# Patient Record
Sex: Female | Born: 1960
Health system: Southern US, Community
[De-identification: ages and names within clinical notes are randomized; demographics above are authoritative.]

## PROBLEM LIST (undated history)

## (undated) DIAGNOSIS — T7840XA Allergy, unspecified, initial encounter: Secondary | ICD-10-CM

## (undated) DIAGNOSIS — E785 Hyperlipidemia, unspecified: Secondary | ICD-10-CM

## (undated) DIAGNOSIS — Z9889 Other specified postprocedural states: Secondary | ICD-10-CM

## (undated) DIAGNOSIS — I10 Essential (primary) hypertension: Secondary | ICD-10-CM

## (undated) DIAGNOSIS — M1990 Unspecified osteoarthritis, unspecified site: Secondary | ICD-10-CM

## (undated) DIAGNOSIS — R112 Nausea with vomiting, unspecified: Secondary | ICD-10-CM

## (undated) DIAGNOSIS — K219 Gastro-esophageal reflux disease without esophagitis: Secondary | ICD-10-CM

## (undated) DIAGNOSIS — J449 Chronic obstructive pulmonary disease, unspecified: Secondary | ICD-10-CM

## (undated) DIAGNOSIS — D649 Anemia, unspecified: Secondary | ICD-10-CM

## (undated) HISTORY — DX: Chronic obstructive pulmonary disease, unspecified: J44.9

## (undated) HISTORY — DX: Morbid (severe) obesity due to excess calories: E66.01

## (undated) HISTORY — DX: Allergy, unspecified, initial encounter: T78.40XA

## (undated) HISTORY — DX: Other specified postprocedural states: Z98.890

## (undated) HISTORY — DX: Anemia, unspecified: D64.9

## (undated) HISTORY — PX: UPPER GASTROINTESTINAL ENDOSCOPY: SHX188

## (undated) HISTORY — DX: Gastro-esophageal reflux disease without esophagitis: K21.9

## (undated) HISTORY — DX: Hyperlipidemia, unspecified: E78.5

## (undated) HISTORY — DX: Nausea with vomiting, unspecified: R11.2

## (undated) HISTORY — DX: Unspecified osteoarthritis, unspecified site: M19.90

## (undated) HISTORY — PX: COLONOSCOPY: SHX174

## (undated) HISTORY — DX: Essential (primary) hypertension: I10

---

## 1979-01-27 HISTORY — PX: MULTIPLE TOOTH EXTRACTIONS: SHX2053

## 1988-01-27 HISTORY — PX: TUBAL LIGATION: SHX77

## 1998-12-05 ENCOUNTER — Other Ambulatory Visit: Admission: RE | Admit: 1998-12-05 | Discharge: 1998-12-05 | Payer: Self-pay | Admitting: Internal Medicine

## 1999-12-01 ENCOUNTER — Ambulatory Visit (HOSPITAL_COMMUNITY): Admission: RE | Admit: 1999-12-01 | Discharge: 1999-12-01 | Payer: Self-pay | Admitting: Internal Medicine

## 1999-12-01 ENCOUNTER — Encounter: Payer: Self-pay | Admitting: Internal Medicine

## 2000-12-10 ENCOUNTER — Encounter: Payer: Self-pay | Admitting: Internal Medicine

## 2000-12-10 ENCOUNTER — Ambulatory Visit (HOSPITAL_COMMUNITY): Admission: RE | Admit: 2000-12-10 | Discharge: 2000-12-10 | Payer: Self-pay | Admitting: Internal Medicine

## 2000-12-14 ENCOUNTER — Other Ambulatory Visit: Admission: RE | Admit: 2000-12-14 | Discharge: 2000-12-14 | Payer: Self-pay | Admitting: Internal Medicine

## 2001-02-17 ENCOUNTER — Encounter: Payer: Self-pay | Admitting: Internal Medicine

## 2001-02-17 ENCOUNTER — Ambulatory Visit (HOSPITAL_COMMUNITY): Admission: RE | Admit: 2001-02-17 | Discharge: 2001-02-17 | Payer: Self-pay | Admitting: Internal Medicine

## 2001-05-06 ENCOUNTER — Encounter: Payer: Self-pay | Admitting: Internal Medicine

## 2001-05-06 ENCOUNTER — Ambulatory Visit (HOSPITAL_COMMUNITY): Admission: RE | Admit: 2001-05-06 | Discharge: 2001-05-06 | Payer: Self-pay | Admitting: Internal Medicine

## 2004-01-28 ENCOUNTER — Emergency Department (HOSPITAL_COMMUNITY): Admission: EM | Admit: 2004-01-28 | Discharge: 2004-01-28 | Payer: Self-pay | Admitting: Emergency Medicine

## 2006-01-10 ENCOUNTER — Emergency Department (HOSPITAL_COMMUNITY): Admission: EM | Admit: 2006-01-10 | Discharge: 2006-01-10 | Payer: Self-pay | Admitting: Emergency Medicine

## 2006-05-31 ENCOUNTER — Emergency Department (HOSPITAL_COMMUNITY): Admission: EM | Admit: 2006-05-31 | Discharge: 2006-05-31 | Payer: Self-pay | Admitting: Emergency Medicine

## 2006-11-10 ENCOUNTER — Ambulatory Visit: Payer: Self-pay | Admitting: *Deleted

## 2006-11-10 ENCOUNTER — Observation Stay (HOSPITAL_COMMUNITY): Admission: EM | Admit: 2006-11-10 | Discharge: 2006-11-10 | Payer: Self-pay | Admitting: Emergency Medicine

## 2006-11-11 ENCOUNTER — Encounter (HOSPITAL_COMMUNITY): Admission: RE | Admit: 2006-11-11 | Discharge: 2006-11-12 | Payer: Self-pay | Admitting: Interventional Cardiology

## 2007-03-07 ENCOUNTER — Ambulatory Visit (HOSPITAL_COMMUNITY): Admission: RE | Admit: 2007-03-07 | Discharge: 2007-03-07 | Payer: Self-pay | Admitting: Internal Medicine

## 2007-05-18 ENCOUNTER — Emergency Department (HOSPITAL_COMMUNITY): Admission: EM | Admit: 2007-05-18 | Discharge: 2007-05-18 | Payer: Self-pay | Admitting: Emergency Medicine

## 2008-03-09 ENCOUNTER — Ambulatory Visit (HOSPITAL_COMMUNITY): Admission: RE | Admit: 2008-03-09 | Discharge: 2008-03-09 | Payer: Self-pay | Admitting: Internal Medicine

## 2008-12-30 ENCOUNTER — Emergency Department (HOSPITAL_COMMUNITY): Admission: EM | Admit: 2008-12-30 | Discharge: 2008-12-30 | Payer: Self-pay | Admitting: Family Medicine

## 2009-03-11 ENCOUNTER — Ambulatory Visit (HOSPITAL_COMMUNITY): Admission: RE | Admit: 2009-03-11 | Discharge: 2009-03-11 | Payer: Self-pay | Admitting: Internal Medicine

## 2009-10-14 ENCOUNTER — Ambulatory Visit (HOSPITAL_COMMUNITY): Admission: RE | Admit: 2009-10-14 | Discharge: 2009-10-14 | Payer: Self-pay | Admitting: Gastroenterology

## 2009-11-06 ENCOUNTER — Ambulatory Visit (HOSPITAL_COMMUNITY): Admission: RE | Admit: 2009-11-06 | Discharge: 2009-11-06 | Payer: Self-pay | Admitting: Gastroenterology

## 2010-02-13 ENCOUNTER — Other Ambulatory Visit (HOSPITAL_COMMUNITY): Payer: Self-pay | Admitting: Internal Medicine

## 2010-02-13 DIAGNOSIS — Z0000 Encounter for general adult medical examination without abnormal findings: Secondary | ICD-10-CM

## 2010-02-13 DIAGNOSIS — Z1231 Encounter for screening mammogram for malignant neoplasm of breast: Secondary | ICD-10-CM

## 2010-02-15 ENCOUNTER — Encounter: Payer: Self-pay | Admitting: Internal Medicine

## 2010-02-16 ENCOUNTER — Encounter: Payer: Self-pay | Admitting: Internal Medicine

## 2010-03-11 ENCOUNTER — Ambulatory Visit (HOSPITAL_COMMUNITY): Payer: Self-pay

## 2010-03-11 ENCOUNTER — Ambulatory Visit (HOSPITAL_COMMUNITY): Admission: RE | Admit: 2010-03-11 | Payer: Self-pay | Source: Home / Self Care | Admitting: Internal Medicine

## 2010-03-13 ENCOUNTER — Ambulatory Visit (HOSPITAL_COMMUNITY): Admission: RE | Admit: 2010-03-13 | Payer: Self-pay | Source: Ambulatory Visit

## 2010-03-18 ENCOUNTER — Ambulatory Visit (HOSPITAL_COMMUNITY)
Admission: RE | Admit: 2010-03-18 | Discharge: 2010-03-18 | Disposition: A | Discharge status: Home / Self Care | Payer: 59 | Source: Ambulatory Visit | Attending: Internal Medicine | Admitting: Internal Medicine

## 2010-03-18 DIAGNOSIS — Z1231 Encounter for screening mammogram for malignant neoplasm of breast: Secondary | ICD-10-CM | POA: Insufficient documentation

## 2010-06-10 NOTE — H&P (Signed)
Marissa Cox, Marissa Cox             ACCOUNT NO.:  [id number]   MEDICAL RECORD NO.:  [id number]          PATIENT TYPE:  OBV   LOCATION:  6522                         FACILITY:  [facility]   PHYSICIAN:  Audery Amel, MD    DATE OF BIRTH:  20-Apr-1960   DATE OF ADMISSION:  11/10/2006  DATE OF DISCHARGE:  11/10/2006                              HISTORY & PHYSICAL   CHIEF COMPLAINT:  Chest pain.   HISTORY OF PRESENT ILLNESS:  The patient is a 50 year old African  American female with a history of hypertension who presents to the  emergency department for evaluation of chest pain.  The patient states  that the symptoms began this afternoon after she left work.  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  This afternoon, she  characterized the onset of this as a very sharp chest pain located in  the substernal region.  She denied any radiation to the left arm, neck,  jaw or back.  The patient stated that she has had similar symptoms in  the past when she has had muscle strains; however, this seemed to be  somewhat more severe.  She presented to the emergency department for  further evaluation.  EKG was obtained which revealed normal sinus rhythm  with no evidence of acute injury or ischemia.  Her point of care cardiac  biomarkers were negative as well as her serum cardiac biomarkers.  The  pain spontaneously improved, and is currently 2/10.  She does note that  the pain is worse with deep inspiration, and with moving of her left arm  and shoulder.  Of note, there is a palpable component which does  reproduce her symptoms.  Otherwise she is without complaints.   PAST MEDICAL HISTORY:  Hypertension.   CURRENT MEDICATIONS:  1. Lisinopril 20 mg once daily.  2. Aspirin 81 mg daily.   SOCIAL HISTORY:  The patient lives in [place] with her husband.  The  patient states she smokes approximately one half-pack of  cigarettes  daily.  She has occasional alcohol.  She denies any illicit substances.   FAMILY HISTORY:  There is no history of coronary artery disease on  either side of her family.  There is a general history of hypertension.   REVIEW OF SYSTEMS:  The patient denies any PND, orthopnea, or symptoms  concerning for heart failure.  Otherwise, review of systems is negative  except as documented in the HPI.   PHYSICAL EXAMINATION:  VITAL SIGNS:  Blood pressure 100/54, heart rate  67, O2 sats are 99% on room air.  GENERAL:  The patient is obese, alert and x3, no acute distress,  pleasantly conversant.  NECK:  Supple, full range of motion, no appreciable JVD.  There is no  palpable thyromegaly or lymphadenopathy.  Carotid upstrokes are equal  and symmetric bilaterally with no audible bruits.  CHEST:  Clear to auscultation bilaterally.  There is a palpable chest  wall discomfort along the left sternal border.  CARDIOVASCULAR:  Normal S1-S2 with  no audible murmurs, rubs or gallops.  ABDOMEN:  Obese, soft, nontender, nondistended.  Positive bowel sounds.  No hepatosplenomegaly.  EXTREMITIES:  Show no clubbing, cyanosis or edema.  Peripheral pulses  are 1+ and symmetric bilaterally.  There is no rash or serrations noted  on her extremities.  NEUROLOGIC:  Examination is grossly nonfocal.  Strength and sensation  are intact throughout.  PSYCHIATRIC:  Appropriate insight and judgment.   STUDIES:  Chest X-Ray:  No acute cardiopulmonary process.   EKG normal sinus rhythm with no evidence of acute injury or ischemia.   LABORATORY DATA:  Sodium 139, potassium 3.9, chloride 104, CO2 is 28,  BUN is 17, creatinine 1.0.  point care biomarkers negative times one.  Serum cardiac biomarkers revealed Troponin I 0.02 and CK-MB of 1.5.   IMPRESSION:  1. Atypical chest pain.  2. Hypertension.   PLAN:  Will admit the patient to telemetry for further evaluation and  monitoring.  We will cycle her cardiac  biomarkers to rule out myocardial  infarction.  My suspicion of an acute coronary syndrome is very low and  given the palpable nature of her chest discomfort, suspect this is  musculoskeletal pain.  We will continue her lisinopril 20 mg once daily  for hypertension as well as aspirin 81 mg daily.  If the patient rules  out for myocardial infarction, the need for further risk stratification  can be determined at that time.      Audery Amel, MD  Electronically Signed     SHG/MEDQ  D:  11/10/2006  T:  11/10/2006  Job:  [id number]

## 2010-06-13 NOTE — Consult Note (Signed)
NAMEJANEANE, COZART NO.:  [id number]   MEDICAL RECORD NO.:  [id number]          PATIENT TYPE:  EMS   LOCATION:  MAJO                         FACILITY:  [facility]   PHYSICIAN:  Lonia Blood, M.D.DATE OF BIRTH:  January 16, 1961   DATE OF CONSULTATION:  01/10/2006  DATE OF DISCHARGE:                                 CONSULTATION   PRIMARY CARE PHYSICIAN:  Merlene Laughter. Renae Gloss, M.D.   REASON FOR CONSULTATION:  Chest pain.   HISTORY OF PRESENT ILLNESS:  Ms. Delecia Vastine is a very pleasant 49-  year-old female with no history of coronary artery disease, no personal  history of diabetes, and no significant family history of early coronary  artery disease.  She is a pack-a-day smoker, is obese, has unknown lipid  status, and is hypertensive.  She was in her usual state of health until  yesterday evening around 5 o'clock.  She had been up working about the  house.  She sat on the couch to relax.  Shortly after sitting down, she  began to experience midsternal stabbing, sharp chest pain.  This was  worse with deep breaths and movements and at times made her catch her  breath.  There was no diaphoresis.  There was no dyspnea on exertion  and no exertional component to her pain whatsoever.  The pain at times  would radiate into the right shoulder as well as the left shoulder.  The  pain was constant after its onset and has not resolved since.  Because  of the severity of her pain and the fact that she had never experienced  pain like this, the patient presented to the emergency room for  evaluation.  In the emergency room, an EKG has revealed normal sinus  rhythm with no acute changes.  Point-of-care markers have been negative.  A D-dimer was negative.  The patient's pain is reproducible to palpation  over the chest.  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   REVIEW OF SYSTEMS:  Comprehensive review of systems is unremarkable,  with the exception of the multiple positive elements noted in the  history of present illness above.   PAST MEDICAL HISTORY:  1. Hypertension.  2. Tobacco abuse with a minimum of one-pack-per day since 50 years      old.   MEDICATIONS:  Lisinopril 10 mg daily.   ALLERGIES:  NO KNOWN DRUG ALLERGIES.   FAMILY HISTORY:  The patient's mother died of cancer of the bladder at  age 53.  The patient's father is alive and well at 58 with diabetes.  The patient has five siblings total, none of whom have a history of  coronary artery disease.   SOCIAL HISTORY:  The patient lives in [place].  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  She lives with  her husband.  Her alcohol intake is negligible.   DATA REVIEWED:  D-dimer is less than 0.22.  pH 7.37, PCO2 47.  Sodium,  potassium, chloride,  bicarb, BUN, creatinine, and serum glucose are  normal. Myoglobin, CK-MB, and troponin I are all negative.   12-lead EKG reveals normal sinus rhythm at 75 beats per minute with no  acute ST or T wave changes.  Chest x-ray reveals no acute disease.   PHYSICAL EXAMINATION:  VITAL SIGNS:  Temperature 98.3, blood pressure  147/69, heart rate 80, respiratory rate 20, O2 saturation 100% on room  air.  GENERAL:  Well-developed, well-nourished, obese female in no acute  respiratory distress.  HEENT:  Normocephalic, atraumatic.  Pupils equal, round, reactive to  light and accommodation.  Extraocular muscles intact.  OC/OP  clear.  NECK:  No JVD.  LUNGS:  Clear to auscultation bilaterally without wheezes or rhonchi.  CARDIOVASCULAR:  Regular rate and rhythm without murmurs, gallops, or  rubs.  Normal S1 and S2.  ABDOMEN:  Nontender, nondistended, soft.  Bowel sounds present.  No  hepatosplenomegaly.  No rebound.  No ascites.  EXTREMITIES:  No significant clubbing, cyanosis, or edema in bilateral   lower extremities.  NEUROLOGIC:  5/5 strength in bilateral upper and lower extremities.  Intact sensation to touch throughout.  Alert and oriented x4.  Cranial  nerves II-XII are intact bilaterally.  MUSCULOSKELETAL:  There is reproducible pain with palpation over the  sternum.  With palpation, this sharp, stabbing pain radiates into the  right and left shoulders simultaneously.   IMPRESSION AND PLAN:  1. Musculoskeletal chest pain.  Ms. Aisling Emigh pain does not in      any way resemble angina.  She has a normal electrocardiogram and      normal point-of-care markers x1.  Her cardiac risk factors include      hypertension, tobacco abuse, and obesity.  She has an unknown lipid      status.  She does not have a significant family history, and she is      not diabetic.  In the absence of significant electrocardiographic      findings, positive enzymes, or history consistent with angina, I do      not feel that there is anything to be offered to Ms. Seppala by      admitted her to the hospital for risk stratification.  Her history      is also not consistent with esophageal rupture or perforated peptic      ulcer.  There is no free air in the diaphragm.  With a completely      negative D-dimer and a history not consistent with a deep venous      thrombosis or pulmonary embolism, this is also highly unlikely.      What seems to be most likely is the fact that the patient has      suffered a musculoskeletal strain/sprain of the intercostal muscles      and possibly even at the rib and sternum junction.  I have advised      her that she should refrain from repetitive motions and lifting as      required by her work for a period of at least 3 to 5 days.  She is      advised to follow up with Dr. Renae Gloss in the outpatient setting to      ultimately be cleared to return to work.  I will place her on      Motrin at 200 mg t.i.d. in an attempt to actually decrease     inflammation.  I have  explained to her that she can  only take this      for 5 days straight due to the possibility for gastrointestinal      upset as well as renal insufficiency.  I have advised her that she      should take it with food with each dose.  I have also prescribed      for her Percocet 5/325 to be taken 1 to 2 every 6 hours as needed      for severe pain.  I have explained to her that this will not in      fact assist with recovery of the injured muscle/tissue but that it      will simply mask the pain and hopefully allow her to sleep or rest.  2. Obesity.  I have discussed with Ms. Hise the advisability of a      weight loss program.  3. Tobacco abuse.  I have counseled Ms. Griffeth as to the multiple      deleterious effects of ongoing tobacco abuse.  I have advised her      to discontinue use immediately.  4. Hypertension.  The patient's blood pressure is reasonably      controlled given the current clinical situation.  I will not adjust      her medications.   It has been my pleasure to see Ms. Fly in the emergency room.  I  do not feel that she needs inpatient hospitalization.  I am discharging  her home to follow up with Dr. Andi Devon in 3 to 5 days for a  recheck of musculoskeletal chest pain.      Lonia Blood, M.D.  Electronically Signed     JTM/MEDQ  D:  01/10/2006  T:  01/10/2006  Job:  [id number]   cc:   Merlene Laughter. Renae Gloss, M.D.

## 2010-10-21 LAB — POCT URINALYSIS DIP (DEVICE)
Glucose, UA: NEGATIVE
Operator id: 239701
Protein, ur: 30 — AB
Specific Gravity, Urine: 1.015
pH: 6.5

## 2010-11-05 LAB — BASIC METABOLIC PANEL
BUN: 16
GFR calc Af Amer: >60
Glucose, Bld: 89
Potassium: 3.7

## 2010-11-05 LAB — DIFFERENTIAL
Basophils Relative: 0
Eosinophils Absolute: 0.1
Eosinophils Relative: 2
Lymphocytes Relative: 32
Lymphs Abs: 1.7
Monocytes Absolute: 0.3
Monocytes Relative: 6
Neutro Abs: 3.3
Neutrophils Relative %: 60

## 2010-11-05 LAB — POCT CARDIAC MARKERS
CKMB, poc: <1 — ABNORMAL LOW
Myoglobin, poc: 83.7
Operator id: 277751
Troponin i, poc: <0.05

## 2010-11-05 LAB — CBC
HCT: 33.3 — ABNORMAL LOW
MCV: 74.3 — ABNORMAL LOW
Platelets: 238
RDW: 13.4

## 2010-11-05 LAB — LIPID PANEL: HDL: 39 — ABNORMAL LOW

## 2010-11-05 LAB — CK TOTAL AND CKMB (NOT AT ARMC)
CK, MB: 1.3
Relative Index: 0.8
Total CK: 163

## 2010-11-05 LAB — TROPONIN I: Troponin I: 0.02

## 2010-11-06 LAB — I-STAT 8, (EC8 V) (CONVERTED LAB)
Acid-Base Excess: 3 — ABNORMAL HIGH
BUN: 17
Bicarbonate: 28.3 — ABNORMAL HIGH
Chloride: 104
Operator id: 272551
TCO2: 30

## 2010-11-06 LAB — CBC
HCT: 34.6 — ABNORMAL LOW
Hemoglobin: 10.9 — ABNORMAL LOW
MCV: 74.1 — ABNORMAL LOW
RBC: 4.67
WBC: 7.4

## 2010-11-06 LAB — CK TOTAL AND CKMB (NOT AT ARMC)
CK, MB: 1.5
Relative Index: 0.8

## 2010-11-06 LAB — DIFFERENTIAL
Eosinophils Relative: 1
Lymphocytes Relative: 31
Neutrophils Relative %: 61

## 2010-11-06 LAB — POCT CARDIAC MARKERS
Myoglobin, poc: 58.9
Myoglobin, poc: 74.6
Operator id: 277751

## 2010-11-06 LAB — D-DIMER, QUANTITATIVE: D-Dimer, Quant: <0.22

## 2010-11-06 LAB — POCT I-STAT CREATININE: Operator id: 272551

## 2011-02-27 ENCOUNTER — Other Ambulatory Visit (HOSPITAL_COMMUNITY): Payer: Self-pay | Admitting: Internal Medicine

## 2011-02-27 DIAGNOSIS — Z1231 Encounter for screening mammogram for malignant neoplasm of breast: Secondary | ICD-10-CM

## 2011-03-26 ENCOUNTER — Ambulatory Visit (HOSPITAL_COMMUNITY)
Admission: RE | Admit: 2011-03-26 | Discharge: 2011-03-26 | Disposition: A | Discharge status: Home / Self Care | Payer: 59 | Source: Ambulatory Visit | Attending: Internal Medicine | Admitting: Internal Medicine

## 2011-03-26 DIAGNOSIS — Z1231 Encounter for screening mammogram for malignant neoplasm of breast: Secondary | ICD-10-CM | POA: Insufficient documentation

## 2011-08-07 ENCOUNTER — Encounter (INDEPENDENT_AMBULATORY_CARE_PROVIDER_SITE_OTHER): Payer: Self-pay | Admitting: General Surgery

## 2011-08-07 ENCOUNTER — Ambulatory Visit (INDEPENDENT_AMBULATORY_CARE_PROVIDER_SITE_OTHER): Payer: Commercial Managed Care - PPO | Admitting: General Surgery

## 2011-08-07 ENCOUNTER — Ambulatory Visit (INDEPENDENT_AMBULATORY_CARE_PROVIDER_SITE_OTHER): Payer: 59 | Admitting: General Surgery

## 2011-08-07 DIAGNOSIS — I10 Essential (primary) hypertension: Secondary | ICD-10-CM

## 2011-08-07 DIAGNOSIS — Z6841 Body Mass Index (BMI) 40.0 and over, adult: Secondary | ICD-10-CM

## 2011-08-07 NOTE — Progress Notes (Signed)
Patient ID: Marissa Cox, female   DOB: Jun 23, 1960, 51 y.o.   MRN: [id number]  Chief Complaint  Patient presents with  . Bariatric Pre-op    sleeve initial    HPI Marissa Cox is a 51 y.o. female.  This patient has a BMI of 45 with comorbidities of hypertension, hyperlipidemia, and bronchitis and presents for her initial weight loss surgery evaluation. She has a tender information session and is most interested in the sleeve gastrectomy. She is troubled with wait for a while and has tried several diets including Weight Watchers, Nutrisystem, and physician's weight loss and has always regained the weight. She says that she is walking now about 30 minutes per day as well as doing the Wii Fit. She denies any reflux. She does have a history of smoking but has arty consult with her primary physician regarding this and has not smoked since July 2 but is currently on nicotine patches. HPI  Past Medical History  Diagnosis Date  . Hyperlipidemia   . Hypertension     Past Surgical History  Procedure Date  . Tubal ligation 1990    Family History  Problem Relation Age of Onset  . Cancer Mother     bladder  . Cancer Maternal Aunt     breast  . Cancer Paternal Aunt     breast  . Cancer Paternal Aunt     breast  . Cancer Paternal Aunt     breast  . Cancer Maternal Aunt     ovarian    Social History History  Substance Use Topics  . Smoking status: Former Games developer  . Smokeless tobacco: Former Neurosurgeon    Quit date: 08/07/2011  . Alcohol Use: No    No Known Allergies  Current Outpatient Prescriptions  Medication Sig Dispense Refill  . albuterol (PROVENTIL) (2.5 MG/3ML) 0.083% nebulizer solution Take 2.5 mg by nebulization every 6 (six) hours as needed.      . cholecalciferol (VITAMIN D) 1000 UNITS tablet Take 2,000 Units by mouth daily.      Marland Kitchen ezetimibe-simvastatin (VYTORIN) 10-20 MG per tablet Take 1 tablet by mouth at bedtime.      Marland Kitchen levocetirizine (XYZAL) 5 MG tablet Take 5  mg by mouth every evening.      Marland Kitchen lisinopril-hydrochlorothiazide (PRINZIDE,ZESTORETIC) 20-12.5 MG per tablet Take 1 tablet by mouth daily.      . nicotine (NICODERM CQ - DOSED IN MG/24 HOURS) 21 mg/24hr patch Place 1 patch onto the skin daily.      . nicotine (NICOTROL) 10 MG inhaler Inhale 1 puff into the lungs as needed.      Marland Kitchen albuterol (PROVENTIL HFA;VENTOLIN HFA) 108 (90 BASE) MCG/ACT inhaler Inhale 2 puffs into the lungs as needed.        Review of Systems Review of Systems All other review of systems negative or noncontributory except as stated in the HPI  Blood pressure 145/86, pulse 75, temperature 97.5 F (36.4 C), temperature source Temporal, resp. rate 16, height 5\' 7"  (1.702 m), weight 293 lb (132.904 kg), SpO2 98.00%.  Physical Exam Physical Exam Physical Exam  Nursing note and vitals reviewed. Constitutional: She is oriented to person, place, and time. She appears well-developed and well-nourished. No distress.  HENT:  Head: Normocephalic and atraumatic.  Mouth/Throat: No oropharyngeal exudate.  Eyes: Conjunctivae and EOM are normal. Pupils are equal, round, and reactive to light. Right eye exhibits no discharge. Left eye exhibits no discharge. No scleral icterus.  Neck: Normal range of  motion. Neck supple. No tracheal deviation present.  Cardiovascular: Normal rate, regular rhythm, normal heart sounds and intact distal pulses.   Pulmonary/Chest: Effort normal and breath sounds normal. No stridor. No respiratory distress. She has no wheezes.  Abdominal: Soft. Bowel sounds are normal. She exhibits no distension and no mass. There is no tenderness. There is no rebound and no guarding.  Musculoskeletal: Normal range of motion. She exhibits no edema and no tenderness.  Neurological: She is alert and oriented to person, place, and time.  Skin: Skin is warm and dry. No rash noted. She is not diaphoretic. No erythema. No pallor.  Psychiatric: She has a normal mood and affect.  Her behavior is normal. Judgment and thought content normal.    Data Reviewed   Assessment    Morbid obesity with a BMI of 45 and comorbidities of hypertension, hyperlipidemia, and bronchitis She has a history of smoking but is currently in the process of quitting. I again explained our policy of not performing surgery on current smokers and she agreed to continue with her smoking cessation. We discussed the increased risks of complications such as leaks in patients that are smoking. We also discussed all of the nonsurgical and surgical weight loss options including the. Laband, sleeve gastrectomy, and Roux-en-Y gastric bypass. She is most interested in the sleeve gastrectomy. The risks of infection, bleeding, pain, scarring, weight regain, too little or too much weight loss, vitamin deficiencies and need for lifelong vitamin supplementation, hair loss, need for protein supplementation, leaks, stricture, reflux, food intolerance, need for reoperation and conversion to roux Y gastric bypass, need for open surgery, injury to spleen or surrounding structures, DVT's, PE, and death again discussed with the patient and the patient expressed understanding and desires to proceed with laparoscopic vertical sleeve gastrectomy, possible open, intraoperative endoscopy.     Plan    We will start her on her preoperative workup including laboratory studies, upper GI, psychology and nutrition evaluations. She says that she is current on her mammogram and Pap smear and other preventive maintenance       Marissa Cox DAVID 08/07/2011, 1:05 PM

## 2011-08-10 LAB — CBC WITH DIFFERENTIAL/PLATELET
Basophils Absolute: 0 10*3/uL (ref 0.0–0.2)
Eosinophils Absolute: 0.1 10*3/uL (ref 0.0–0.4)
Immature Grans (Abs): 0 10*3/uL (ref 0.0–0.1)
Immature Granulocytes: 0 % (ref 0–2)
MCH: 22.7 pg — ABNORMAL LOW (ref 26.6–33.0)
MCHC: 30.6 g/dL — ABNORMAL LOW (ref 31.5–35.7)
MCV: 74 fL — ABNORMAL LOW (ref 79–97)
Monocytes Absolute: 0.4 10*3/uL (ref 0.1–1.0)
Neutrophils Relative %: 50 % (ref 40–74)
RDW: 13.7 % (ref 12.3–15.4)

## 2011-08-10 LAB — COMPREHENSIVE METABOLIC PANEL
ALT: 22 IU/L (ref 0–32)
AST: 23 IU/L (ref 0–40)
Albumin/Globulin Ratio: 1.3 (ref 1.1–2.5)
CO2: 26 mmol/L (ref 19–28)
Calcium: 8.9 mg/dL (ref 8.7–10.2)
GFR calc non Af Amer: 77 mL/min/{1.73_m2} (ref >59)
Potassium: 3.8 mmol/L (ref 3.5–5.2)
Sodium: 141 mmol/L (ref 134–144)

## 2011-08-10 LAB — LIPID PANEL: VLDL Cholesterol Cal: 14 mg/dL (ref 5–40)

## 2011-08-11 LAB — H. PYLORI ANTIBODY, IGG: H Pylori IgG: <0.9 U/mL (ref 0.0–0.8)

## 2011-08-26 ENCOUNTER — Encounter: Payer: 59 | Attending: General Surgery | Admitting: *Deleted

## 2011-08-26 ENCOUNTER — Encounter: Payer: Self-pay | Admitting: *Deleted

## 2011-08-26 DIAGNOSIS — Z713 Dietary counseling and surveillance: Secondary | ICD-10-CM | POA: Insufficient documentation

## 2011-08-26 DIAGNOSIS — Z01818 Encounter for other preprocedural examination: Secondary | ICD-10-CM | POA: Insufficient documentation

## 2011-08-26 NOTE — Patient Instructions (Signed)
   Follow Pre-Op Nutrition Goals to prepare for Gastric Sleeve Surgery.   Call [facility] at [phone] once you have been given your surgery date to enrolled in the Pre-Op Nutrition Class. You will need to attend this nutrition class 3-4 weeks prior to your surgery.  

## 2011-08-26 NOTE — Progress Notes (Signed)
  Pre-Op Assessment Visit:  Pre-Operative Gastric Sleeve Surgery  Medical Nutrition Therapy:  Appt start time: 0830   End time:  0930.  Patient was seen on 08/26/2011 for Pre-Operative Gastric Sleeve Nutrition Assessment. Assessment and letter of approval faxed to [facility] Bariatric Surgery Program coordinator on 08/26/2011.  Approval letter sent to [facility] Scan center and will be available in the chart under the media tab.  TANITA  BODY COMP RESULTS  08/26/11   %Fat 51.8%   Fat Mass (lbs) 154.5   Fat Free Mass (lbs) 144.0   Total Body Water (lbs) 105.5   Handouts given during visit include:  Pre-Op Goals   Bariatric Surgery Protein Shakes  Patient to call for Pre-Op and Post-Op Nutrition Education at the [facility] when surgery is scheduled.

## 2011-09-02 ENCOUNTER — Ambulatory Visit (HOSPITAL_COMMUNITY)
Admission: RE | Admit: 2011-09-02 | Discharge: 2011-09-02 | Disposition: A | Discharge status: Home / Self Care | Payer: 59 | Source: Ambulatory Visit | Attending: General Surgery | Admitting: General Surgery

## 2011-09-02 DIAGNOSIS — Z6841 Body Mass Index (BMI) 40.0 and over, adult: Secondary | ICD-10-CM | POA: Insufficient documentation

## 2011-09-02 DIAGNOSIS — I10 Essential (primary) hypertension: Secondary | ICD-10-CM | POA: Insufficient documentation

## 2011-09-02 DIAGNOSIS — E785 Hyperlipidemia, unspecified: Secondary | ICD-10-CM | POA: Insufficient documentation

## 2011-11-12 ENCOUNTER — Other Ambulatory Visit (INDEPENDENT_AMBULATORY_CARE_PROVIDER_SITE_OTHER): Payer: Self-pay

## 2011-11-12 DIAGNOSIS — Z01812 Encounter for preprocedural laboratory examination: Secondary | ICD-10-CM

## 2011-11-12 DIAGNOSIS — Z720 Tobacco use: Secondary | ICD-10-CM

## 2011-11-13 ENCOUNTER — Encounter (INDEPENDENT_AMBULATORY_CARE_PROVIDER_SITE_OTHER): Payer: Self-pay

## 2011-11-13 ENCOUNTER — Telehealth (INDEPENDENT_AMBULATORY_CARE_PROVIDER_SITE_OTHER): Payer: Self-pay

## 2011-11-13 NOTE — Telephone Encounter (Signed)
Patient will have her Nicotine test today, she also reports that she's no longer wearing the nicotine patch.

## 2011-11-13 NOTE — Telephone Encounter (Signed)
Lab orders for Nicotine Testing mailed to patient.  Per Dr. Biagio Quint patient must have Negative results or patient Weight Loss Surgery will be cancelled.

## 2011-11-16 LAB — NICOTINE/COTININE METABOLITES: Cotinine: <10 ng/mL

## 2011-11-20 ENCOUNTER — Other Ambulatory Visit (INDEPENDENT_AMBULATORY_CARE_PROVIDER_SITE_OTHER): Payer: Self-pay | Admitting: General Surgery

## 2011-11-20 DIAGNOSIS — E669 Obesity, unspecified: Secondary | ICD-10-CM

## 2011-12-03 ENCOUNTER — Encounter: Payer: 59 | Attending: General Surgery | Admitting: *Deleted

## 2011-12-03 ENCOUNTER — Ambulatory Visit: Payer: 59

## 2011-12-03 ENCOUNTER — Telehealth (INDEPENDENT_AMBULATORY_CARE_PROVIDER_SITE_OTHER): Payer: Self-pay

## 2011-12-03 DIAGNOSIS — Z713 Dietary counseling and surveillance: Secondary | ICD-10-CM | POA: Insufficient documentation

## 2011-12-03 DIAGNOSIS — Z01818 Encounter for other preprocedural examination: Secondary | ICD-10-CM | POA: Insufficient documentation

## 2011-12-03 NOTE — Telephone Encounter (Signed)
FMLA forms signed by Dr. Biagio Quint and faxed to [place], Human Resources @ [place] [phone].  Confirmation attached to forms and sent to medical records to be scanned in patient chart.

## 2011-12-05 ENCOUNTER — Encounter: Payer: Self-pay | Admitting: *Deleted

## 2011-12-05 NOTE — Progress Notes (Addendum)
Bariatric Class:  Appt start time: 1730 end time:  1830.  Pre-Operative Nutrition Class  Patient was seen on 12/03/11 for Pre-Operative Bariatric Surgery Education at [facility].   Surgery date: 12/22/11 Surgery type: Gastric Sleeve Start weight at [facility]: 298.5 lbs (08/26/11) Weight today: 312.6 lbs  Samples given per MNT protocol: Bariatric Advantage Complete Multivitamin Lot # [id number]; [id number] Exp: 12/13; 06/15  Bariatric Advantage Calcium Citrate Lot # [id number] Exp:12/13  Celebrate Vitamins Multivitamin Lot # [id number] Exp: 09/14  Celebrate Vitamins Multivitamin Complete Lot # [id number] Exp: 11/14  Celebrate Vitamins Calcium Citrate Lot # [id number] Exp:09/15  Celebrate Vitamins Sublingual B12 Lot # [id number] Exp: 05/15  Corliss Marcus Protein Powder Lot # [id number] Exp: 03/15  The following the learning objective met by the patient during this course:  Identifies Pre-Op Dietary Goals and will begin 2 weeks pre-operatively  Identifies appropriate sources of fluids and proteins   States protein recommendations and appropriate sources pre and post-operatively  Identifies Post-Operative Dietary Goals and will follow for 2 weeks post-operatively  Identifies appropriate multivitamin and calcium sources  Describes the need for physical activity post-operatively and will follow MD recommendations  States when to call healthcare provider regarding medication questions or post-operative complications  Handouts given during class include:  Pre-Op Bariatric Surgery Diet Handout  Protein Shake Handout  Post-Op Bariatric Surgery Nutrition Handout  BELT Program Information Flyer  Support Group Information Flyer  [facility] Bariatric Supplements Price List  Follow-Up Plan: Patient will follow-up at [facility] 2 weeks post operatively for diet advancement per MD.

## 2011-12-05 NOTE — Patient Instructions (Signed)
Follow:   Pre-Op Diet per MD 2 weeks prior to surgery  Phase 2- Liquids (clear/full) 2 weeks after surgery  Vitamin/Mineral/Calcium guidelines for purchasing bariatric supplements  Exercise guidelines pre and post-op per MD  Follow-up at [facility] in 2 weeks post-op for diet advancement. Contact Rhylin Venters as needed with questions/concerns. 

## 2011-12-14 ENCOUNTER — Encounter (HOSPITAL_COMMUNITY): Payer: Self-pay | Admitting: Pharmacy Technician

## 2011-12-16 ENCOUNTER — Encounter (INDEPENDENT_AMBULATORY_CARE_PROVIDER_SITE_OTHER): Payer: Self-pay | Admitting: General Surgery

## 2011-12-16 ENCOUNTER — Telehealth (INDEPENDENT_AMBULATORY_CARE_PROVIDER_SITE_OTHER): Payer: Self-pay | Admitting: General Surgery

## 2011-12-16 ENCOUNTER — Ambulatory Visit (INDEPENDENT_AMBULATORY_CARE_PROVIDER_SITE_OTHER): Payer: Commercial Managed Care - PPO | Admitting: General Surgery

## 2011-12-16 ENCOUNTER — Telehealth (INDEPENDENT_AMBULATORY_CARE_PROVIDER_SITE_OTHER): Payer: Self-pay

## 2011-12-16 ENCOUNTER — Other Ambulatory Visit (INDEPENDENT_AMBULATORY_CARE_PROVIDER_SITE_OTHER): Payer: Self-pay | Admitting: General Surgery

## 2011-12-16 DIAGNOSIS — Z6841 Body Mass Index (BMI) 40.0 and over, adult: Secondary | ICD-10-CM

## 2011-12-16 MED ORDER — OXYCODONE HCL 5 MG/5ML PO SOLN: 5.0000 mg | ORAL | Status: DC | PRN

## 2011-12-16 MED ORDER — ONDANSETRON 4 MG PO TBDP: 4.0000 mg | ORAL_TABLET | Freq: Three times a day (TID) | ORAL | Status: DC | PRN

## 2011-12-16 NOTE — Telephone Encounter (Signed)
Pt calling to ask about postop meds.  Her planned discharge will fall on Thanksgiving Day, so pt is asking if she can get her medicine filled earlier.  She uses the [facility] OP pharmacy:  [phone].  Please let her know.

## 2011-12-16 NOTE — Telephone Encounter (Signed)
Patient notified that her post op prescription for pain medication will be at check in for her to pick up.

## 2011-12-16 NOTE — Progress Notes (Signed)
Patient ID: Marissa Cox, female   DOB: 06/04/1960, 51 y.o.   MRN: [id number]  No chief complaint on file.   HPI Marissa Cox is a 51 y.o. female.  This patient presents for her preoperative evaluation for vertical sleeve gastrectomy. She has a BMI of 45 with comorbidities of hypertension, hyperlipidemia, and bronchitis and is interested in sleeve gastrectomy. Since her initial visit she has completely stop smoking and has been smoke-free for 5 months and she feels well. She has been walking and doing her we fit it has lost about 9 pounds on the preoperative diet HPI  Past Medical History  Diagnosis Date  . Hyperlipidemia   . Hypertension     Past Surgical History  Procedure Date  . Tubal ligation 1990    Family History  Problem Relation Age of Onset  . Cancer Mother     bladder  . Cancer Maternal Aunt     breast  . Cancer Paternal Aunt     breast  . Cancer Paternal Aunt     breast  . Cancer Paternal Aunt     breast  . Cancer Maternal Aunt     ovarian    Social History History  Substance Use Topics  . Smoking status: Former Smoker  . Smokeless tobacco: Former User    Quit date: 08/07/2011  . Alcohol Use: No    No Known Allergies  Current Outpatient Prescriptions  Medication Sig Dispense Refill  . cholecalciferol (VITAMIN D) 1000 UNITS tablet Take 2,000 Units by mouth daily.      . ezetimibe-simvastatin (VYTORIN) 10-20 MG per tablet Take 1 tablet by mouth at bedtime.      . lisinopril-hydrochlorothiazide (PRINZIDE,ZESTORETIC) 20-12.5 MG per tablet Take 1 tablet by mouth daily before breakfast.         Review of Systems Review of Systems All other review of systems negative or noncontributory except as stated in the HPI  There were no vitals taken for this visit.  Physical Exam Physical Exam Physical Exam  Nursing note and vitals reviewed. Constitutional: She is oriented to person, place, and time. She appears well-developed and well-nourished. No  distress.  HENT:  Head: Normocephalic and atraumatic.  Mouth/Throat: No oropharyngeal exudate.  Eyes: Conjunctivae and EOM are normal. Pupils are equal, round, and reactive to light. Right eye exhibits no discharge. Left eye exhibits no discharge. No scleral icterus.  Neck: Normal range of motion. Neck supple. No tracheal deviation present.  Cardiovascular: Normal rate, regular rhythm, normal heart sounds and intact distal pulses.   Pulmonary/Chest: Effort normal and breath sounds normal. No stridor. No respiratory distress. She has no wheezes.  Abdominal: Soft. Bowel sounds are normal. She exhibits no distension and no mass. There is no tenderness. There is no rebound and no guarding.  Musculoskeletal: Normal range of motion. She exhibits no edema and no tenderness.  Neurological: She is alert and oriented to person, place, and time.  Skin: Skin is warm and dry. No rash noted. She is not diaphoretic. No erythema. No pallor.  Psychiatric: She has a normal mood and affect. Her behavior is normal. Judgment and thought content normal.    Data Reviewed   Assessment    Morbid obesity with a BMI of 45 and comorbidities of hypertension and hyperlipidemia She has completed her necessary workup and evaluation for vertical sleeve gastrectomy. She seems excited for this new change in her life and is on the preoperative diet. She has stopped smoking in her nicotine test   has been negative. We again discussed the procedure and the pros and cons and risks and benefits of vertical sleeve gastrectomy.The risks of infection, bleeding, pain, scarring, weight regain, too little or too much weight loss, vitamin deficiencies and need for lifelong vitamin supplementation, hair loss, need for protein supplementation, leaks, stricture, reflux, food intolerance, need for reoperation and conversion to roux Y gastric bypass, need for open surgery, injury to spleen or surrounding structures, DVT's, PE, and death again  discussed with the patient and the patient expressed understanding and desires to proceed with laparoscopic vertical sleeve gastrectomy, possible open, intraoperative endoscopy.     Plan    We will plan for laparoscopic vertical sleeve gastrectomy possible open next week.       Lajarvis Italiano DAVID 12/16/2011, 1:43 PM    

## 2011-12-17 ENCOUNTER — Encounter (HOSPITAL_COMMUNITY): Payer: Self-pay

## 2011-12-17 ENCOUNTER — Ambulatory Visit (HOSPITAL_COMMUNITY)
Admission: RE | Admit: 2011-12-17 | Discharge: 2011-12-17 | Disposition: A | Discharge status: Home / Self Care | Payer: 59 | Source: Ambulatory Visit | Attending: General Surgery | Admitting: General Surgery

## 2011-12-17 ENCOUNTER — Encounter (HOSPITAL_COMMUNITY)
Admission: RE | Admit: 2011-12-17 | Discharge: 2011-12-17 | Disposition: A | Discharge status: Home / Self Care | Payer: 59 | Source: Ambulatory Visit | Attending: General Surgery | Admitting: General Surgery

## 2011-12-17 ENCOUNTER — Ambulatory Visit: Payer: 59

## 2011-12-17 VITALS — BP 114/62 | HR 60 | Temp 97.6°F | Resp 18 | Ht 67.0 in | Wt 304.0 lb

## 2011-12-17 DIAGNOSIS — E669 Obesity, unspecified: Secondary | ICD-10-CM

## 2011-12-17 DIAGNOSIS — Z01812 Encounter for preprocedural laboratory examination: Secondary | ICD-10-CM | POA: Insufficient documentation

## 2011-12-17 DIAGNOSIS — Z01818 Encounter for other preprocedural examination: Secondary | ICD-10-CM | POA: Insufficient documentation

## 2011-12-17 LAB — COMPREHENSIVE METABOLIC PANEL
BUN: 27 mg/dL — ABNORMAL HIGH (ref 6–23)
CO2: 26 mEq/L (ref 19–32)
Chloride: 97 mEq/L (ref 96–112)
Creatinine, Ser: 0.91 mg/dL (ref 0.50–1.10)
GFR calc non Af Amer: 71 mL/min — ABNORMAL LOW (ref >90)
Glucose, Bld: 100 mg/dL — ABNORMAL HIGH (ref 70–99)
Total Bilirubin: 0.4 mg/dL (ref 0.3–1.2)

## 2011-12-17 LAB — CBC WITH DIFFERENTIAL/PLATELET
Eosinophils Relative: 2 % (ref 0–5)
HCT: 36.3 % (ref 36.0–46.0)
Lymphs Abs: 1.3 10*3/uL (ref 0.7–4.0)
MCV: 72 fL — ABNORMAL LOW (ref 78.0–100.0)
Monocytes Relative: 9 % (ref 3–12)
Neutro Abs: 1.9 10*3/uL (ref 1.7–7.7)
RBC: 5.04 MIL/uL (ref 3.87–5.11)
RDW: 13.4 % (ref 11.5–15.5)
WBC: 3.6 10*3/uL — ABNORMAL LOW (ref 4.0–10.5)

## 2011-12-17 LAB — SURGICAL PCR SCREEN: MRSA, PCR: NEGATIVE

## 2011-12-17 NOTE — Patient Instructions (Signed)
20 Marissa Cox  12/17/2011   Your procedure is scheduled on:  Tuesday 12-22-2011  Report to [facility] at  [phone]  Call this number if you have problems the morning of surgery: [phone]   Remember:   Do not drink liquids or  eat food:After Midnight. Monday night 12-21-2011      Take these medicines the morning of surgery with A SIP OF WATER: None   Do not wear jewelry, make-up or nail polish.  Do not wear lotions, powders, or perfumes. You may wear deodorant.  Do not shave 48 hours prior to surgery. Men may shave face and neck.  Do not bring valuables to the hospital.  Contacts, dentures or bridgework may not be worn into surgery.  Leave suitcase in the car. After surgery it may be brought to your room.  For patients admitted to the hospital, checkout time is 11:00 AM the day of discharge.   Patients discharged the day of surgery will not be allowed to drive home.  Name and phone number of your driver: Virjean Boman spouse [phone]  See [facility] Preparing for surgery sheet.   Please read over the following fact sheets that you were given: MRSA Information

## 2011-12-22 ENCOUNTER — Encounter (HOSPITAL_COMMUNITY): Payer: Self-pay | Admitting: *Deleted

## 2011-12-22 ENCOUNTER — Encounter (HOSPITAL_COMMUNITY): Payer: Self-pay | Admitting: Anesthesiology

## 2011-12-22 ENCOUNTER — Encounter (HOSPITAL_COMMUNITY)
Admission: RE | Disposition: A | Discharge status: Home / Self Care | Payer: Self-pay | Source: Ambulatory Visit | Attending: General Surgery

## 2011-12-22 ENCOUNTER — Inpatient Hospital Stay (HOSPITAL_COMMUNITY)
Admission: RE | Admit: 2011-12-22 | Discharge: 2011-12-24 | DRG: 621 | Disposition: A | Discharge status: Home / Self Care | Payer: 59 | Source: Ambulatory Visit | Attending: General Surgery | Admitting: General Surgery

## 2011-12-22 ENCOUNTER — Ambulatory Visit (HOSPITAL_COMMUNITY): Payer: 59 | Admitting: Anesthesiology

## 2011-12-22 DIAGNOSIS — I10 Essential (primary) hypertension: Secondary | ICD-10-CM

## 2011-12-22 DIAGNOSIS — E785 Hyperlipidemia, unspecified: Secondary | ICD-10-CM | POA: Diagnosis present

## 2011-12-22 DIAGNOSIS — Z79899 Other long term (current) drug therapy: Secondary | ICD-10-CM

## 2011-12-22 DIAGNOSIS — Z87891 Personal history of nicotine dependence: Secondary | ICD-10-CM

## 2011-12-22 DIAGNOSIS — E669 Obesity, unspecified: Secondary | ICD-10-CM

## 2011-12-22 DIAGNOSIS — K319 Disease of stomach and duodenum, unspecified: Secondary | ICD-10-CM | POA: Diagnosis present

## 2011-12-22 DIAGNOSIS — Z6841 Body Mass Index (BMI) 40.0 and over, adult: Secondary | ICD-10-CM

## 2011-12-22 HISTORY — PX: UPPER GI ENDOSCOPY: SHX6162

## 2011-12-22 HISTORY — PX: LAPAROSCOPIC GASTRIC SLEEVE RESECTION: SHX5895

## 2011-12-22 LAB — MISCELLANEOUS TEST

## 2011-12-22 SURGERY — GASTRECTOMY, SLEEVE, LAPAROSCOPIC
Anesthesia: General | Wound class: Clean Contaminated

## 2011-12-22 MED ORDER — LISINOPRIL 20 MG PO TABS
20.0000 mg | ORAL_TABLET | Freq: Every day | ORAL | Status: DC
  Administered 2011-12-23: 20 mg via ORAL
  Filled 2011-12-22 (×2): qty 1

## 2011-12-22 MED ORDER — KCL IN DEXTROSE-NACL 20-5-0.45 MEQ/L-%-% IV SOLN
INTRAVENOUS | Status: DC
  Administered 2011-12-22 – 2011-12-24 (×6): via INTRAVENOUS
  Filled 2011-12-22 (×8): qty 1000

## 2011-12-22 MED ORDER — HYDROMORPHONE HCL PF 1 MG/ML IJ SOLN
0.2500 mg | INTRAMUSCULAR | Status: DC | PRN
  Administered 2011-12-22 (×2): 0.5 mg via INTRAVENOUS

## 2011-12-22 MED ORDER — LISINOPRIL-HYDROCHLOROTHIAZIDE 20-12.5 MG PO TABS: 1.0000 | ORAL_TABLET | Freq: Every day | ORAL | Status: DC

## 2011-12-22 MED ORDER — HYDROMORPHONE HCL PF 1 MG/ML IJ SOLN
INTRAMUSCULAR | Status: AC
  Filled 2011-12-22: qty 1

## 2011-12-22 MED ORDER — 0.9 % SODIUM CHLORIDE (POUR BTL) OPTIME
TOPICAL | Status: DC | PRN
  Administered 2011-12-22: 1000 mL

## 2011-12-22 MED ORDER — OXYCODONE-ACETAMINOPHEN 5-325 MG/5ML PO SOLN
5.0000 mL | ORAL | Status: DC | PRN
  Administered 2011-12-23 – 2011-12-24 (×5): 5 mL via ORAL
  Filled 2011-12-22 (×5): qty 5

## 2011-12-22 MED ORDER — PROPOFOL 10 MG/ML IV BOLUS
INTRAVENOUS | Status: DC | PRN
  Administered 2011-12-22: 150 mg via INTRAVENOUS

## 2011-12-22 MED ORDER — ACETAMINOPHEN 10 MG/ML IV SOLN
INTRAVENOUS | Status: DC | PRN
  Administered 2011-12-22: 1000 mg via INTRAVENOUS

## 2011-12-22 MED ORDER — LIDOCAINE-EPINEPHRINE 1 %-1:100000 IJ SOLN
INTRAMUSCULAR | Status: DC | PRN
  Administered 2011-12-22: 17 mL

## 2011-12-22 MED ORDER — BUPIVACAINE HCL 0.25 % IJ SOLN
INTRAMUSCULAR | Status: DC | PRN
  Administered 2011-12-22: 17 mL

## 2011-12-22 MED ORDER — BUPIVACAINE HCL (PF) 0.25 % IJ SOLN
INTRAMUSCULAR | Status: AC
  Filled 2011-12-22: qty 30

## 2011-12-22 MED ORDER — ONDANSETRON HCL 4 MG/2ML IJ SOLN
INTRAMUSCULAR | Status: DC | PRN
  Administered 2011-12-22: 4 mg via INTRAVENOUS

## 2011-12-22 MED ORDER — ROCURONIUM BROMIDE 100 MG/10ML IV SOLN
INTRAVENOUS | Status: DC | PRN
  Administered 2011-12-22: 20 mg via INTRAVENOUS
  Administered 2011-12-22: 10 mg via INTRAVENOUS
  Administered 2011-12-22: 45 mg via INTRAVENOUS

## 2011-12-22 MED ORDER — MORPHINE SULFATE 2 MG/ML IJ SOLN
2.0000 mg | INTRAMUSCULAR | Status: DC | PRN
  Administered 2011-12-22 (×2): 2 mg via INTRAVENOUS
  Administered 2011-12-22 – 2011-12-23 (×6): 4 mg via INTRAVENOUS
  Filled 2011-12-22 (×3): qty 2
  Filled 2011-12-22: qty 1
  Filled 2011-12-22 (×4): qty 2

## 2011-12-22 MED ORDER — TISSEEL VH 10 ML EX KIT
PACK | CUTANEOUS | Status: DC | PRN
  Administered 2011-12-22: 10 mL

## 2011-12-22 MED ORDER — LIDOCAINE-EPINEPHRINE 1 %-1:100000 IJ SOLN
INTRAMUSCULAR | Status: AC
  Filled 2011-12-22: qty 1

## 2011-12-22 MED ORDER — SODIUM CHLORIDE 0.9 % IV SOLN
1.0000 g | INTRAVENOUS | Status: AC
  Administered 2011-12-22: 1 g via INTRAVENOUS

## 2011-12-22 MED ORDER — SODIUM CHLORIDE 0.9 % IV SOLN
INTRAVENOUS | Status: AC
  Filled 2011-12-22: qty 1

## 2011-12-22 MED ORDER — ACETAMINOPHEN 10 MG/ML IV SOLN
INTRAVENOUS | Status: AC
  Filled 2011-12-22: qty 100

## 2011-12-22 MED ORDER — UNJURY VANILLA POWDER: 2.0000 [oz_av] | Freq: Four times a day (QID) | ORAL | Status: DC

## 2011-12-22 MED ORDER — HYDROCHLOROTHIAZIDE 12.5 MG PO CAPS
12.5000 mg | ORAL_CAPSULE | Freq: Every day | ORAL | Status: DC
  Administered 2011-12-23: 12.5 mg via ORAL
  Filled 2011-12-22 (×2): qty 1

## 2011-12-22 MED ORDER — HEPARIN SODIUM (PORCINE) 5000 UNIT/ML IJ SOLN
5000.0000 [IU] | Freq: Once | INTRAMUSCULAR | Status: AC
  Administered 2011-12-22: 5000 [IU] via SUBCUTANEOUS
  Filled 2011-12-22: qty 1

## 2011-12-22 MED ORDER — ONDANSETRON HCL 4 MG/2ML IJ SOLN
4.0000 mg | INTRAMUSCULAR | Status: DC | PRN
  Administered 2011-12-22 – 2011-12-23 (×4): 4 mg via INTRAVENOUS
  Filled 2011-12-22 (×5): qty 2

## 2011-12-22 MED ORDER — PROMETHAZINE HCL 25 MG/ML IJ SOLN: 6.2500 mg | INTRAMUSCULAR | Status: DC | PRN

## 2011-12-22 MED ORDER — DEXAMETHASONE SODIUM PHOSPHATE 10 MG/ML IJ SOLN
INTRAMUSCULAR | Status: DC | PRN
  Administered 2011-12-22: 10 mg via INTRAVENOUS

## 2011-12-22 MED ORDER — PROMETHAZINE HCL 25 MG/ML IJ SOLN
25.0000 mg | Freq: Four times a day (QID) | INTRAMUSCULAR | Status: DC | PRN
  Administered 2011-12-22: 25 mg via INTRAMUSCULAR
  Filled 2011-12-22: qty 1

## 2011-12-22 MED ORDER — GLYCOPYRROLATE 0.2 MG/ML IJ SOLN
INTRAMUSCULAR | Status: DC | PRN
  Administered 2011-12-22: .8 mg via INTRAVENOUS

## 2011-12-22 MED ORDER — LACTATED RINGERS IV SOLN
INTRAVENOUS | Status: DC | PRN
  Administered 2011-12-22 (×3): via INTRAVENOUS

## 2011-12-22 MED ORDER — UNJURY CHOCOLATE CLASSIC POWDER: 2.0000 [oz_av] | Freq: Four times a day (QID) | ORAL | Status: DC

## 2011-12-22 MED ORDER — FENTANYL CITRATE 0.05 MG/ML IJ SOLN
INTRAMUSCULAR | Status: DC | PRN
  Administered 2011-12-22 (×2): 50 ug via INTRAVENOUS
  Administered 2011-12-22: 100 ug via INTRAVENOUS

## 2011-12-22 MED ORDER — ACETAMINOPHEN 160 MG/5ML PO SOLN: 650.0000 mg | ORAL | Status: DC | PRN

## 2011-12-22 MED ORDER — SUCCINYLCHOLINE CHLORIDE 20 MG/ML IJ SOLN
INTRAMUSCULAR | Status: DC | PRN
  Administered 2011-12-22: 100 mg via INTRAVENOUS

## 2011-12-22 MED ORDER — UNJURY CHICKEN SOUP POWDER: 2.0000 [oz_av] | Freq: Four times a day (QID) | ORAL | Status: DC

## 2011-12-22 MED ORDER — MIDAZOLAM HCL 5 MG/5ML IJ SOLN
INTRAMUSCULAR | Status: DC | PRN
  Administered 2011-12-22: 2 mg via INTRAVENOUS

## 2011-12-22 MED ORDER — ENOXAPARIN SODIUM 40 MG/0.4ML ~~LOC~~ SOLN
40.0000 mg | SUBCUTANEOUS | Status: DC
  Administered 2011-12-23: 40 mg via SUBCUTANEOUS
  Filled 2011-12-22 (×3): qty 0.4

## 2011-12-22 MED ORDER — TISSEEL VH 10 ML EX KIT
PACK | CUTANEOUS | Status: AC
  Filled 2011-12-22: qty 1

## 2011-12-22 MED ORDER — NEOSTIGMINE METHYLSULFATE 1 MG/ML IJ SOLN
INTRAMUSCULAR | Status: DC | PRN
  Administered 2011-12-22: 5 mg via INTRAVENOUS

## 2011-12-22 MED ORDER — LACTATED RINGERS IV SOLN: INTRAVENOUS | Status: DC

## 2011-12-22 SURGICAL SUPPLY — 59 items
ADH SKN CLS APL DERMABOND .7 (GAUZE/BANDAGES/DRESSINGS) ×2
APL SRG 32X5 SNPLK LF DISP (MISCELLANEOUS) ×1
APPLICATOR COTTON TIP 6IN STRL (MISCELLANEOUS) IMPLANT
APPLIER CLIP ROT 10 11.4 M/L (STAPLE) ×3
APR CLP MED LRG 11.4X10 (STAPLE) ×1
BAG SPEC RTRVL LRG 6X4 10 (ENDOMECHANICALS)
CABLE HIGH FREQUENCY MONO STRZ (ELECTRODE) IMPLANT
CANISTER SUCTION 2500CC (MISCELLANEOUS) ×3 IMPLANT
CHLORAPREP W/TINT 26ML (MISCELLANEOUS) ×6 IMPLANT
CLIP APPLIE ROT 10 11.4 M/L (STAPLE) ×2 IMPLANT
CLOTH BEACON ORANGE TIMEOUT ST (SAFETY) ×3 IMPLANT
COVER SURGICAL LIGHT HANDLE (MISCELLANEOUS) IMPLANT
DERMABOND ADVANCED (GAUZE/BANDAGES/DRESSINGS) ×1
DERMABOND ADVANCED .7 DNX12 (GAUZE/BANDAGES/DRESSINGS) ×2 IMPLANT
DEVICE SUTURE ENDOST 10MM (ENDOMECHANICALS) IMPLANT
DRAIN CHANNEL 19F RND (DRAIN) ×3 IMPLANT
DRAPE LAPAROSCOPIC ABDOMINAL (DRAPES) ×3 IMPLANT
ELECT REM PT RETURN 9FT ADLT (ELECTROSURGICAL) ×3
ELECTRODE REM PT RTRN 9FT ADLT (ELECTROSURGICAL) ×2 IMPLANT
EVACUATOR DRAINAGE 10X20 100CC (DRAIN) ×2 IMPLANT
EVACUATOR SILICONE 100CC (DRAIN) ×3
GLOVE BIO SURGEON STRL SZ7.5 (GLOVE) ×6 IMPLANT
GLOVE SURG SS PI 7.5 STRL IVOR (GLOVE) ×6 IMPLANT
GOWN STRL NON-REIN LRG LVL3 (GOWN DISPOSABLE) ×9 IMPLANT
GOWN STRL REIN XL XLG (GOWN DISPOSABLE) ×9 IMPLANT
HANDLE STAPLE EGIA 4 XL (STAPLE) ×3 IMPLANT
HOVERMATT SINGLE USE (MISCELLANEOUS) ×3 IMPLANT
KIT BASIN OR (CUSTOM PROCEDURE TRAY) ×3 IMPLANT
MARKER SKIN DUAL TIP RULER LAB (MISCELLANEOUS) ×3 IMPLANT
NEEDLE SPNL 22GX3.5 QUINCKE BK (NEEDLE) ×3 IMPLANT
NS IRRIG 1000ML POUR BTL (IV SOLUTION) ×3 IMPLANT
PENCIL BUTTON HOLSTER BLD 10FT (ELECTRODE) ×3 IMPLANT
POUCH SPECIMEN RETRIEVAL 10MM (ENDOMECHANICALS) IMPLANT
RELOAD BLACK 60MM ECHELON (STAPLE) ×6 IMPLANT
RELOAD EGIA 60 MED/THCK PURPLE (STAPLE) ×12 IMPLANT
RELOAD GREEN (STAPLE) IMPLANT
RELOAD TRI 2.0 60 XTHK VAS SUL (STAPLE) IMPLANT
SCISSORS LAP 5X35 DISP (ENDOMECHANICALS) ×3 IMPLANT
SEALANT SURGICAL APPL DUAL CAN (MISCELLANEOUS) ×3 IMPLANT
SET IRRIG TUBING LAPAROSCOPIC (IRRIGATION / IRRIGATOR) ×3 IMPLANT
SHEARS CURVED HARMONIC AC 45CM (MISCELLANEOUS) ×3 IMPLANT
SLEEVE XCEL OPT CAN 5 100 (ENDOMECHANICALS) ×6 IMPLANT
SOLUTION ANTI FOG 6CC (MISCELLANEOUS) ×3 IMPLANT
SPONGE GAUZE 4X4 12PLY (GAUZE/BANDAGES/DRESSINGS) ×3 IMPLANT
SPONGE LAP 18X18 X RAY DECT (DISPOSABLE) ×3 IMPLANT
STAPLE ECHEON FLEX 60 POW ENDO (STAPLE) IMPLANT
STRIP PERI DRY VERITAS 60 (STAPLE) IMPLANT
SUT ETHILON 2 0 PS N (SUTURE) ×3 IMPLANT
SUT MNCRL AB 4-0 PS2 18 (SUTURE) ×6 IMPLANT
SUT VICRYL 0 UR6 27IN ABS (SUTURE) ×3 IMPLANT
SYR 50ML LL SCALE MARK (SYRINGE) ×3 IMPLANT
TAPE CLOTH SURG 4X10 WHT LF (GAUZE/BANDAGES/DRESSINGS) ×3 IMPLANT
TRAY FOLEY CATH 14FRSI W/METER (CATHETERS) ×3 IMPLANT
TRAY LAP CHOLE (CUSTOM PROCEDURE TRAY) ×3 IMPLANT
TROCAR BLADELESS 15MM (ENDOMECHANICALS) ×3 IMPLANT
TROCAR BLADELESS OPT 5 100 (ENDOMECHANICALS) ×3 IMPLANT
TUBING CONNECTING 10 (TUBING) ×3 IMPLANT
TUBING ENDO SMARTCAP (MISCELLANEOUS) ×3 IMPLANT
TUBING FILTER THERMOFLATOR (ELECTROSURGICAL) ×3 IMPLANT

## 2011-12-22 NOTE — Transfer of Care (Signed)
Immediate Anesthesia Transfer of Care Note  Patient: Marissa Cox  Procedure(s) Performed: Procedure(s) (LRB) with comments: LAPAROSCOPIC GASTRIC SLEEVE RESECTION (N/A) UPPER GI ENDOSCOPY ()  Patient Location: PACU  Anesthesia Type:General  Level of Consciousness: awake, sedated and patient cooperative  Airway & Oxygen Therapy: Patient Spontanous Breathing and Patient connected to face mask oxygen  Post-op Assessment: Report given to PACU RN and Post -op Vital signs reviewed and stable  Post vital signs: Reviewed and stable  Complications: No apparent anesthesia complications

## 2011-12-22 NOTE — Interval H&P Note (Signed)
History and Physical Interval Note:  12/22/2011 7:14 AM  Hubert Azure  has presented today for surgery, with the diagnosis of MORBID OBESITY  The various methods of treatment have been discussed with the patient and family. After consideration of risks, benefits and other options for treatment, the patient has consented to  Procedure(s) (LRB) with comments: LAPAROSCOPIC GASTRIC SLEEVE RESECTION (N/A) as a surgical intervention .  The patient's history has been reviewed, patient examined, no change in status, stable for surgery.  I have reviewed the patient's chart and labs.  Questions were answered to the patient's satisfaction.  Pt. Seen in preop area.  She denies any smoking.  Risks and benefits of sleeve gastrectomy again discussed in lay terms.  The risks of infection, bleeding, pain, scarring, weight regain, too little or too much weight loss, vitamin deficiencies and need for lifelong vitamin supplementation, hair loss, need for protein supplementation, leaks, stricture, reflux, food intolerance, need for reoperation and conversion to roux Y gastric bypass, need for open surgery, injury to spleen or surrounding structures, DVT's, PE, and death again discussed with the patient and the patient expressed understanding and desires to proceed with laparoscopic vertical sleeve gastrectomy, possible open, intraoperative endoscopy.    Lodema Pilot DAVID

## 2011-12-22 NOTE — H&P (View-Only) (Signed)
Patient ID: Marissa Cox, female   DOB: Mar 23, 1960, 51 y.o.   MRN: [id number]  No chief complaint on file.   HPI Marissa Cox is a 51 y.o. female.  This patient presents for her preoperative evaluation for vertical sleeve gastrectomy. She has a BMI of 45 with comorbidities of hypertension, hyperlipidemia, and bronchitis and is interested in sleeve gastrectomy. Since her initial visit she has completely stop smoking and has been smoke-free for 5 months and she feels well. She has been walking and doing her we fit it has lost about 9 pounds on the preoperative diet HPI  Past Medical History  Diagnosis Date  . Hyperlipidemia   . Hypertension     Past Surgical History  Procedure Date  . Tubal ligation 1990    Family History  Problem Relation Age of Onset  . Cancer Mother     bladder  . Cancer Maternal Aunt     breast  . Cancer Paternal Aunt     breast  . Cancer Paternal Aunt     breast  . Cancer Paternal Aunt     breast  . Cancer Maternal Aunt     ovarian    Social History History  Substance Use Topics  . Smoking status: Former Games developer  . Smokeless tobacco: Former Neurosurgeon    Quit date: 08/07/2011  . Alcohol Use: No    No Known Allergies  Current Outpatient Prescriptions  Medication Sig Dispense Refill  . cholecalciferol (VITAMIN D) 1000 UNITS tablet Take 2,000 Units by mouth daily.      Marland Kitchen ezetimibe-simvastatin (VYTORIN) 10-20 MG per tablet Take 1 tablet by mouth at bedtime.      Marland Kitchen lisinopril-hydrochlorothiazide (PRINZIDE,ZESTORETIC) 20-12.5 MG per tablet Take 1 tablet by mouth daily before breakfast.         Review of Systems Review of Systems All other review of systems negative or noncontributory except as stated in the HPI  There were no vitals taken for this visit.  Physical Exam Physical Exam Physical Exam  Nursing note and vitals reviewed. Constitutional: She is oriented to person, place, and time. She appears well-developed and well-nourished. No  distress.  HENT:  Head: Normocephalic and atraumatic.  Mouth/Throat: No oropharyngeal exudate.  Eyes: Conjunctivae and EOM are normal. Pupils are equal, round, and reactive to light. Right eye exhibits no discharge. Left eye exhibits no discharge. No scleral icterus.  Neck: Normal range of motion. Neck supple. No tracheal deviation present.  Cardiovascular: Normal rate, regular rhythm, normal heart sounds and intact distal pulses.   Pulmonary/Chest: Effort normal and breath sounds normal. No stridor. No respiratory distress. She has no wheezes.  Abdominal: Soft. Bowel sounds are normal. She exhibits no distension and no mass. There is no tenderness. There is no rebound and no guarding.  Musculoskeletal: Normal range of motion. She exhibits no edema and no tenderness.  Neurological: She is alert and oriented to person, place, and time.  Skin: Skin is warm and dry. No rash noted. She is not diaphoretic. No erythema. No pallor.  Psychiatric: She has a normal mood and affect. Her behavior is normal. Judgment and thought content normal.    Data Reviewed   Assessment    Morbid obesity with a BMI of 45 and comorbidities of hypertension and hyperlipidemia She has completed her necessary workup and evaluation for vertical sleeve gastrectomy. She seems excited for this new change in her life and is on the preoperative diet. She has stopped smoking in her nicotine test  has been negative. We again discussed the procedure and the pros and cons and risks and benefits of vertical sleeve gastrectomy.The risks of infection, bleeding, pain, scarring, weight regain, too little or too much weight loss, vitamin deficiencies and need for lifelong vitamin supplementation, hair loss, need for protein supplementation, leaks, stricture, reflux, food intolerance, need for reoperation and conversion to roux Y gastric bypass, need for open surgery, injury to spleen or surrounding structures, DVT's, PE, and death again  discussed with the patient and the patient expressed understanding and desires to proceed with laparoscopic vertical sleeve gastrectomy, possible open, intraoperative endoscopy.     Plan    We will plan for laparoscopic vertical sleeve gastrectomy possible open next week.       Lodema Pilot DAVID 12/16/2011, 1:43 PM

## 2011-12-22 NOTE — Anesthesia Postprocedure Evaluation (Signed)
  Anesthesia Post-op Note  Patient: Marissa Cox  Procedure(s) Performed: Procedure(s) (LRB): LAPAROSCOPIC GASTRIC SLEEVE RESECTION (N/A) UPPER GI ENDOSCOPY ()  Patient Location: PACU  Anesthesia Type: General  Level of Consciousness: awake and alert   Airway and Oxygen Therapy: Patient Spontanous Breathing  Post-op Pain: mild  Post-op Assessment: Post-op Vital signs reviewed, Patient's Cardiovascular Status Stable, Respiratory Function Stable, Patent Airway and No signs of Nausea or vomiting  Last Vitals:  Filed Vitals:   12/22/11 1045  BP: 148/73  Pulse: 75  Temp:   Resp: 18    Post-op Vital Signs: stable   Complications: No apparent anesthesia complications

## 2011-12-22 NOTE — Anesthesia Preprocedure Evaluation (Signed)
Anesthesia Evaluation  Patient identified by MRN, date of birth, ID band Patient awake    Reviewed: Allergy & Precautions, H&P , NPO status , Patient's Chart, lab work & pertinent test results, reviewed documented beta blocker date and time   Airway Mallampati: II TM Distance: >3 FB Neck ROM: full    Dental No notable dental hx.    Pulmonary neg pulmonary ROS,  breath sounds clear to auscultation  Pulmonary exam normal       Cardiovascular Exercise Tolerance: Good hypertension, On Medications Rhythm:regular Rate:Normal     Neuro/Psych negative neurological ROS  negative psych ROS   GI/Hepatic negative GI ROS, Neg liver ROS,   Endo/Other  Morbid obesity  Renal/GU negative Renal ROS  negative genitourinary   Musculoskeletal   Abdominal (+) + obese,   Peds  Hematology negative hematology ROS (+)   Anesthesia Other Findings   Reproductive/Obstetrics negative OB ROS                           Anesthesia Physical Anesthesia Plan  ASA: III  Anesthesia Plan: General   Post-op Pain Management:    Induction: Intravenous  Airway Management Planned: Oral ETT  Additional Equipment:   Intra-op Plan:   Post-operative Plan: Extubation in OR  Informed Consent: I have reviewed the patients History and Physical, chart, labs and discussed the procedure including the risks, benefits and alternatives for the proposed anesthesia with the patient or authorized representative who has indicated his/her understanding and acceptance.   Dental Advisory Given  Plan Discussed with: CRNA  Anesthesia Plan Comments:         Anesthesia Quick Evaluation

## 2011-12-22 NOTE — Brief Op Note (Signed)
12/22/2011  9:44 AM  PATIENT:  Marissa Cox  51 y.o. female  PRE-OPERATIVE DIAGNOSIS:  MORBID OBESITY  POST-OPERATIVE DIAGNOSIS:  Morbid Obesity  PROCEDURE:  Procedure(s) (LRB) with comments: LAPAROSCOPIC GASTRIC SLEEVE RESECTION (N/A) UPPER GI ENDOSCOPY ()  SURGEON:  Surgeon(s) and Role:    * Lodema Pilot, DO - Primary    * Valarie Merino, MD  PHYSICIAN ASSISTANT:   ASSISTANTS: martin   ANESTHESIA:   general  EBL:  Total I/O In: 2000 [I.V.:2000] Out: 225 [Urine:225]  BLOOD ADMINISTERED:none  DRAINS: (86F) Jackson-Pratt drain(s) with closed bulb suction in the along the sleeve   LOCAL MEDICATIONS USED:  MARCAINE    and LIDOCAINE   SPECIMEN:  Source of Specimen:  sleeve gastrectomy with incidentally found gastric mass  DISPOSITION OF SPECIMEN:  PATHOLOGY  COUNTS:  YES  TOURNIQUET:  * No tourniquets in log *  DICTATION: .Other Dictation: Dictation Number [id number]  PLAN OF CARE: Admit to inpatient   PATIENT DISPOSITION:  PACU - hemodynamically stable.   Delay start of Pharmacological VTE agent (>24hrs) due to surgical blood loss or risk of bleeding: no

## 2011-12-22 NOTE — Op Note (Signed)
Marissa Cox, Marissa Cox             ACCOUNT NO.:  [id number]  MEDICAL RECORD NO.:  [id number]  LOCATION:  1532                         FACILITY:  [facility]  PHYSICIAN:  Lodema Pilot, MD       DATE OF BIRTH:  12/16/1960  DATE OF PROCEDURE:  12/22/2011 DATE OF DISCHARGE:                              OPERATIVE REPORT   PROCEDURE:  Laparoscopic vertical sleeve gastrectomy with intraoperative upper endoscopy.  PREOPERATIVE DIAGNOSIS:  Morbid obesity.  POSTOP DIAGNOSIS:  Morbid obesity.  SURGEON:  Lodema Pilot, MD  ASSISTANT:  Dr. Daphine Deutscher.  ANESTHESIA:  General endotracheal anesthesia with of 35 mL of 1% lidocaine with epinephrine and 0.25% Marcaine in a 50:50 mixture.  FLUIDS:  2500 mL crystalloid.  ESTIMATED BLOOD LOSS:  50 mL.  DRAINS:  A 19-French Blake drain was placed along the gastric sleeve staple line.  SPECIMENS:  Sleeve gastrectomy with gastric mass, suture marking the mass.  COMPLICATIONS:  None apparent.  FINDINGS:  Vertical sleeve gastrectomy created with 36-French bougie. Incidentally found 1 cm gastric mass excised with the greater curvature of the stomach.  Suture marking the tumor and sent to Pathology for permanent section.  Tisseel glue placed along the stable line and 19- French drain placed along the staple lines.  INDICATION FOR PROCEDURE:  Marissa Cox is a 51 year old female with BMI of 45%, hypertension, arthritis, and failed medical weight loss attempts and in need of durable weight loss solution.  OPERATIVE DETAILS:  Marissa Cox is seen and evaluated in the preoperative area and risks and benefits of procedure were discussed again in lay terms.  Informed consent was obtained.  She was given prophylactic antibiotics and subcutaneous heparin and was taken to the operating room, and general endotracheal anesthesia obtained.  Foley catheter was placed, and her abdomen was prepped and draped in a standard surgical fashion.  The 5-mm Optiview trocar  was used to access the peritoneum in the left upper quadrant.  The pneumoperitoneum was obtained.  Then a 5-mm left rectus port was placed and a 15-mm right rectus port and a 5 mm right upper quadrant port were placed under direct visualization.  Nathanson liver retractor was passed through epigastric stab incisions to retract the left lobe of the liver.  She was noted to have a small gastric masses from the greater curvature of the stomach, which was going to be resected with the specimen.  This was likely a small leiomyoma or GIST.  I measured 5 cm from the pylorus and started dividing the short gastric vessels.  Carried this dissection up around the greater curve of the stomach and up to the spleen, completely divided the short gastric vessels up to the left crus after the stomach was completely mobilized and rolled the stomach over and there was no significant posterior gastric attachments.  We were ready to start dividing the stomach.  First firing of the stapler taken with a 60 mm black load stapler, taking care not to completely transect the stomach or narrow the stomach near the incisure and a second black stapler was placed on the stomach in the anticipated area of dissection, and medial to the gastric a 36-French bougie was passed under direct  visualization into the duodenum along the lesser curvature of the stomach, and the second firing was taken with a black 60 mm stapler and then transitioned to a purple Tri-Staple loads, marching up along the bougie up towards the angle of His, taking care to avoid spiralling of the staple line and Christmas tree formation.  As I approached the angle of His, I angled off the bougie to ensure that they were not cutting across the esophagus, and I stayed off the epigastric fat pad.  The stomach was completely transected, and the hemostasis along the staple line was obtained with a few hemoclips,  and the staples appeared well formed, again  without any Christmas formation or spiralling.  Dr. Daphine Deutscher then performed EGD while I had the stomach under water and as I insufflated this we did not see any evidence of air leakage.  There was no evidence of any stenosis or narrowing of the sleeve.  There was no evidence of internal bleeding, and the staple line did not course up onto the stomach and the antrum was normal.  Pylorus was easily visualized.  The air was suctioned and the scope was removed.  Again, there was no evidence of narrowing or air leakage.  The staple line was again inspected for hemostasis, and the Tisseel fibrin glue was applied along the staple line  and the 15-mm port site was enlarged, and the resected greater curvature of the stomach was removed.  The mass was marked with a suture and sent to Pathology for permanent section, and a 19-French Blake drain was placed in the abdomen just posterior to the staple lines and I pulled out through the left upper quadrant trocar site and sutured in place with a 2-0 nylon drain stitch.  The omentum was placed up over the sleeves.  The liver retractor was removed under direct visualization.  The fascial defect at the right rectus extraction site was approximated with figure-of-eight 0 Vicryl sutures and another single 0 Vicryl sutures in open fashion.  Sutures were secured and the abdomen was re-insufflated with carbon dioxide gas and was noted to be hemostatic without evidence of bleeding or bowel injury.  Abdominal wall closure was noted to be adequate.  Trocars removed, and the abdominal wall was noted be hemostatic.  The wounds were injected with total of 35 mL of 1% lidocaine with epinephrine 0.25% Marcaine in a 50:50 mixture. The skin edges were approximated with 4-0 Monocryl subcuticular sutures. The skin was washed and dried and Dermabond was applied.  All sponge, needle, and instrument counts were correct at the end of the case.  The patient tolerated the procedure  well without apparent complication.  The Foley catheter was removed, and she was ready for transfer to the recovery room in stable condition.          ______________________________ Lodema Pilot, MD     BL/MEDQ  D:  12/22/2011  T:  12/22/2011  Job:  [id number]

## 2011-12-23 ENCOUNTER — Encounter (HOSPITAL_COMMUNITY): Payer: Self-pay | Admitting: General Surgery

## 2011-12-23 LAB — CBC WITH DIFFERENTIAL/PLATELET
Basophils Absolute: 0 10*3/uL (ref 0.0–0.1)
Eosinophils Relative: 0 % (ref 0–5)
HCT: 32.2 % — ABNORMAL LOW (ref 36.0–46.0)
Lymphocytes Relative: 12 % (ref 12–46)
Lymphs Abs: 1.1 10*3/uL (ref 0.7–4.0)
MCV: 71.2 fL — ABNORMAL LOW (ref 78.0–100.0)
Monocytes Relative: 7 % (ref 3–12)
Platelets: 230 10*3/uL (ref 150–400)
RBC: 4.52 MIL/uL (ref 3.87–5.11)
WBC: 8.8 10*3/uL (ref 4.0–10.5)

## 2011-12-23 LAB — COMPREHENSIVE METABOLIC PANEL
Albumin: 3.3 g/dL — ABNORMAL LOW (ref 3.5–5.2)
BUN: 14 mg/dL (ref 6–23)
Creatinine, Ser: 1.04 mg/dL (ref 0.50–1.10)
GFR calc Af Amer: 70 mL/min — ABNORMAL LOW (ref >90)
Glucose, Bld: 123 mg/dL — ABNORMAL HIGH (ref 70–99)
Total Bilirubin: 0.2 mg/dL — ABNORMAL LOW (ref 0.3–1.2)
Total Protein: 6.8 g/dL (ref 6.0–8.3)

## 2011-12-23 MED ORDER — CHLORHEXIDINE GLUCONATE 0.12 % MT SOLN
15.0000 mL | Freq: Four times a day (QID) | OROMUCOSAL | Status: DC
  Administered 2011-12-23: 15 mL via OROMUCOSAL
  Filled 2011-12-23 (×5): qty 15

## 2011-12-23 MED ORDER — BIOTENE DRY MOUTH MT LIQD: 15.0000 mL | OROMUCOSAL | Status: DC | PRN

## 2011-12-23 NOTE — Progress Notes (Signed)
Pt alert and oriented; VSS; c/o some nausea but states in the past she typically has nausea post surgery but taking prn meds with relief; will trial bariatric clear liquids today and see if she tolerates; burping; denies flatus or BM; voiding without difficulty; c/o some abdominal soreness with relief from prn meds; ambulating in hallways without difficulty; using incentive spirometer as directed; discharge instructions given for pt to review; questions answered. GASTRIC BYPASS/SLEEVE DISCHARGE INSTRUCTIONS  Drs. Fredrik Rigger, Hoxworth, Wilson, and [place] Call if you have any problems.   Call [phone] and ask for the surgeon on call.    If you need immediate assistance come to the ER at [facility]. Tell the ER personnel that you are a new post-op gastric bypass patient. Signs and symptoms to report:   Severe vomiting or nausea. If you cannot tolerate clear liquids for longer than 1 day, you need to call your surgeon.    Abdominal pain which does not get better after taking your pain medication   Fever greater than 101 F degree   Difficulty breathing   Chest pain    Redness, swelling, drainage, or foul odor at incision sites    If your incisions open or pull apart   Swelling or pain in calf (lower leg)   Diarrhea, frequent watery, uncontrolled bowel movements.   Constipation, (no bowel movements for 3 days) if this occurs, Take Milk of Magnesia, 2 tablespoons by mouth, 3 times a day for 2 days if needed.  Call your doctor if constipation continues. Stop taking Milk of Magnesia once you have had a bowel movement. You may also use Miralax according to the label instructions.   Anything you consider "abnormal for you".   Normal side effects after Surgery:   Unable to sleep at night or concentrate   Irritability   Being tearful (crying) or depressed   These are common complaints, possibly related to your anesthesia, stress of surgery and change in lifestyle, that usually go away a few  weeks after surgery.  If these feelings continue, call your medical doctor.  Wound Care You may have surgical glue, steri-strips, or staples over your incisions after surgery.  Surgical glue:  Looks like a clear film over your incisions and will wear off gradually. Steri-strips: Strips of tape over your incisions. You may notice a yellowish color on the skin underneath the steri-strips. This is a substance used to make the steri-strips stick better. Do not pull the steri-strips off - let them fall off.  Staples: Cherlynn Polo may be removed before you leave the hospital. If you go home with staples, call [facility] [phone]) for an appointment with your surgeon's nurse to have staples removed in 7 - 10 days. Showering: You may shower two days after your surgery unless otherwise instructed by your surgeon. Wash gently around wounds with warm soapy water, rinse well, and gently pat dry.  If you have a drain, you may need someone to hold this while you shower. Avoid tub baths until staples are removed and incisions are healed.    Medications   Medications should be liquid or crushed if larger than the size of a dime.  Extended release pills should not be crushed.   Depending on the size and number of medications you take, you may need to stagger/change the time you take your medications so that you do not over-fill your pouch.    Make sure you follow-up with your primary care physician to make medication adjustments needed  during rapid weight loss and life-style adjustment.   If you are diabetic, follow up with the doctor that prescribes your diabetes medication(s) within one week after surgery and check your blood sugar regularly.   Do not drive while taking narcotics!   Do not take acetaminophen (Tylenol) and Roxicet or Lortab Elixir at the same time since these pain medications contain acetaminophen.  Diet at home: (First 2 Weeks) You will see the nutritionist two weeks after your  surgery. She will advance your diet if you are tolerating liquids well. Once at home, if you have severe vomiting or nausea and cannot tolerate clear liquids lasting longer than 1 day, call your surgeon.  Begin high protein shake 2 ounces every 3 hours, 5 - 6 times per day.  Gradually increase the amount you drink as tolerated.  You may find it easier to slowly sip shakes throughout the day.  It is important to get your proteins in first.   Protein Shake   Drink at least 2 ounces of shake 5-6 times per day   Each serving of protein shakes should have a minimum of 15 grams of protein and no more than 5 grams of carbohydrate    Increase the amount of protein shake you drink as tolerated   Protein powder may be added to fluids such as non-fat milk or Lactaid milk (limit to 20 grams added protein powder per serving   The initial goal is to drink at least 8 ounces of protein shake/drink per day (or as directed by the nutritionist). Some examples of protein shakes are [employer], [employer], EAS Edge HP, and Unjury. Hydration   Gradually increase the amount of water and other liquids as tolerated (See Acceptable Fluids)   Gradually increase the amount of protein shake as tolerated     Sip fluids slowly and throughout the day   May use Sugar substitutes, use sparingly (limit to 6 - 8 packets per day). Your fluid goal is 64 ounces of fluid daily. It may take a few weeks to build up to this.         32 oz (or more) should be clear liquids and 32 oz (or more) should be full liquids.         Liquids should not contain sugar, caffeine, or carbonation! Acceptable Fluids Clear Liquids:   Water or Sugar-free flavored water, Fruit H2O   Decaffeinated coffee or tea (sugar-free)   Crystal Lite, Wyler's Lite, Minute Maid Lite   Sugar-free Jell-O   Bouillon or broth   Sugar-free Popsicle:   *Less than 20 calories each; Limit 1 per day   Full Liquids:              Protein Shakes/Drinks + 2 choices  per day of other full liquids shown below.    Other full liquids must be: No more than 12 grams of Carbs per serving,  No more than 3 grams of Fat per serving   Strained low-fat cream soup   Non-Fat milk   Fat-free Lactaid Milk   Sugar-free yogurt (Dannon Lite & Fit) Vitamins and Minerals (Start 1 day after surgery unless otherwise directed)   2 Chewable Multivitamin / Multimineral Supplement (i.e. Centrum for Adults)   Chewable Calcium Citrate with Vitamin D-3. Take 1500 mg each day.           (Example: 3 Chewable Calcium Plus 600 with Vitamin D-3 can be found at [facility])         Vitamin  B-12, 350 - 500 micrograms (oral tablet) each day   Do not mix multivitamins containing iron with calcium supplements; take 2 hours   apart   Do not substitute Tums (calcium carbonate) for your calcium   Menstruating women and those at risk for anemia may need extra iron. Talk with your doctor to see if you need additional iron.    If you need extra iron:  Total daily Iron recommendations (including Vitamins) = 50 - 100 mg Iron/day Do not stop taking or change any vitamins or minerals until you talk to your nutritionist or surgeon. Your nutritionist and / or physician must approve all vitamin and mineral supplements. Exercise For maximum success, begin exercising as soon as your doctor recommends. Make sure your physician approves any physical activity.   Depending on fitness level, begin with a simple walking program   Walk 5-15 minutes each day, 7 days per week.    Slowly increase until you are walking 30-45 minutes per day   Consider joining our BELT program. [phone] or email [email or web address] Things to remember:    You may have sexual relations when you feel comfortable. It is VERY important for female patients to use a reliable birth control method. Fertility often increases after surgery. Do not get pregnant for at least 18 months.   It is very important to keep all follow up appointments with your  surgeon, nutritionist, primary care physician, and behavioral health practitioner. After the first year, please follow up with your bariatric surgeon at least once a year in order to maintain best weight loss results.  [facility]: [phone] [facility]: [phone]   Free counseling is available for you and your family through collaboration between [facility] and [place]. Please call [phone] and leave a message.    Consider purchasing a medical alert bracelet that says you had gastric bypass surgery.    [facility] has a free Bariatric Surgery Support Group that meets monthly, the 3rd Thursday, 6 pm, Classroom #1, [employer]. You may register online at [email or web address], but registration is not necessary. Select Classes and Support Groups, Bariatric Surgery, or Call [phone]   Do not return to work or drive until cleared by your surgeon   Use your CPAP when sleeping if applicable   Do not lift anything greater than ten pounds for at least two weeks  Joen Laura, RN Bariatric Nurse coordinator

## 2011-12-23 NOTE — Progress Notes (Signed)
1 Day Post-Op  Subjective: Feels "pretty good, a little sore".  She has some nausea  Objective: Vital signs in last 24 hours: Temp:  [97.4 F (36.3 C)-99.5 F (37.5 C)] 99 F (37.2 C) (11/27 0519) Pulse Rate:  [60-86] 60  (11/27 0519) Resp:  [16-23] 18  (11/27 0519) BP: (128-161)/(58-94) 149/59 mmHg (11/27 0519) SpO2:  [97 %-100 %] 98 % (11/27 0519) Weight:  [299 lb (135.626 kg)] 299 lb (135.626 kg) (11/26 1125)    Intake/Output from previous day: 11/26 0701 - 11/27 0700 In: 4902.1 [I.V.:4902.1] Out: 2025 [Urine:1925; Drains:100] Intake/Output this shift:    General appearance: alert, cooperative and no distress Resp: nonlabored Cardio: normal rate, regular GI: soft, appropriate incisional tenderness, ND, wounds okay, JP ss Extremities: SCD's bilat  Lab Results:   [facility] 12/23/11 0437  WBC 8.8  HGB 10.1*  HCT 32.2*  PLT 230   BMET  Basename 12/23/11 0437  NA 136  K 4.3  CL 103  CO2 26  GLUCOSE 123*  BUN 14  CREATININE 1.04  CALCIUM 9.3   PT/INR No results found for this basename: LABPROT:2,INR:2 in the last 72 hours ABG No results found for this basename: PHART:2,PCO2:2,PO2:2,HCO3:2 in the last 72 hours  Studies/Results: No results found.  Anti-infectives: Anti-infectives     Start     Dose/Rate Route Frequency Ordered Stop   12/22/11 0538   ertapenem (INVANZ) 1 g in sodium chloride 0.9 % 50 mL IVPB        1 g 100 mL/hr over 30 Minutes Intravenous On call to O.R. 12/22/11 0538 12/22/11 0730          Assessment/Plan: s/p Procedure(s) (LRB) with comments: LAPAROSCOPIC GASTRIC SLEEVE RESECTION (N/A) UPPER GI ENDOSCOPY () she looks appropriate.  still some nausea which is common.  she can trial some liquids if desired and advance quantity as tolerated.  mobilize  LOS: 1 day    Marissa Cox DAVID 12/23/2011

## 2011-12-23 NOTE — Progress Notes (Signed)
Patient evaluated for long-term disease management services with [facility] Care Management Program and Link to Wellness. Patient very familiar with program. Left contact information and Link to Wellness information. Patient will receive a post discharge transition of care call upon discharge.  Raiford Noble, RN,BSN, MSN [facility] Liaison [phone]

## 2011-12-23 NOTE — Progress Notes (Signed)
Dr. Biagio Quint in to see patient, aware patient slightly nauseated at this time. States for patient to drink 2oz h20 sip over one hour until lunch and if tolerates, then at lunch may do cliq sips the same way and may have as tol at dinner. No UGI to be done.

## 2011-12-23 NOTE — Care Management Note (Signed)
    Page 1 of 1   12/23/2011     10:34:00 AM   CARE MANAGEMENT NOTE 12/23/2011  Patient:  Marissa Cox, Marissa Cox   Account Number:  [id number]  Date Initiated:  12/23/2011  Documentation initiated by:  Lorenda Ishihara  Subjective/Objective Assessment:   51 yo female admitted s/p sleeve gastrectomy. PTA lived at home with spouse.     Action/Plan:   Home when stable   Anticipated DC Date:  12/25/2011   Anticipated DC Plan:  HOME/SELF CARE      DC Planning Services  CM consult      Choice offered to / List presented to:             Status of service:  Completed, signed off Medicare Important Message given?   (If response is "NO", the following Medicare IM given date fields will be blank) Date Medicare IM given:   Date Additional Medicare IM given:    Discharge Disposition:  HOME/SELF CARE  Per UR Regulation:  Reviewed for med. necessity/level of care/duration of stay  If discussed at Long Length of Stay Meetings, dates discussed:    Comments:

## 2011-12-24 NOTE — Discharge Summary (Signed)
Physician Discharge Summary  Patient ID: Marissa Cox MRN: [id number] DOB/AGE: 52-Jul-1962 52 y.o.  Admit date: 12/22/2011 Discharge date: 12/24/2011  Admission Diagnoses: obesity  Discharge Diagnoses: obesity Active Problems:  * No active hospital problems. *    Discharged Condition: stable  Hospital Course: to OR 12/22/11 for lap sleeve gastrectomy.  She had some postop nausea which resolved on postop day 1.  Diet was advanced and tolerating liquids.  Pain controlled and ambulatory.  She was stable and ready for discharge on POD 2  Consults: None  Significant Diagnostic Studies:   Treatments: surgery: 12/22/11 lap sleeve gastrectomy with EGD  Discharge Exam: Blood pressure 107/69, pulse 50, temperature 98 F (36.7 C), temperature source Oral, resp. rate 18, height 5\' 7"  (1.702 m), weight 299 lb (135.626 kg), SpO2 100.00%.   Disposition:   Discharge Orders    Future Orders Please Complete By Expires   Increase activity slowly      Discharge instructions      Comments:   Call [phone] for follow up appointment in 3 weeks.   Bariatric diet as instructed. May shower tomorrow.   Call MD for:  temperature >100.4      Call MD for:  persistant nausea and vomiting      Call MD for:  severe uncontrolled pain      Call MD for:  redness, tenderness, or signs of infection (pain, swelling, redness, odor or green/yellow discharge around incision site)      Call MD for:  difficulty breathing, headache or visual disturbances          Medication List     As of 12/24/2011  9:03 AM    TAKE these medications         cholecalciferol 1000 UNITS tablet   Commonly known as: VITAMIN D   Take 2,000 Units by mouth daily.      ezetimibe-simvastatin 10-20 MG per tablet   Commonly known as: VYTORIN   Take 1 tablet by mouth at bedtime.      lisinopril-hydrochlorothiazide 20-12.5 MG per tablet   Commonly known as: PRINZIDE,ZESTORETIC   Take 1 tablet by mouth daily before  breakfast.      ondansetron 4 MG disintegrating tablet   Commonly known as: ZOFRAN-ODT   Take 1 tablet (4 mg total) by mouth every 8 (eight) hours as needed for nausea.      oxyCODONE 5 MG/5ML solution   Commonly known as: ROXICODONE   Take 5-10 mLs (5-10 mg total) by mouth every 4 (four) hours as needed for pain.         SignedLodema Pilot DAVID 12/24/2011, 9:03 AM

## 2011-12-24 NOTE — Progress Notes (Signed)
Pt alert and oriented; sitting up in chair at bedside; son at bedside; states she feels much better today; nausea resolved; denies any vomiting; tolerating bariatric liquids well; burping; +flatus; denies BM; voiding without difficulty; ambulating in hallways without difficulty; using incentive spirometer as directed; c/o some abdominal soreness with relief from prn meds; pt already has follow up appts with [facility] and [facility]; aware of support group and BELT program; discharge instructions reviewed and pt verbalized understanding of; questions answered. GASTRIC BYPASS/SLEEVE DISCHARGE INSTRUCTIONS  Drs. Fredrik Rigger, Hoxworth, Wilson, and [place] Call if you have any problems.   Call [phone] and ask for the surgeon on call.    If you need immediate assistance come to the ER at [facility]. Tell the ER personnel that you are a new post-op gastric bypass patient. Signs and symptoms to report:   Severe vomiting or nausea. If you cannot tolerate clear liquids for longer than 1 day, you need to call your surgeon.    Abdominal pain which does not get better after taking your pain medication   Fever greater than 101 F degree   Difficulty breathing   Chest pain    Redness, swelling, drainage, or foul odor at incision sites    If your incisions open or pull apart   Swelling or pain in calf (lower leg)   Diarrhea, frequent watery, uncontrolled bowel movements.   Constipation, (no bowel movements for 3 days) if this occurs, Take Milk of Magnesia, 2 tablespoons by mouth, 3 times a day for 2 days if needed.  Call your doctor if constipation continues. Stop taking Milk of Magnesia once you have had a bowel movement. You may also use Miralax according to the label instructions.   Anything you consider "abnormal for you".   Normal side effects after Surgery:   Unable to sleep at night or concentrate   Irritability   Being tearful (crying) or depressed   These are common complaints, possibly related to your  anesthesia, stress of surgery and change in lifestyle, that usually go away a few weeks after surgery.  If these feelings continue, call your medical doctor.  Wound Care You may have surgical glue, steri-strips, or staples over your incisions after surgery.  Surgical glue:  Looks like a clear film over your incisions and will wear off gradually. Steri-strips: Strips of tape over your incisions. You may notice a yellowish color on the skin underneath the steri-strips. This is a substance used to make the steri-strips stick better. Do not pull the steri-strips off - let them fall off.  Staples: Cherlynn Polo may be removed before you leave the hospital. If you go home with staples, call [facility] [phone]) for an appointment with your surgeon's nurse to have staples removed in 7 - 10 days. Showering: You may shower two days after your surgery unless otherwise instructed by your surgeon. Wash gently around wounds with warm soapy water, rinse well, and gently pat dry.  If you have a drain, you may need someone to hold this while you shower. Avoid tub baths until staples are removed and incisions are healed.    Medications   Medications should be liquid or crushed if larger than the size of a dime.  Extended release pills should not be crushed.   Depending on the size and number of medications you take, you may need to stagger/change the time you take your medications so that you do not over-fill your pouch.    Make sure you follow-up with  your primary care physician to make medication adjustments needed during rapid weight loss and life-style adjustment.   If you are diabetic, follow up with the doctor that prescribes your diabetes medication(s) within one week after surgery and check your blood sugar regularly.   Do not drive while taking narcotics!   Do not take acetaminophen (Tylenol) and Roxicet or Lortab Elixir at the same time since these pain medications contain  acetaminophen.  Diet at home: (First 2 Weeks) You will see the nutritionist two weeks after your surgery. She will advance your diet if you are tolerating liquids well. Once at home, if you have severe vomiting or nausea and cannot tolerate clear liquids lasting longer than 1 day, call your surgeon.  Begin high protein shake 2 ounces every 3 hours, 5 - 6 times per day.  Gradually increase the amount you drink as tolerated.  You may find it easier to slowly sip shakes throughout the day.  It is important to get your proteins in first.   Protein Shake   Drink at least 2 ounces of shake 5-6 times per day   Each serving of protein shakes should have a minimum of 15 grams of protein and no more than 5 grams of carbohydrate    Increase the amount of protein shake you drink as tolerated   Protein powder may be added to fluids such as non-fat milk or Lactaid milk (limit to 20 grams added protein powder per serving   The initial goal is to drink at least 8 ounces of protein shake/drink per day (or as directed by the nutritionist). Some examples of protein shakes are [employer], [employer]'s, E[employer] Edge HP, and Unjury. Hydration   Gradually increase the amount of water and other liquids as tolerated (See Acceptable Fluids)   Gradually increase the amount of protein shake as tolerated     Sip fluids slowly and throughout the day   May use Sugar substitutes, use sparingly (limit to 6 - 8 packets per day). Your fluid goal is 64 ounces of fluid daily. It may take a few weeks to build up to this.         32 oz (or more) should be clear liquids and 32 oz (or more) should be full liquids.         Liquids should not contain sugar, caffeine, or carbonation! Acceptable Fluids Clear Liquids:   Water or Sugar-free flavored water, Fruit H2O   Decaffeinated coffee or tea (sugar-free)   Crystal Lite, Wyler's Lite, Minute Maid Lite   Sugar-free Jell-O   Bouillon or broth   Sugar-free Popsicle:   *Less than 20  calories each; Limit 1 per day   Full Liquids:              Protein Shakes/Drinks + 2 choices per day of other full liquids shown below.    Other full liquids must be: No more than 12 grams of Carbs per serving,  No more than 3 grams of Fat per serving   Strained low-fat cream soup   Non-Fat milk   Fat-free Lactaid Milk   Sugar-free yogurt (Dannon Lite & Fit) Vitamins and Minerals (Start 1 day after surgery unless otherwise directed)   2 Chewable Multivitamin / Multimineral Supplement (i.e. Centrum for Adults)   Chewable Calcium Citrate with Vitamin D-3. Take 1500 mg each day.           (Example: 3 Chewable Calcium Plus 600 with Vitamin D-3 can be found at [facility])  Vitamin B-12, 350 - 500 micrograms (oral tablet) each day   Do not mix multivitamins containing iron with calcium supplements; take 2 hours   apart   Do not substitute Tums (calcium carbonate) for your calcium   Menstruating women and those at risk for anemia may need extra iron. Talk with your doctor to see if you need additional iron.    If you need extra iron:  Total daily Iron recommendations (including Vitamins) = 50 - 100 mg Iron/day Do not stop taking or change any vitamins or minerals until you talk to your nutritionist or surgeon. Your nutritionist and / or physician must approve all vitamin and mineral supplements. Exercise For maximum success, begin exercising as soon as your doctor recommends. Make sure your physician approves any physical activity.   Depending on fitness level, begin with a simple walking program   Walk 5-15 minutes each day, 7 days per week.    Slowly increase until you are walking 30-45 minutes per day   Consider joining our BELT program. [phone] or email [email or web address] Things to remember:    You may have sexual relations when you feel comfortable. It is VERY important for female patients to use a reliable birth control method. Fertility often increases after surgery. Do not get  pregnant for at least 18 months.   It is very important to keep all follow up appointments with your surgeon, nutritionist, primary care physician, and behavioral health practitioner. After the first year, please follow up with your bariatric surgeon at least once a year in order to maintain best weight loss results.  [facility]: [phone] [facility]: [phone]   Free counseling is available for you and your family through collaboration between [facility] and [place]. Please call [phone] and leave a message.    Consider purchasing a medical alert bracelet that says you had gastric bypass surgery.    [facility] has a free Bariatric Surgery Support Group that meets monthly, the 3rd Thursday, 6 pm, Classroom #1, [employer]. You may register online at [email or web address], but registration is not necessary. Select Classes and Support Groups, Bariatric Surgery, or Call [phone]   Do not return to work or drive until cleared by your surgeon   Use your CPAP when sleeping if applicable   Do not lift anything greater than ten pounds for at least two weeks  Joen Laura, RN Bariatric Nurse Coordinator

## 2011-12-24 NOTE — Progress Notes (Signed)
2 Days Post-Op  Subjective: Feels well, no nausea, pain controlled   Objective: Vital signs in last 24 hours: Temp:  [98 F (36.7 C)-99 F (37.2 C)] 98 F (36.7 C) (11/28 0635) Pulse Rate:  [50-60] 50  (11/28 0635) Resp:  [16-18] 18  (11/28 0635) BP: (100-126)/(56-69) 107/69 mmHg (11/28 0635) SpO2:  [98 %-100 %] 100 % (11/28 0635)    Intake/Output from previous day: 11/27 0701 - 11/28 0700 In: 3492.9 [P.O.:420; I.V.:3072.9] Out: 1705 [Urine:1550; Drains:155] Intake/Output this shift:    General appearance: alert, cooperative and no distress Resp: nonlabored Cardio: brady, regular GI: soft, minimal tenderness, ND, wounds okay, she has some bruising around extraction site, but no sign of infection, no peritoneal signs, JP ss  Lab Results:   [facility] 12/23/11 0437  WBC 8.8  HGB 10.1*  HCT 32.2*  PLT 230   BMET  Basename 12/23/11 0437  NA 136  K 4.3  CL 103  CO2 26  GLUCOSE 123*  BUN 14  CREATININE 1.04  CALCIUM 9.3   PT/INR No results found for this basename: LABPROT:2,INR:2 in the last 72 hours ABG No results found for this basename: PHART:2,PCO2:2,PO2:2,HCO3:2 in the last 72 hours  Studies/Results: No results found.  Anti-infectives: Anti-infectives     Start     Dose/Rate Route Frequency Ordered Stop   12/22/11 0538   ertapenem (INVANZ) 1 g in sodium chloride 0.9 % 50 mL IVPB        1 g 100 mL/hr over 30 Minutes Intravenous On call to O.R. 12/22/11 0538 12/22/11 0730          Assessment/Plan: s/p Procedure(s) (LRB) with comments: LAPAROSCOPIC GASTRIC SLEEVE RESECTION (N/A) UPPER GI ENDOSCOPY () she looks and feels well, tolerating diet without nausea.  No evidence of any postop complications.  She should be okay for discharge today. I reviewed her postop care and diet.  LOS: 2 days    Lodema Pilot DAVID 12/24/2011

## 2011-12-28 ENCOUNTER — Telehealth (INDEPENDENT_AMBULATORY_CARE_PROVIDER_SITE_OTHER): Payer: Self-pay

## 2011-12-28 NOTE — Telephone Encounter (Signed)
Message copied by Maryan Puls on Mon Dec 28, 2011  4:59 PM ------      Message from: Cathi Roan      Created: Wed Dec 23, 2011  9:32 AM      Regarding: 1st post op sleeve      Contact: [phone]       Dr. Biagio Quint office change 01/01/12 needs new 1st postop appt.      Office to start at 10:30

## 2011-12-28 NOTE — Telephone Encounter (Signed)
Patient aware of new post op appointment for Friday 01/08/12 @ 9:45 w/Dr. Biagio Quint.

## 2012-01-01 ENCOUNTER — Ambulatory Visit (INDEPENDENT_AMBULATORY_CARE_PROVIDER_SITE_OTHER): Payer: Self-pay | Admitting: General Surgery

## 2012-01-05 ENCOUNTER — Encounter: Payer: 59 | Attending: General Surgery | Admitting: *Deleted

## 2012-01-05 DIAGNOSIS — Z01818 Encounter for other preprocedural examination: Secondary | ICD-10-CM | POA: Insufficient documentation

## 2012-01-05 DIAGNOSIS — Z713 Dietary counseling and surveillance: Secondary | ICD-10-CM | POA: Insufficient documentation

## 2012-01-06 ENCOUNTER — Encounter: Payer: Self-pay | Admitting: *Deleted

## 2012-01-06 NOTE — Progress Notes (Addendum)
Bariatric Class:  Appt start time: 1600 end time:  1700.  2 Week Post-Operative Nutrition Class  Patient was seen on 01/05/12 for Post-Operative Nutrition education at [facility].   Surgery date: 12/22/11  Surgery type: Gastric Sleeve  Start weight at [facility]: 298.5 lbs (08/26/11)  Last weight (Pre-Op Class): 312.6 lbs (12/03/11)  Weight today: 294.0 lbs Weight change: 18.6 lbs (from Pre-Op wt) Total weight lost: 18.6 lbs BMI: 46.0  TANITA  BODY COMP RESULTS  08/26/11 01/05/12   Fat Mass (lbs) 154.5 162.5   Fat Free Mass (lbs) 144.0 131.5   Total Body Water (lbs) 105.5 96.5   The following the learning objectives were met by the patient during this course:   Identifies Phase 3A (Soft, High Proteins) Dietary Goals and will begin from 2 weeks post-operatively to 2 months post-operatively  Identifies appropriate sources of fluids and proteins   States protein recommendations and appropriate sources post-operatively  Identifies the need for appropriate texture modifications, mastication, and bite sizes when consuming solids  Identifies appropriate multivitamin and calcium sources post-operatively  Describes the need for physical activity post-operatively and will follow MD recommendations  States when to call healthcare provider regarding medication questions or post-operative complications  Handouts given during class include:  Phase 3A: Soft, High Protein Diet Handout  Follow-Up Plan: Patient will follow-up at [facility] in 6 weeks for 2 months post-op nutrition visit for diet advancement per MD.

## 2012-01-06 NOTE — Patient Instructions (Signed)
Patient to follow Phase 3A-Soft, High Protein Diet and follow-up at [facility] in 6 weeks for 2 months post-op nutrition visit for diet advancement. 

## 2012-01-08 ENCOUNTER — Ambulatory Visit (INDEPENDENT_AMBULATORY_CARE_PROVIDER_SITE_OTHER): Payer: Commercial Managed Care - PPO | Admitting: General Surgery

## 2012-01-08 ENCOUNTER — Encounter (INDEPENDENT_AMBULATORY_CARE_PROVIDER_SITE_OTHER): Payer: Self-pay | Admitting: General Surgery

## 2012-01-08 ENCOUNTER — Encounter (INDEPENDENT_AMBULATORY_CARE_PROVIDER_SITE_OTHER): Payer: Self-pay

## 2012-01-08 VITALS — BP 132/76 | HR 81 | Temp 98.2°F | Resp 18 | Ht 67.0 in | Wt 292.2 lb

## 2012-01-08 DIAGNOSIS — Z4889 Encounter for other specified surgical aftercare: Secondary | ICD-10-CM

## 2012-01-08 DIAGNOSIS — Z5189 Encounter for other specified aftercare: Secondary | ICD-10-CM

## 2012-01-08 MED ORDER — URSODIOL 300 MG PO CAPS: 300.0000 mg | ORAL_CAPSULE | Freq: Two times a day (BID) | ORAL | Status: DC

## 2012-01-08 MED ORDER — PANTOPRAZOLE SODIUM 40 MG PO TBEC: 40.0000 mg | DELAYED_RELEASE_TABLET | Freq: Every day | ORAL | Status: DC

## 2012-01-08 NOTE — Progress Notes (Signed)
Subjective:     Patient ID: Marissa Cox, female   DOB: 1960/04/13, 51 y.o.   MRN: [id number]  HPI This patient follows up 2 weeks status post laparoscopic vertical sleeve gastrectomy. She has been on liquid diet and started on soft foods 3 days ago. She said that she had some nausea initially after starting solids but this has improved and is better now. She is taking her protein supplements and denies any heartburn but she does say that she has "acid feeling" in her stomach. She says that she is walking 2-3 times per week and denies any abdominal pain. She says her bowels are functioning normally and she is taking her vitamins. She was asking about the medication to prevent gallstones.  Review of Systems     Objective:   Physical Exam She is in no acute distress and nontoxic-appearing Her abdomen is soft and nontender and exam incisions are well-healed without signs of infection.    Assessment:     Status post vertical sleeve gastrectomy-doing well There is no evidence of any postoperative complications. She says that her nausea has improved and I recommended that she continue with her diet. She has not done as well as I would have hoped with her weight loss.  It sounds as though she isn't doing everything appropriately. She is taking some juice and I recommended that she stop that.  I will go ahead and start her on Protonix and Actigall N. LOC her back another 3-4 weeks to see how she is doing with her weight loss. Not sure why she is losing weight so slowly. 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 And her margins were negative.    Plan:     Increase physical activity as tolerated in continuous high-protein bariatric diet and I will see her back in 3-4 weeks

## 2012-01-14 ENCOUNTER — Ambulatory Visit (INDEPENDENT_AMBULATORY_CARE_PROVIDER_SITE_OTHER): Payer: Self-pay | Admitting: General Surgery

## 2012-02-11 ENCOUNTER — Ambulatory Visit (INDEPENDENT_AMBULATORY_CARE_PROVIDER_SITE_OTHER): Payer: Commercial Managed Care - PPO | Admitting: General Surgery

## 2012-02-11 VITALS — BP 140/82 | HR 78 | Temp 97.9°F | Resp 18 | Ht 67.0 in | Wt 281.2 lb

## 2012-02-11 DIAGNOSIS — Z5189 Encounter for other specified aftercare: Secondary | ICD-10-CM

## 2012-02-11 DIAGNOSIS — Z4889 Encounter for other specified surgical aftercare: Secondary | ICD-10-CM

## 2012-02-11 MED ORDER — PANTOPRAZOLE SODIUM 40 MG PO TBEC: 40.0000 mg | DELAYED_RELEASE_TABLET | Freq: Every day | ORAL | Status: DC

## 2012-02-11 NOTE — Progress Notes (Signed)
Subjective:     Patient ID: Marissa Cox, female   DOB: December 13, 1960, 52 y.o.   MRN: [id number]  HPI This patient follows up about 2 months status post laparoscopic vertical sleeve gastrectomy. She says that she has not been losing much weight. She says that she is eating eating 5 small meals per day and incisions making the 2 choices but is frustrated with the lack of weight loss to this point. She says she is walking about 1 mile 3 times per week . She also has some heartburn after eating. She is taking her vitamins and protein supplements  Review of Systems     Objective:   Physical Exam No distress and nontoxic-appearing Her abdomen is soft and nontender exam her incisions are well-healed without signs of infection. She has no neurologic deficits    Assessment:     Status post vertical sleeve gastrectomy I recommended that she eat only when she is hungry and focus on eating small meals and a good-quality meals. I also recommended that she keep a food journals listing everything that she takes in and she is scheduled to see a nutritionist next week and this can help her try to modify her diet. I also recommended she increase her physical activity. I'm not sure why she is not losing weight as expected hopefully the food journals and increase activities will help.    Plan:     We will increase her Protonix to twice daily and focus on the small high-quality meals I recommended that she keep her for internal and she will follow up with the nutritionist next week. I will see her back in 3-4 weeks for repeat evaluation.

## 2012-02-16 ENCOUNTER — Encounter: Payer: 59 | Attending: General Surgery | Admitting: *Deleted

## 2012-02-16 ENCOUNTER — Encounter: Payer: Self-pay | Admitting: *Deleted

## 2012-02-16 DIAGNOSIS — Z713 Dietary counseling and surveillance: Secondary | ICD-10-CM | POA: Insufficient documentation

## 2012-02-16 DIAGNOSIS — Z01818 Encounter for other preprocedural examination: Secondary | ICD-10-CM | POA: Insufficient documentation

## 2012-02-16 NOTE — Patient Instructions (Addendum)
Goals:  Follow Phase 3B: High Protein + Non-Starchy Vegetables  Eat 3-6 small meals/snacks, every 3-5 hrs  Increase lean protein foods to meet 60-80g goal  Increase fluid intake to 64oz +  Avoid drinking 15 minutes before, during and 30 minutes after eating  Aim for >30 min of physical activity daily  Coupon code for FreedaVite MVI is [id number] for $2 off an order of $10 or more  TANITA  BODY COMP RESULTS  08/26/11 01/05/12 02/16/12   Fat Mass (lbs) 154.5 162.5 148.5   Fat Free Mass (lbs) 144.0 131.5 131.5   Total Body Water (lbs) 105.5 96.5 96.5

## 2012-02-16 NOTE — Progress Notes (Signed)
  Follow-up visit:  8 Weeks Post-Operative Gastric Sleeve Surgery  Medical Nutrition Therapy:  Appt start time: 0830 end time:  0900.  Primary concerns today: Post-operative Bariatric Surgery Nutrition Management. Marissa Cox returns for 2 month post-op f/u with additional 14 lb wt loss (entirely from FAT MASS).  Doing well, though asks if she should be losing the weight faster than she is. Following all post-op nutrition protocols and continues to increase daily protein and fluid intake. No problems reported.   Surgery date: 12/22/11  Surgery type: Gastric Sleeve  Start weight at [facility]: 298.5 lbs (08/26/11)  Last weight (Pre-Op Class): 312.6 lbs (12/03/11)  Weight today: 280.0 lbs Weight change: 14.0 lbs Total weight lost: 32.6 lbs BMI: 43.9  TANITA  BODY COMP RESULTS  08/26/11 01/05/12 02/16/12    BMI (kg/m^2) 46.7 46.0 43.9    Fat Mass (lbs) 154.5 162.5 148.5    Fat Free Mass (lbs) 144.0 131.5 131.5   Total Body Water (lbs) 105.5 96.5 96.5   24-hr recall:  See patient provided food recall located under media tab.  Fluid intake: Vitawater, Propel water, decaf coffee = 40-45 oz - Discussed increasing to relieve constipation and prevent dehydration. Estimated total protein intake:  ~50g (1 day of 20g) - working on increasing  Medications: See medication list. No longer taking Zofran.  Supplementation: Taking regularly as instructed  Using straws: No Drinking while eating: No Hair loss: No Carbonated beverages: No N/V/D/C: Mild constipation - using Miralax or stool softener prn Dumping syndrome: No  Recent physical activity:  Walks 3 days/week; does exercises at home on cold days  Progress Towards Goal(s):  In progress.  Handouts given during visit include:  Phase 3B: High Protein + Non-Starchy Vegetables  Samples given during visit include:   Freedavite MVI: 2 bottles @ 5 tabs/bottle Lot # [id number]; Exp: 01/17   Nutritional Diagnosis:  [place]-3.3 Overweight/obesity related to past  poor dietary habits and physical inactivity as evidenced by patient w/ recent Gastric Sleeve surgery following dietary guidelines for continued weight loss.    Intervention:  Nutrition education/diet advancement.  Monitoring/Evaluation:  Dietary intake, exercise, lap band fills, and body weight. Follow up in 1 months for 3 month post-op visit.

## 2012-03-03 ENCOUNTER — Ambulatory Visit (INDEPENDENT_AMBULATORY_CARE_PROVIDER_SITE_OTHER): Payer: Commercial Managed Care - PPO | Admitting: General Surgery

## 2012-03-15 ENCOUNTER — Encounter: Payer: 59 | Attending: General Surgery | Admitting: *Deleted

## 2012-03-15 ENCOUNTER — Encounter: Payer: Self-pay | Admitting: *Deleted

## 2012-03-15 DIAGNOSIS — Z01818 Encounter for other preprocedural examination: Secondary | ICD-10-CM | POA: Insufficient documentation

## 2012-03-15 DIAGNOSIS — Z713 Dietary counseling and surveillance: Secondary | ICD-10-CM | POA: Insufficient documentation

## 2012-03-15 NOTE — Progress Notes (Signed)
  Follow-up visit:  12 Weeks Post-Operative Gastric Sleeve Surgery  Medical Nutrition Therapy:  Appt start time: 0830 end time:  0900.  Primary concerns today: Post-operative Bariatric Surgery Nutrition Management. Marissa Cox returns for 3 month post-op f/u with additional 9 lbs FAT MASS.  Doing well, though asks if she should be losing the weight faster than she is. Following all post-op nutrition protocols and continues to increase daily protein and fluid intake. No problems reported.   Surgery date: 12/22/11  Surgery type: Gastric Sleeve  Start weight at [facility]: 298.5 lbs (08/26/11)  Last weight (Pre-Op Class): 312.6 lbs (12/03/11)  Weight today: 273.0 lbs Weight change: 7.0 lbs Total weight lost: 39.6 lbs  Goal weight: 188 lbs (Sz 15-16) % goal met: 32%  TANITA  BODY COMP RESULTS  08/26/11 01/05/12 02/16/12 03/15/12    BMI (kg/m^2) 46.7 46.0 43.9 42.8    Fat Mass (lbs) 154.5 162.5 148.5 139.5    Fat Free Mass (lbs) 144.0 131.5 131.5 133.5   Total Body Water (lbs) 105.5 96.5 96.5 97.5   24-hr recall:  Getting protein in through meat and beans; if low, will have a shake.   Fluid intake: Vitawater, Propel water, 45 oz water, decaf coffee = 60-65 oz  Estimated total protein intake:  ~ 60g  Medications: See medication list.   Supplementation: Taking regularly as instructed  Using straws: No Drinking while eating: No Hair loss: No Carbonated beverages: No N/V/D/C: Constipation has resolved  Dumping syndrome: No  Recent physical activity:  Walks 3 days/week; does exercises at home on cold days  Progress Towards Goal(s):  In progress.  Nutritional Diagnosis:  [place]-3.3 Overweight/obesity related to past poor dietary habits and physical inactivity as evidenced by patient w/ recent Gastric Sleeve surgery following dietary guidelines for continued weight loss.    Intervention:  Nutrition education/diet advancement.  Monitoring/Evaluation:  Dietary intake, exercise, and body weight. Follow  up in 3 months for 6 month post-op visit.

## 2012-03-15 NOTE — Patient Instructions (Addendum)
Goals:  Follow Phase 3B: High Protein + Non-Starchy Vegetables  Eat 3-6 small meals/snacks, every 3-5 hrs  Increase lean protein foods to meet 60-80g goal  Increase fluid intake to 64oz +  May add 15 grams of carbohydrate (whole grains) with meals  Avoid drinking 15 minutes before, during and 30 minutes after eating  Aim for >30 min of physical activity daily

## 2012-03-21 ENCOUNTER — Other Ambulatory Visit (HOSPITAL_COMMUNITY): Payer: Self-pay | Admitting: Internal Medicine

## 2012-03-21 DIAGNOSIS — Z1231 Encounter for screening mammogram for malignant neoplasm of breast: Secondary | ICD-10-CM

## 2012-03-29 ENCOUNTER — Ambulatory Visit (HOSPITAL_COMMUNITY)
Admission: RE | Admit: 2012-03-29 | Discharge: 2012-03-29 | Disposition: A | Discharge status: Home / Self Care | Payer: 59 | Source: Ambulatory Visit | Attending: Internal Medicine | Admitting: Internal Medicine

## 2012-03-29 DIAGNOSIS — Z1231 Encounter for screening mammogram for malignant neoplasm of breast: Secondary | ICD-10-CM | POA: Insufficient documentation

## 2012-04-01 ENCOUNTER — Ambulatory Visit (INDEPENDENT_AMBULATORY_CARE_PROVIDER_SITE_OTHER): Payer: Commercial Managed Care - PPO | Admitting: General Surgery

## 2012-05-05 ENCOUNTER — Ambulatory Visit (INDEPENDENT_AMBULATORY_CARE_PROVIDER_SITE_OTHER): Payer: Commercial Managed Care - PPO | Admitting: General Surgery

## 2012-05-05 VITALS — BP 148/90 | HR 84 | Resp 18 | Ht 67.0 in | Wt 264.8 lb

## 2012-05-05 DIAGNOSIS — Z09 Encounter for follow-up examination after completed treatment for conditions other than malignant neoplasm: Secondary | ICD-10-CM

## 2012-05-05 DIAGNOSIS — K912 Postsurgical malabsorption, not elsewhere classified: Secondary | ICD-10-CM

## 2012-05-05 DIAGNOSIS — Z9884 Bariatric surgery status: Secondary | ICD-10-CM

## 2012-05-05 LAB — CBC WITH DIFFERENTIAL/PLATELET
Basophils Relative: 0 % (ref 0–1)
HCT: 36.6 % (ref 36.0–46.0)
Hemoglobin: 11.1 g/dL — ABNORMAL LOW (ref 12.0–15.0)
Lymphocytes Relative: 32 % (ref 12–46)
Lymphs Abs: 1.4 10*3/uL (ref 0.7–4.0)
MCHC: 30.3 g/dL (ref 30.0–36.0)
Monocytes Absolute: 0.3 10*3/uL (ref 0.1–1.0)
Monocytes Relative: 7 % (ref 3–12)
Neutro Abs: 2.6 10*3/uL (ref 1.7–7.7)
Neutrophils Relative %: 60 % (ref 43–77)
RBC: 4.96 MIL/uL (ref 3.87–5.11)

## 2012-05-05 LAB — COMPREHENSIVE METABOLIC PANEL
Albumin: 3.9 g/dL (ref 3.5–5.2)
Alkaline Phosphatase: 90 U/L (ref 39–117)
BUN: 15 mg/dL (ref 6–23)
CO2: 27 mEq/L (ref 19–32)
Calcium: 9.5 mg/dL (ref 8.4–10.5)
Chloride: 105 mEq/L (ref 96–112)
Glucose, Bld: 75 mg/dL (ref 70–99)
Potassium: 3.6 mEq/L (ref 3.5–5.3)
Sodium: 141 mEq/L (ref 135–145)
Total Protein: 6.6 g/dL (ref 6.0–8.3)

## 2012-05-05 LAB — LIPID PANEL
Cholesterol: 137 mg/dL (ref 0–200)
HDL: 50 mg/dL (ref >39)
LDL Cholesterol: 74 mg/dL (ref 0–99)
Triglycerides: 63 mg/dL (ref <150)

## 2012-05-05 LAB — IRON AND TIBC
%SAT: 19 % — ABNORMAL LOW (ref 20–55)
Iron: 61 ug/dL (ref 42–145)
UIBC: 264 ug/dL (ref 125–400)

## 2012-05-05 NOTE — Progress Notes (Signed)
Subjective:     Patient ID: Marissa Cox, female   DOB: [birth year], 52 y.o.   MRN: [id number]  HPI She follows up 4 months s/p sleeve gastrectomy and excision of gastric GIST.  She has lost 28lbs. She feels great and has lots of energy and is very satisfied with her decision to have surgery.  She has joined the [employer].  She has no food intolerances and says that she has more energy than ever.  Her bowels are normal and is taking protonix BID.  She has minimal reflux symptoms.  She is walking 1 mile on days that she is not at the BELT program.  She is taking her vitamins but not taking protein shakes consistently.  Review of Systems     Objective:   Physical Exam NAD, nontoxic No neurologic defecits Abdomen soft, NT, incisions okay    Assessment:     S/p sleeve gastrectomy/GIST excision I would like to see her losing more weight but she is very happy with the energy and lifestyle changes that she has made.  She feels good and has "no regrets".  I recommended that she continue with the exercise and making good food choices and we talked about some ways to improve her weight loss but she really did not seem concerned about this.       Plan:     Will check some nutrition labs and she will follow up in 3 months for recheck.  Increase activity and monitor caloric intake.

## 2012-05-09 LAB — VITAMIN B1: Vitamin B1 (Thiamine): 13 nmol/L (ref 8–30)

## 2012-05-26 ENCOUNTER — Telehealth (INDEPENDENT_AMBULATORY_CARE_PROVIDER_SITE_OTHER): Payer: Self-pay

## 2012-05-26 NOTE — Telephone Encounter (Signed)
Pt calling for refills for Actigall and Prilosec.  Her pharmacy is [facility].  Pls call.

## 2012-05-27 ENCOUNTER — Other Ambulatory Visit (INDEPENDENT_AMBULATORY_CARE_PROVIDER_SITE_OTHER): Payer: Self-pay

## 2012-05-27 DIAGNOSIS — K289 Gastrojejunal ulcer, unspecified as acute or chronic, without hemorrhage or perforation: Secondary | ICD-10-CM

## 2012-05-27 MED ORDER — PANTOPRAZOLE SODIUM 40 MG PO TBEC: 40.0000 mg | DELAYED_RELEASE_TABLET | Freq: Every day | ORAL | Status: DC

## 2012-06-14 ENCOUNTER — Encounter: Payer: 59 | Attending: General Surgery | Admitting: *Deleted

## 2012-06-14 ENCOUNTER — Encounter: Payer: Self-pay | Admitting: *Deleted

## 2012-06-14 DIAGNOSIS — E669 Obesity, unspecified: Secondary | ICD-10-CM | POA: Insufficient documentation

## 2012-06-14 DIAGNOSIS — Z713 Dietary counseling and surveillance: Secondary | ICD-10-CM | POA: Insufficient documentation

## 2012-06-14 DIAGNOSIS — Z9884 Bariatric surgery status: Secondary | ICD-10-CM | POA: Insufficient documentation

## 2012-06-14 DIAGNOSIS — Z09 Encounter for follow-up examination after completed treatment for conditions other than malignant neoplasm: Secondary | ICD-10-CM | POA: Insufficient documentation

## 2012-06-14 NOTE — Progress Notes (Signed)
  Follow-up visit: 6 Months Post-Operative Gastric Sleeve Surgery  Medical Nutrition Therapy:  Appt start time: 0830 end time:  0900.  Primary concerns today: Post-operative Bariatric Surgery Nutrition Management. Marissa Cox returns for 6 mo f/u with an 11 lb wt loss (10 lbs of FAT MASS). Now participating in the BELT program.  Is happy overall with her rate of loss. Trying to drink more water and watching carbs.  Following all post-op nutrition protocols and continues to increase daily protein and fluid intake. No problems reported.   Surgery date: 12/22/11  Surgery type: Gastric Sleeve  Start weight at [facility]: 298.5 lbs (08/26/11)  Last weight (Pre-Op Class): 312.6 lbs (12/03/11)  Weight today: 262.0 lbs Weight change: 11.0 lbs Total weight lost: 50.6 lbs  Goal weight: 188 lbs (Sz 15-16) % goal met: 41%  TANITA  BODY COMP RESULTS  08/26/11 01/05/12 02/16/12 03/15/12 06/14/12    BMI (kg/m^2) 46.7 46.0 43.9 42.8 41.0    Fat Mass (lbs) 154.5 162.5 148.5 139.5 129.5    Fat Free Mass (lbs) 144.0 131.5 131.5 133.5 132.5   Total Body Water (lbs) 105.5 96.5 96.5 97.5 97.0   24-hr recall:  Getting protein in through meat and beans, cheese;  B: 1 fried egg with cheese, 1 [place] sausage, 1 pc wheat toast OR pro shake if not hungry S: NONE or sometimes protein shake (30g) or bar L: 3 oz grilled chicken on salad S: NONE or popcorn (or sometimes 2 oz applesauce cup) D: 3 oz cheeseburger (no bun) and salad S: NONE or popcorn  Fluid intake:  Vitawater, Propel, crystal light in water, 45 oz water, decaf coffee = 60-65 oz  Estimated total protein intake:  ~ 60g  Medications: See medication list.   Supplementation: Taking regularly as instructed  Using straws: No Drinking while eating: No Hair loss: No Carbonated beverages:  ICE drink (not often) N/V/D/C:  None Dumping syndrome: No  Recent physical activity:  In the BELT program. Walks 3 days/week and works around the house.   Progress Towards  Goal(s):  In progress.  Nutritional Diagnosis:  [place]-3.3 Overweight/obesity related to past poor dietary habits and physical inactivity as evidenced by patient w/ recent Gastric Sleeve surgery following dietary guidelines for continued weight loss.    Intervention:  Nutrition education/reinforcement.  Monitoring/Evaluation:  Dietary intake, exercise, and body weight. Follow up in 3 months for 9 month post-op visit.

## 2012-06-14 NOTE — Patient Instructions (Signed)
Goals:  Follow Phase 3B: High Protein + Non-Starchy Vegetables  Eat 3-6 small meals/snacks, every 3-5 hrs  Increase lean protein foods to meet 60-80g goal  Increase fluid intake to 64oz +  May add 15 grams of carbohydrate (whole grains) with meals  Avoid drinking 15 minutes before, during and 30 minutes after eating  Aim for >30 min of physical activity daily

## 2012-06-14 NOTE — Addendum Note (Signed)
Addended by: Alain Honey S on: 06/14/2012 09:12 AM   Modules accepted: Orders

## 2012-09-14 ENCOUNTER — Encounter: Payer: 59 | Attending: General Surgery | Admitting: *Deleted

## 2012-09-14 ENCOUNTER — Encounter: Payer: Self-pay | Admitting: *Deleted

## 2012-09-14 DIAGNOSIS — Z9884 Bariatric surgery status: Secondary | ICD-10-CM | POA: Insufficient documentation

## 2012-09-14 DIAGNOSIS — Z09 Encounter for follow-up examination after completed treatment for conditions other than malignant neoplasm: Secondary | ICD-10-CM | POA: Insufficient documentation

## 2012-09-14 DIAGNOSIS — Z713 Dietary counseling and surveillance: Secondary | ICD-10-CM | POA: Insufficient documentation

## 2012-09-14 NOTE — Progress Notes (Addendum)
  Follow-up visit: 9 Months Post-Operative Gastric Sleeve Surgery  Medical Nutrition Therapy:  Appt start time: 0830   End time: 0900.  Primary concerns today: Post-operative Bariatric Surgery Nutrition Management. [name] returns for 9 mo f/u and is doing well! Weight loss is slowed d/t vacation, family reunion, etc. Finished BELT program and continues to exercise 6 days/week (even on vacation).  Dietary intake appears WNL, though splurges on fried foods and diet soda every now and then. Continues to struggle with fluid intake d/t business at work. Discussed strategies to increase.   Surgery date: 12/22/11  Surgery type: Gastric Sleeve  Start weight at [facility]: 298.5 lbs (08/26/11)  Last weight (Pre-Op Class): 312.6 lbs (12/03/11)  Weight today: 258.5 lbs Weight change: 3.5 lbs Total weight lost: 54.1 lbs  Goal weight: 188 lbs (Sz 15-16) % goal met: %  TANITA  BODY COMP RESULTS  08/26/11 01/05/12 02/16/12 03/15/12 06/14/12 09/14/12    BMI (kg/m^2) 46.7 46.0 43.9 42.8 41.0 40.5    Fat Mass (lbs) 154.5 162.5 148.5 139.5 129.5 128.0    Fat Free Mass (lbs) 144.0 131.5 131.5 133.5 132.5 130.5   Total Body Water (lbs) 105.5 96.5 96.5 97.5 97.0 95.5   24-hr recall:  Getting protein in through meat and beans, cheese;  B: 1 cup Cheerios w/ protein, skim milk  S: NONE or cheese/ 4 unsalted saltine crackers OR greek yogurt L:  2 oz steak on salad (small amt) S: NONE or fruit (sm amt) D: [employer] - (4) fried jumbo shrimp & (6) oysters S: NONE   Fluid intake:  32 oz @ work; 12 oz at home: 45 oz  Estimated total protein intake:  ~ 60g  Medications: See medication list.   Supplementation: Taking regularly as instructed  Using straws: No Drinking while eating: No Hair loss: No Carbonated beverages:  ICE or diet soda (not often) N/V/D/C:  None Dumping syndrome: No  Recent physical activity:  Gym 3 days/wk @ 60 min - cardio/weights. Walks 3 days/week @ 60 min.   Progress Towards  Goal(s):  In progress.  Nutritional Diagnosis:  [place]-3.3 Overweight/obesity related to past poor dietary habits and physical inactivity as evidenced by patient w/ recent Gastric Sleeve surgery following dietary guidelines for continued weight loss.  Samples given during visit include:   BariActiv MVI: 2 ea Lot: [id number]; Exp: 05/16  BariActiv Calcium: 2 ea Lot: [id number]; Exp: 05/16  BariActive Iron: 2 ea Lot: [id number]; Exp: 05/16    Intervention:  Nutrition education/reinforcement.  Monitoring/Evaluation:  Dietary intake, exercise, and body weight. Follow up in 3 months for 12 month post-op visit.

## 2012-09-14 NOTE — Patient Instructions (Addendum)
Goals:  Follow Phase 3B: High Protein + Non-Starchy Vegetables  Eat 3-6 small meals/snacks, every 3-5 hrs  Increase lean protein foods to meet 60-80g goal  Increase fluid intake to 64oz +  May add 15 grams of carbohydrate (whole grains, fruit) with meals

## 2012-12-01 ENCOUNTER — Other Ambulatory Visit: Payer: Self-pay

## 2012-12-16 ENCOUNTER — Ambulatory Visit: Payer: 59 | Admitting: *Deleted

## 2013-01-04 ENCOUNTER — Encounter: Payer: 59 | Attending: General Surgery | Admitting: Dietician

## 2013-01-04 DIAGNOSIS — Z713 Dietary counseling and surveillance: Secondary | ICD-10-CM | POA: Insufficient documentation

## 2013-01-04 NOTE — Progress Notes (Signed)
  Follow-up visit: 12 Months Post-Operative Gastric Sleeve Surgery  Medical Nutrition Therapy:  Appt start time: 0430   End time: 0500.  Primary concerns today: Post-operative Bariatric Surgery Nutrition Management. Marissa Cox returns for  12 mo f/u and and gained 7.5 lbs weight gain since her last visit. Able to tolerate all foods, eating carbs regularly. Recently was sick and had trouble exercising or drinking regularly. Has trouble "stress eating" during the holidays. Plans to start working out again more regularly.   Surgery date: 12/22/11  Surgery type: Gastric Sleeve  Start weight at [facility]: 298.5 lbs (08/26/11)  Last weight (Pre-Op Class): 312.6 lbs (12/03/11)  Weight today: 266.0 lbs Weight change: 7.5 lbs gain Total weight lost: 46.6 lbs  Goal weight: 188 lbs (Sz 15-16) % goal met: %  TANITA  BODY COMP RESULTS  08/26/11 01/05/12 02/16/12 03/15/12 06/14/12 09/14/12 01/04/13    BMI (kg/m^2) 46.7 46.0 43.9 42.8 41.0 40.5 41.7    Fat Mass (lbs) 154.5 162.5 148.5 139.5 129.5 128.0 135.0    Fat Free Mass (lbs) 144.0 131.5 131.5 133.5 132.5 130.5 131.0   Total Body Water (lbs) 105.5 96.5 96.5 97.5 97.0 95.5 96.0   24-hr recall:  Getting protein in through meat and beans, cheese;  B: egg, [place] sausage, and biscuit or protein drink and coffee S: NONE  L:  2 slices [place] sandwich with provolone and pickle  S: NONE or greek yogurt D: 2 baked chicken wings with spinach and spoonful of rice S: popcorn or fruit gummies   Fluid intake:  64 oz mostly water, sometimes protein shake, coffee, sometimes juice  Estimated total protein intake:  ~ 60g  Medications: See medication list.   Supplementation: Taking regularly as instructed  Using straws: No Drinking while eating: No Hair loss: No Carbonated beverages:  Mini ginger ale every once in awhile N/V/D/C:  Constipation sometimes Dumping syndrome: No  Recent physical activity:  No structured exercise in last month  Progress Towards Goal(s):   In progress.  Nutritional Diagnosis:  [place]-3.3 Overweight/obesity related to past poor dietary habits and physical inactivity as evidenced by patient w/ recent Gastric Sleeve surgery following dietary guidelines for continued weight loss.    Intervention:  Nutrition education/reinforcement.  Monitoring/Evaluation:  Dietary intake, exercise, and body weight. Follow up in 3 months for 15 month post-op visit.

## 2013-01-04 NOTE — Patient Instructions (Addendum)
Goals:  Follow Phase 3B: High Protein + Non-Starchy Vegetables  Eat 3-6 small meals/snacks, every 3-5 hrs  Increase lean protein foods to meet 60-80g goal  Increase fluid intake to 64oz +  Limit 15 grams of carbohydrate (whole grains, fruit) with meals  Work on getting back to exercise

## 2013-02-07 ENCOUNTER — Other Ambulatory Visit (HOSPITAL_COMMUNITY): Payer: Self-pay | Admitting: Internal Medicine

## 2013-02-07 DIAGNOSIS — Z1231 Encounter for screening mammogram for malignant neoplasm of breast: Secondary | ICD-10-CM

## 2013-03-22 ENCOUNTER — Encounter (HOSPITAL_COMMUNITY): Payer: Self-pay | Admitting: Emergency Medicine

## 2013-03-22 ENCOUNTER — Emergency Department (HOSPITAL_COMMUNITY)
Admission: EM | Admit: 2013-03-22 | Discharge: 2013-03-22 | Disposition: A | Discharge status: Home / Self Care | Payer: 59 | Source: Home / Self Care | Attending: Family Medicine | Admitting: Family Medicine

## 2013-03-22 ENCOUNTER — Emergency Department (INDEPENDENT_AMBULATORY_CARE_PROVIDER_SITE_OTHER): Payer: 59

## 2013-03-22 DIAGNOSIS — B9789 Other viral agents as the cause of diseases classified elsewhere: Secondary | ICD-10-CM

## 2013-03-22 DIAGNOSIS — B349 Viral infection, unspecified: Secondary | ICD-10-CM

## 2013-03-22 LAB — POCT URINALYSIS DIP (DEVICE)
Bilirubin Urine: NEGATIVE
Glucose, UA: NEGATIVE mg/dL
Hgb urine dipstick: NEGATIVE
Ketones, ur: NEGATIVE mg/dL
Leukocytes, UA: NEGATIVE
Nitrite: NEGATIVE
Protein, ur: 30 mg/dL — AB
Specific Gravity, Urine: >=1.03 (ref 1.005–1.030)
Urobilinogen, UA: 1 mg/dL (ref 0.0–1.0)
pH: 6 (ref 5.0–8.0)

## 2013-03-22 LAB — POCT I-STAT, CHEM 8
BUN: 14 mg/dL (ref 6–23)
Calcium, Ion: 1.1 mmol/L — ABNORMAL LOW (ref 1.12–1.23)
Chloride: 100 mEq/L (ref 96–112)
Creatinine, Ser: 1 mg/dL (ref 0.50–1.10)
Glucose, Bld: 96 mg/dL (ref 70–99)
HCT: 42 % (ref 36.0–46.0)
Hemoglobin: 14.3 g/dL (ref 12.0–15.0)
Potassium: 3.7 mEq/L (ref 3.7–5.3)
Sodium: 141 mEq/L (ref 137–147)
TCO2: 31 mmol/L (ref 0–100)

## 2013-03-22 LAB — POCT RAPID STREP A: Streptococcus, Group A Screen (Direct): NEGATIVE

## 2013-03-22 MED ORDER — ONDANSETRON HCL 4 MG PO TABS: 4.0000 mg | ORAL_TABLET | Freq: Three times a day (TID) | ORAL | Status: DC | PRN

## 2013-03-22 MED ORDER — ONDANSETRON 4 MG PO TBDP
8.0000 mg | ORAL_TABLET | Freq: Once | ORAL | Status: AC
  Administered 2013-03-22: 8 mg via ORAL

## 2013-03-22 MED ORDER — BENZONATATE 100 MG PO CAPS: 100.0000 mg | ORAL_CAPSULE | Freq: Three times a day (TID) | ORAL | Status: DC | PRN

## 2013-03-22 MED ORDER — ONDANSETRON 4 MG PO TBDP
ORAL_TABLET | ORAL | Status: AC
  Filled 2013-03-22: qty 2

## 2013-03-22 NOTE — ED Provider Notes (Signed)
CSN: [id number]     Arrival date & time 03/22/13  1208 History   First MD Initiated Contact with Patient 03/22/13 1332     Chief Complaint  Patient presents with  . URI     (Consider location/radiation/quality/duration/timing/severity/associated sxs/prior Treatment) HPI Comments: Patient reports that 4 days ago she has 2-3 days of nausea, vomiting, diarrhea that has since resolved. Since that time, she has developed subjective fever, cough, sore throat with some associated nausea and malaise. Denies GU sx. Denies rash. Denies polyuria or polydipsia. 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   The history is provided by the patient.    Past Medical History  Diagnosis Date  . Hyperlipidemia   . Hypertension   . Morbid obesity    Past Surgical History  Procedure Laterality Date  . Tubal ligation  1990  . Multiple tooth extractions  1981  . Laparoscopic gastric sleeve resection  12/22/2011    Procedure: LAPAROSCOPIC GASTRIC SLEEVE RESECTION;  Surgeon: Madilyn Hook, DO;  Location: [facility];  Service: General;  Laterality: N/A;  . Upper gi endoscopy  12/22/2011    Procedure: UPPER GI ENDOSCOPY;  Surgeon: Madilyn Hook, DO;  Location: [facility];  Service: General;;   Family History  Problem Relation Age of Onset  . Cancer Mother     bladder  . Cancer Maternal Aunt     breast  . Cancer Paternal Aunt     breast  . Cancer Paternal Aunt     breast  . Cancer Paternal Aunt     breast  . Cancer Maternal Aunt     ovarian   History  Substance Use Topics  . Smoking status: Former Research scientist (life sciences)  . Smokeless tobacco: Former Systems developer    Quit date: 08/07/2011  . Alcohol Use: No   OB History   Grav Para Term Preterm Abortions TAB SAB Ect Mult Living                 Review of Systems  Constitutional: Positive for fever, chills, appetite change and fatigue.  HENT: Positive for sore throat. Negative for congestion and rhinorrhea.    Eyes: Negative.   Respiratory: Positive for cough. Negative for chest tightness and shortness of breath.   Cardiovascular: Negative.   Gastrointestinal: Positive for nausea, vomiting and diarrhea. Negative for abdominal pain.  Endocrine: Negative.   Genitourinary: Negative.   Skin: Negative.   Neurological: Negative.       Allergies  Review of patient's allergies indicates no known allergies.  Home Medications   Current Outpatient Rx  Name  Route  Sig  Dispense  Refill  . lisinopril-hydrochlorothiazide (PRINZIDE,ZESTORETIC) 20-12.5 MG per tablet   Oral   Take 1 tablet by mouth daily before breakfast.          . benzonatate (TESSALON) 100 MG capsule   Oral   Take 1 capsule (100 mg total) by mouth 3 (three) times daily as needed for cough.   21 capsule   0   . ezetimibe-simvastatin (VYTORIN) 10-20 MG per tablet   Oral   Take 1 tablet by mouth at bedtime.         . Multiple Vitamins-Minerals (MULTIVITAMIN WITH MINERALS) tablet   Oral   Take 1 tablet by mouth 2 (two) times daily.          . ondansetron (ZOFRAN) 4 MG tablet   Oral   Take 1 tablet (4 mg total)  by mouth every 8 (eight) hours as needed for nausea or vomiting.   15 tablet   0   . pantoprazole (PROTONIX) 40 MG tablet   Oral   Take 1 tablet (40 mg total) by mouth daily.   30 tablet   3    BP 128/74  Pulse 77  Temp(Src) 98.4 F (36.9 C) (Oral)  Resp 16  SpO2 97% Physical Exam  Nursing note and vitals reviewed. Constitutional: She is oriented to person, place, and time. She appears well-developed and well-nourished. No distress.  HENT:  Head: Normocephalic and atraumatic.  Right Ear: Hearing, tympanic membrane, external ear and ear canal normal.  Left Ear: Hearing, tympanic membrane, external ear and ear canal normal.  Nose: Nose normal.  Mouth/Throat: Uvula is midline, oropharynx is clear and moist and mucous membranes are normal.  Eyes: Conjunctivae are normal. Right eye exhibits no  discharge. Left eye exhibits no discharge. No scleral icterus.  Neck: Normal range of motion. Neck supple.  Cardiovascular: Normal rate, regular rhythm and normal heart sounds.   Pulmonary/Chest: Effort normal and breath sounds normal. No respiratory distress. She has no wheezes.  Abdominal: Soft. Bowel sounds are normal. She exhibits no distension. There is no tenderness.  Musculoskeletal: Normal range of motion.  Lymphadenopathy:    She has no cervical adenopathy.  Neurological: She is alert and oriented to person, place, and time.  Skin: Skin is warm and dry. No rash noted.  Psychiatric: She has a normal mood and affect. Her behavior is normal.    ED Course  Procedures (including critical care time) Labs Review Labs Reviewed  POCT URINALYSIS DIP (DEVICE) - Abnormal; Notable for the following:    Protein, ur 30 (*)    All other components within normal limits  POCT I-STAT, CHEM 8 - Abnormal; Notable for the following:    Calcium, Ion 1.10 (*)    All other components within normal limits  CULTURE, GROUP A STREP  POCT RAPID STREP A ([facility] CARE ONLY)   Imaging Review Dg Chest 2 View  03/22/2013   CLINICAL DATA:  Cough and low-grade fever.  EXAM: CHEST  2 VIEW  COMPARISON:  12/17/2011  FINDINGS: The lungs are clear without focal infiltrate, edema, pneumothorax or pleural effusion. The cardiopericardial silhouette is within normal limits for size. Imaged bony structures of the thorax are intact.  IMPRESSION: No acute cardiopulmonary findings.   Electronically Signed   By: Misty Stanley M.D.   On: 03/22/2013 14:22      MDM   Final diagnoses:  Viral syndrome  rapid step test was negative.  blood chemistry was normal. urine studies were negative for infection, it only reveals dehydration, which is not surprising given nausea.  chest xray was without evidence of pneumonia or bronchitis. Advised use medication as prescribed for nausea and cough. Increase intake of water at home to  improve hydration (8-10, 8 oz glasses of water a day). Tylenol as directed on packaging for aches and fever. If symptoms do not begin to improve over the next few days, please follow up with Dr. Karlton Lemon.   [place], [place] 03/22/13 [phone]

## 2013-03-22 NOTE — ED Notes (Signed)
C/o 1 week duration of cough, body aches, fever, vomiting

## 2013-03-22 NOTE — Discharge Instructions (Signed)
Your rapid step test was negative. Your blood chemistry was normal. Your urine studies were negative for infection, it only reveals that you are dehydrated, which is not surprising given your nausea. Your chest xray was without evidence of pneumonia or bronchitis. Please use medication as prescribed for nausea and cough. Increase your intake of water at home to improve your hydration (8-10, 8 oz glasses of water a day). Tylenol as directed on packaging for aches and fever. If symptoms do not begin to improve over the next few days, please follow up with Dr. Karlton Lemon.

## 2013-03-24 LAB — CULTURE, GROUP A STREP

## 2013-03-24 NOTE — ED Provider Notes (Signed)
Medical screening examination/treatment/procedure(s) were performed by a resident physician or non-physician practitioner and as the supervising physician I was immediately available for consultation/collaboration.  Lynne Leader, MD    Gregor Hams, MD 03/24/13 [phone]

## 2013-03-30 ENCOUNTER — Ambulatory Visit (HOSPITAL_COMMUNITY): Payer: 59

## 2013-04-04 ENCOUNTER — Ambulatory Visit: Payer: 59 | Admitting: Dietician

## 2013-04-20 ENCOUNTER — Other Ambulatory Visit (HOSPITAL_COMMUNITY): Payer: Self-pay | Admitting: Internal Medicine

## 2013-04-20 ENCOUNTER — Ambulatory Visit (HOSPITAL_COMMUNITY)
Admission: RE | Admit: 2013-04-20 | Discharge: 2013-04-20 | Disposition: A | Discharge status: Home / Self Care | Payer: 59 | Source: Ambulatory Visit | Attending: Internal Medicine | Admitting: Internal Medicine

## 2013-04-20 DIAGNOSIS — Z1231 Encounter for screening mammogram for malignant neoplasm of breast: Secondary | ICD-10-CM | POA: Insufficient documentation

## 2013-05-01 ENCOUNTER — Ambulatory Visit: Payer: 59 | Admitting: Dietician

## 2013-05-22 ENCOUNTER — Encounter: Payer: 59 | Attending: Surgery | Admitting: Dietician

## 2013-05-22 DIAGNOSIS — Z713 Dietary counseling and surveillance: Secondary | ICD-10-CM | POA: Insufficient documentation

## 2013-05-22 DIAGNOSIS — Z9884 Bariatric surgery status: Secondary | ICD-10-CM | POA: Insufficient documentation

## 2013-05-22 DIAGNOSIS — E669 Obesity, unspecified: Secondary | ICD-10-CM | POA: Insufficient documentation

## 2013-05-22 DIAGNOSIS — Z6841 Body Mass Index (BMI) 40.0 and over, adult: Secondary | ICD-10-CM | POA: Insufficient documentation

## 2013-05-22 DIAGNOSIS — Z9889 Other specified postprocedural states: Secondary | ICD-10-CM | POA: Insufficient documentation

## 2013-05-22 NOTE — Progress Notes (Signed)
  Follow-up visit: 17 Months Post-Operative Gastric Sleeve Surgery  Medical Nutrition Therapy:  Appt start time: 0530   End time: 0600.  Primary concerns today: Post-operative Bariatric Surgery Nutrition Management. Marissa Cox returns for  17 mo f/u with no gain or loss. All Tanita information is the same  As last time. Having a lot of stress lately and having trouble getting liquids in. Feel like she needs to get back on track. Hasn't been exercising. Feels like she can't eat as much as she was able to before (at 6 months).   Has not  been exercising since her husband had a burn recently and they have been traveling to [facility] for treatment for the past month. Husband is also on dialysis. Has niece and her kids at her house now.    Surgery date: 12/22/11  Surgery type: Gastric Sleeve  [facility] weight at [facility]: 298.5 lbs (08/26/11)  Last weight (Pre-Op Class): 312.6 lbs (12/03/11)  Weight today: 266.0 lbs Weight change: no gain Total weight lost: 46.6 lbs  Goal weight: 188 lbs (Sz 15-16) % goal met: %  TANITA  BODY COMP RESULTS  08/26/11 01/05/12 02/16/12 03/15/12 06/14/12 09/14/12 01/04/13 05/22/13    BMI (kg/m^2) 46.7 46.0 43.9 42.8 41.0 40.5 41.7 41.7    Fat Mass (lbs) 154.5 162.5 148.5 139.5 129.5 128.0 135.0 135.0    Fat Free Mass (lbs) 144.0 131.5 131.5 133.5 132.5 130.5 131.0 131.0   Total Body Water (lbs) 105.5 96.5 96.5 97.5 97.0 95.5 96.0 96.0   24-hr recall:  Getting protein in through meat and beans, cheese;  B: egg, [place] sausage, and biscuit or protein drink and coffee or pancake and [place] patty  S: NONE  L:  2 fried chicken tenders  S: NONE  D: 2 baked chicken wings with spinach and spoonful of rice or hotdog S: popcorn or fruit gummies   Fluid intake:  25 oz mostly water, 1 decaf coffee, sometimes protein shake, coffee, sometimes juice  Estimated total protein intake:  ~ 60g  Medications: See medication list.   Supplementation: Taking regularly as instructed  Using straws:  No Drinking while eating: No Hair loss: No Carbonated beverages:  Mini ginger ale every once in awhile N/V/D/C:  Constipation sometimes Dumping syndrome: No  Recent physical activity:  No structured exercise   Progress Towards Goal(s):  In progress.  Nutritional Diagnosis:  [place]-3.3 Overweight/obesity related to past poor dietary habits and physical inactivity as evidenced by patient w/ recent Gastric Sleeve surgery following dietary guidelines for continued weight loss.    Intervention:  Nutrition education/reinforcement.  Monitoring/Evaluation:  Dietary intake, exercise, and body weight. Follow up in 3 months for 20 month post-op visit.

## 2013-05-22 NOTE — Patient Instructions (Addendum)
Goals:  Follow Phase 3B: High Protein + Non-Starchy Vegetables  Eat 3-6 small meals/snacks, every 3-5 hrs  Increase lean protein foods to meet 60-80g goal  Increase fluid intake to 64oz +, try to sip all day long  Limit 15 grams of carbohydrate (whole grains, fruit) with meals  Work on getting back to exercise - even take the kids to the track, step outside at lunch or work out   Have a 1-2 protein shakes each day if you are not as hungry.   Have the protein foods first and then vegetable (grilled chicken, chicken salad, tuna, eggs on salad)  For vitamins, look for Flintstones Complete, Citrical Petite (calcium caltrate), any sublingual B12 300-500 mcg

## 2013-07-04 ENCOUNTER — Emergency Department (HOSPITAL_COMMUNITY)
Admission: EM | Admit: 2013-07-04 | Discharge: 2013-07-04 | Disposition: A | Discharge status: Home / Self Care | Payer: 59 | Source: Home / Self Care | Attending: Family Medicine | Admitting: Family Medicine

## 2013-07-04 ENCOUNTER — Encounter (HOSPITAL_COMMUNITY): Payer: Self-pay | Admitting: Emergency Medicine

## 2013-07-04 DIAGNOSIS — J40 Bronchitis, not specified as acute or chronic: Secondary | ICD-10-CM

## 2013-07-04 MED ORDER — ALBUTEROL SULFATE HFA 108 (90 BASE) MCG/ACT IN AERS: 2.0000 | INHALATION_SPRAY | Freq: Four times a day (QID) | RESPIRATORY_TRACT | Status: DC | PRN

## 2013-07-04 MED ORDER — IPRATROPIUM BROMIDE 0.02 % IN SOLN
RESPIRATORY_TRACT | Status: AC
  Filled 2013-07-04: qty 2.5

## 2013-07-04 MED ORDER — PREDNISONE 50 MG PO TABS: 50.0000 mg | ORAL_TABLET | Freq: Every day | ORAL | Status: DC

## 2013-07-04 MED ORDER — ALBUTEROL SULFATE (2.5 MG/3ML) 0.083% IN NEBU
INHALATION_SOLUTION | RESPIRATORY_TRACT | Status: AC
  Filled 2013-07-04: qty 3

## 2013-07-04 MED ORDER — FLUTICASONE PROPIONATE 50 MCG/ACT NA SUSP: 2.0000 | Freq: Every day | NASAL | Status: DC

## 2013-07-04 MED ORDER — IPRATROPIUM-ALBUTEROL 0.5-2.5 (3) MG/3ML IN SOLN
3.0000 mL | Freq: Once | RESPIRATORY_TRACT | Status: AC
  Administered 2013-07-04: 3 mL via RESPIRATORY_TRACT

## 2013-07-04 NOTE — ED Provider Notes (Signed)
Marissa Cox is a 53 y.o. female who presents to Urgent Care today for tightness productive cough sore throat. Symptoms present for one day. Symptoms consistent with prior episodes of bronchitis. No medications tried. No chest pains palpitations or significant shortness of breath. She feels well otherwise. No exertional pain.  Past Medical History  Diagnosis Date  . Hyperlipidemia   . Hypertension   . Morbid obesity    History  Substance Use Topics  . Smoking status: Former Research scientist (life sciences)  . Smokeless tobacco: Former Systems developer    Quit date: 08/07/2011  . Alcohol Use: No   ROS as above Medications: No current facility-administered medications for this encounter.   Current Outpatient Prescriptions  Medication Sig Dispense Refill  . lisinopril-hydrochlorothiazide (PRINZIDE,ZESTORETIC) 20-12.5 MG per tablet Take 1 tablet by mouth daily before breakfast.       . albuterol (PROVENTIL HFA;VENTOLIN HFA) 108 (90 BASE) MCG/ACT inhaler Inhale 2 puffs into the lungs every 6 (six) hours as needed for wheezing or shortness of breath.  1 Inhaler  2  . ezetimibe-simvastatin (VYTORIN) 10-20 MG per tablet Take 1 tablet by mouth at bedtime.      . fluticasone (FLONASE) 50 MCG/ACT nasal spray Place 2 sprays into both nostrils daily.  16 g  2  . Multiple Vitamins-Minerals (MULTIVITAMIN WITH MINERALS) tablet Take 1 tablet by mouth 2 (two) times daily.       . ondansetron (ZOFRAN) 4 MG tablet Take 1 tablet (4 mg total) by mouth every 8 (eight) hours as needed for nausea or vomiting.  15 tablet  0  . pantoprazole (PROTONIX) 40 MG tablet Take 1 tablet (40 mg total) by mouth daily.  30 tablet  3  . predniSONE (DELTASONE) 50 MG tablet Take 1 tablet (50 mg total) by mouth daily.  5 tablet  0    Exam:  BP 127/57  Pulse 74  Temp(Src) 98.3 F (36.8 C) (Oral)  Resp 18  SpO2 98% Gen: Well NAD morbidly obese HEENT: EOMI,  MMM shear pharynx with cobblestoning. Tympanic membranes are normal appearing bilaterally. Lungs:  Normal work of breathing. CTABL Heart: RRR no MRG Abd: NABS, Soft. NT, ND Exts: Brisk capillary refill, warm and well perfused.   Patient was given a DuoNeb and felt much better.  No results found for this or any previous visit (from the past 24 hour(s)). No results found.  Assessment and Plan: 53 y.o. female with bronchitis. Plan to treat with prednisone albuterol and Atrovent nasal spray.  Discussed warning signs or symptoms. Please see discharge instructions. Patient expresses understanding.    Gregor Hams, MD 07/04/13 [phone]

## 2013-07-04 NOTE — ED Notes (Signed)
Cough since yesterday. History of bronchitis

## 2013-07-04 NOTE — Discharge Instructions (Signed)
Thank you for coming in today. °Call or go to the emergency room if you get worse, have trouble breathing, have chest pains, or palpitations.  ° °Bronchitis °Bronchitis is inflammation of the airways that extend from the windpipe into the lungs (bronchi). The inflammation often causes mucus to develop, which leads to a cough. If the inflammation becomes severe, it may cause shortness of breath. °CAUSES  °Bronchitis may be caused by:  °· Viral infections.   °· Bacteria.   °· Cigarette smoke.   °· Allergens, pollutants, and other irritants.   °SIGNS AND SYMPTOMS  °The most common symptom of bronchitis is a frequent cough that produces mucus. Other symptoms include: °· Fever.   °· Body aches.   °· Chest congestion.   °· Chills.   °· Shortness of breath.   °· Sore throat.   °DIAGNOSIS  °Bronchitis is usually diagnosed through a medical history and physical exam. Tests, such as chest X-rays, are sometimes done to rule out other conditions.  °TREATMENT  °You may need to avoid contact with whatever caused the problem (smoking, for example). Medicines are sometimes needed. These may include: °· Antibiotics. These may be prescribed if the condition is caused by bacteria. °· Cough suppressants. These may be prescribed for relief of cough symptoms.   °· Inhaled medicines. These may be prescribed to help open your airways and make it easier for you to breathe.   °· Steroid medicines. These may be prescribed for those with recurrent (chronic) bronchitis. °HOME CARE INSTRUCTIONS °· Get plenty of rest.   °· Drink enough fluids to keep your urine clear or pale yellow (unless you have a medical condition that requires fluid restriction). Increasing fluids may help thin your secretions and will prevent dehydration.   °· Only take over-the-counter or prescription medicines as directed by your health care provider. °· Only take antibiotics as directed. Make sure you finish them even if you start to feel better. °· Avoid secondhand  smoke, irritating chemicals, and strong fumes. These will make bronchitis worse. If you are a smoker, quit smoking. Consider using nicotine gum or skin patches to help control withdrawal symptoms. Quitting smoking will help your lungs heal faster.   °· Put a cool-mist humidifier in your bedroom at night to moisten the air. This may help loosen mucus. Change the water in the humidifier daily. You can also run the hot water in your shower and sit in the bathroom with the door closed for 5 10 minutes.   °· Follow up with your health care provider as directed.   °· Wash your hands frequently to avoid catching bronchitis again or spreading an infection to others.   °SEEK MEDICAL CARE IF: °Your symptoms do not improve after 1 week of treatment.  °SEEK IMMEDIATE MEDICAL CARE IF: °· Your fever increases. °· You have chills.   °· You have chest pain.   °· You have worsening shortness of breath.   °· You have bloody sputum. °· You faint.   °· You have lightheadedness. °· You have a severe headache.   °· You vomit repeatedly. °MAKE SURE YOU:  °· Understand these instructions. °· Will watch your condition. °· Will get help right away if you are not doing well or get worse. °Document Released: 01/12/2005 Document Revised: 11/02/2012 Document Reviewed: 09/06/2012 °ExitCare® Patient Information ©2014 [employer]. ° °

## 2013-07-06 ENCOUNTER — Other Ambulatory Visit: Payer: Self-pay | Admitting: Nurse Practitioner

## 2013-07-06 ENCOUNTER — Ambulatory Visit
Admission: RE | Admit: 2013-07-06 | Discharge: 2013-07-06 | Disposition: A | Discharge status: Home / Self Care | Payer: 59 | Source: Ambulatory Visit | Attending: Nurse Practitioner | Admitting: Nurse Practitioner

## 2013-07-06 DIAGNOSIS — R059 Cough, unspecified: Secondary | ICD-10-CM

## 2013-07-06 DIAGNOSIS — R05 Cough: Secondary | ICD-10-CM

## 2013-08-21 ENCOUNTER — Ambulatory Visit: Payer: 59 | Admitting: Dietician

## 2013-08-28 ENCOUNTER — Encounter: Payer: 59 | Attending: Surgery | Admitting: Dietician

## 2013-08-28 VITALS — Ht 67.0 in | Wt 274.5 lb

## 2013-08-28 DIAGNOSIS — Z9884 Bariatric surgery status: Secondary | ICD-10-CM | POA: Insufficient documentation

## 2013-08-28 DIAGNOSIS — Z713 Dietary counseling and surveillance: Secondary | ICD-10-CM | POA: Diagnosis not present

## 2013-08-28 DIAGNOSIS — Z6841 Body Mass Index (BMI) 40.0 and over, adult: Secondary | ICD-10-CM | POA: Insufficient documentation

## 2013-08-28 DIAGNOSIS — E669 Obesity, unspecified: Secondary | ICD-10-CM

## 2013-08-28 NOTE — Progress Notes (Signed)
  Follow-up visit: 17 Months Post-Operative Gastric Sleeve Surgery  Medical Nutrition Therapy:  Appt start time: 3335   End time: 4562.  Primary concerns today: Post-operative Bariatric Surgery Nutrition Management. Marissa Cox returns for  20 mo f/u with an 8 lb weight gain. Still have niece and her 3 kids in the house which is causing her to fight with her husband. Having to buy groceries for family. Her husband's injury to his foot is a lot better.   Not consistently exercising and has not been motivated. Still having trouble getting fluids in. Has not been eating a lot since she has been stressed.   Surgery date: 12/22/11  Surgery type: Gastric Sleeve  Start weight at [facility]: 298.5 lbs (08/26/11)  Last weight (Pre-Op Class): 312.6 lbs (12/03/11)  Weight today: 274.0 lbs  Weight change: 8 lb gain  Total weight lost: 38.6 lbs  Goal weight: 188 lbs (Sz 15-16) % goal met: %  TANITA  BODY COMP RESULTS  08/26/11 01/05/12 02/16/12 03/15/12 06/14/12 09/14/12 01/04/13 05/22/13 08/28/13    BMI (kg/m^2) 46.7 46.0 43.9 42.8 41.0 40.5 41.7 41.7 43.0    Fat Mass (lbs) 154.5 162.5 148.5 139.5 129.5 128.0 135.0 135.0 135.5    Fat Free Mass (lbs) 144.0 131.5 131.5 133.5 132.5 130.5 131.0 131.0 139.0   Total Body Water (lbs) 105.5 96.5 96.5 97.5 97.0 95.5 96.0 96.0 101.0   24-hr recall:  Getting protein in through meat and beans, cheese;  B: egg, [place] sausage, and biscuit or protein drink and coffee or pancake and [place] patty or just a cup of coffee S: NONE  L:  [place] steak with gravy, broccoli, and mac and cheese (2 bites of each) S: NONE  D: 2 baked chicken wings with cabbage and creamed potatoes S: popcorn, pretzels, or fruit gummies   Fluid intake:  25 oz mostly water, 1 decaf coffee, sometimes protein shake, coffee, sometimes juice  Estimated total protein intake:  < 60g  Medications: See medication list.   Supplementation:  Having trouble getting in calcium (taking 2 instead of 4 Citracal  sometimes)  Using straws: No Drinking while eating: No Hair loss: No Carbonated beverages:  Occasionally   N/V/D/C:  No Dumping syndrome: No  Recent physical activity:  No structured exercise   Progress Towards Goal(s):  In progress.  Nutritional Diagnosis:  [place]-3.3 Overweight/obesity related to past poor dietary habits and physical inactivity as evidenced by patient w/ recent Gastric Sleeve surgery following dietary guidelines for continued weight loss.    Intervention:  Nutrition education/reinforcement.  Monitoring/Evaluation:  Dietary intake, exercise, and body weight. Follow up in 6 weeks for 21.5 month post-op visit.

## 2013-08-28 NOTE — Patient Instructions (Signed)
Goals:  Follow Phase 3B: High Protein + Non-Starchy Vegetables  Eat 3-6 small meals/snacks, every 3-5 hrs  Increase lean protein foods to meet 60-80g goal  Increase fluid intake to 64oz +, try to sip all day long  Limit 15 grams of carbohydrate (whole grains, fruit) with meals  Work on getting back to exercise - even take the kids to the track, step outside at lunch or work on going back to the gym  Have a 1-2 protein shakes each day if you are not as hungry.   Have the protein foods first and then vegetable (grilled chicken, chicken salad, tuna, eggs on salad)  Follow Pre-Op Diet for next 2-4 weeks, then switch to Phase 3B High Protein + Non Starchy Vegetables

## 2013-10-09 ENCOUNTER — Encounter: Payer: 59 | Attending: Surgery | Admitting: Dietician

## 2013-10-09 VITALS — Ht 67.0 in | Wt 278.5 lb

## 2013-10-09 DIAGNOSIS — Z9884 Bariatric surgery status: Secondary | ICD-10-CM | POA: Insufficient documentation

## 2013-10-09 DIAGNOSIS — Z713 Dietary counseling and surveillance: Secondary | ICD-10-CM | POA: Diagnosis not present

## 2013-10-09 DIAGNOSIS — E669 Obesity, unspecified: Secondary | ICD-10-CM

## 2013-10-09 DIAGNOSIS — Z6841 Body Mass Index (BMI) 40.0 and over, adult: Secondary | ICD-10-CM | POA: Insufficient documentation

## 2013-10-09 NOTE — Patient Instructions (Addendum)
Goals:  Follow Phase 3B: High Protein + Non-Starchy Vegetables  Eat 3-6 small meals/snacks, every 3-5 hrs  Increase lean protein foods to meet 60-80g goal  Increase fluid intake to 64oz +, try to sip all day long  Limit 15 grams of carbohydrate (whole grains, fruit) with meals  Work on getting back to exercise - Plan to exercise in morning before work   Have the protein foods first and then vegetable (grilled chicken, chicken salad, tuna, eggs on salad)  Follow Pre-Op Diet for next 2-4 weeks, then switch to Phase 3B High Protein + Non Starchy Vegetables  Look for Citrical Petites calcium citrate

## 2013-10-09 NOTE — Progress Notes (Signed)
  Follow-up visit: 17 Months Post-Operative Gastric Sleeve Surgery  Medical Nutrition Therapy:  Appt start time: 2409   End time: 7353.  Primary concerns today: Post-operative Bariatric Surgery Nutrition Management. Marissa Cox returns for  22 mo f/u with an 4.5 lb weight gain. Having less stress. Niece is moving out at the end of the month.   Has had trouble having motivation exercising. Feels like she is eating right and cutting back on portion. Did eat "crazy" for her husband's birthday recently. Trying to not eat bread and potatoes. Struggling to get in water.   Surgery date: 12/22/11  Surgery type: Gastric Sleeve  Start weight at [facility]: 298.5 lbs (08/26/11)  Last weight (Pre-Op Class): 312.6 lbs (12/03/11)  Weight today: 278.5 lbs  Weight change: 4.5 lb gain, 7 lbs fat gain  Total weight lost: 34.1 lbs  Goal weight: 188 lbs (Sz 15-16) % goal met: %  TANITA  BODY COMP RESULTS  08/26/11 01/05/12 02/16/12 03/15/12 06/14/12 09/14/12 01/04/13 05/22/13 08/28/13 10/09/13    BMI (kg/m^2) 46.7 46.0 43.9 42.8 41.0 40.5 41.7 41.7 43.0 43.6    Fat Mass (lbs) 154.5 162.5 148.5 139.5 129.5 128.0 135.0 135.0 135.5 142.5    Fat Free Mass (lbs) 144.0 131.5 131.5 133.5 132.5 130.5 131.0 131.0 139.0 136.0   Total Body Water (lbs) 105.5 96.5 96.5 97.5 97.0 95.5 96.0 96.0 101.0 99.5   24-hr recall:  Getting protein in through meat and beans, cheese;  B: egg, [place] sausage or protein drink and coffee or pancake and [place] patty or just a cup of coffee S: NONE  L:  4 oz of [place] with spinach S: NONE  D: meat, vegetable and no starch S: popcorn, pretzels, or fruit gummies   Fluid intake:  25 oz mostly water, 1 decaf coffee, sometimes protein shake, coffee, sometimes juice (not often) Estimated total protein intake: ~ 60g  Medications: See medication list.   Supplementation:  Taking, though not taking iron everyday d/t calcium, struggles with calcium  Using straws: No Drinking while eating: No Hair loss:  No Carbonated beverages:  Occasionally will have Pepsi  N/V/D/C:  Nausea when chewing calcium, gets constipation if takes iron everyda Dumping syndrome: No  Recent physical activity:  Walks at work but not at home (not regularly)  Progress Towards Goal(s):  In progress.  Nutritional Diagnosis:  [place]-3.3 Overweight/obesity related to past poor dietary habits and physical inactivity as evidenced by patient w/ recent Gastric Sleeve surgery following dietary guidelines for continued weight loss.    Intervention:  Nutrition education/reinforcement.  Handouts given out during appointment: Pre-Op Diet Phase 3B High Protein + Non Starchy Vegetables  Support Group Schedules  Monitoring/Evaluation:  Dietary intake, exercise, and body weight. Follow up in 6 weeks for 24 month post-op visit.

## 2013-11-23 ENCOUNTER — Ambulatory Visit: Payer: 59 | Admitting: Dietician

## 2014-01-01 ENCOUNTER — Encounter: Payer: 59 | Attending: Surgery | Admitting: Dietician

## 2014-01-01 VITALS — Ht 67.0 in | Wt 273.5 lb

## 2014-01-01 DIAGNOSIS — Z9884 Bariatric surgery status: Secondary | ICD-10-CM | POA: Insufficient documentation

## 2014-01-01 DIAGNOSIS — E669 Obesity, unspecified: Secondary | ICD-10-CM | POA: Diagnosis present

## 2014-01-01 DIAGNOSIS — Z6841 Body Mass Index (BMI) 40.0 and over, adult: Secondary | ICD-10-CM | POA: Diagnosis not present

## 2014-01-01 DIAGNOSIS — Z713 Dietary counseling and surveillance: Secondary | ICD-10-CM | POA: Diagnosis not present

## 2014-01-01 NOTE — Patient Instructions (Addendum)
Goals:  Follow Phase 3B: High Protein + Non-Starchy Vegetables  Eat 3-6 small meals/snacks, every 3-5 hrs  Increase lean protein foods to meet 60-80g goal  Increase fluid intake to 64oz +, try to sip all day long  Limit 15 grams of carbohydrate (whole grains, fruit) with meals  Work on getting back to exercise - try to walk with husband, try a [employer] Class   Have the protein foods first and then vegetable (grilled chicken, chicken salad, tuna, eggs on salad)  Keep talking to your friends to help with grief this time of year

## 2014-01-01 NOTE — Progress Notes (Signed)
  Follow-up visit: 2 Years Post-Operative Gastric Sleeve Surgery  Medical Nutrition Therapy:  Appt start time: 0350   End time: 0938.  Primary concerns today: Post-operative Bariatric Surgery Nutrition Management. Marissa Cox returns for  24 mo f/u with an 10.5 fat mass loss. Feels like she is moving in the right direction. Feels like she is not getting in enough water. Overall more aware of what she is eating. Trying to incorporate healthier foods.   Having less stress. Niece is moving out soon (in the spring). Feels depressed since she lost her son this time of year.   Surgery date: 12/22/11  Surgery type: Gastric Sleeve  Start weight at [facility]: 298.5 lbs (08/26/11)  Last weight (Pre-Op Class): 312.6 lbs (12/03/11)  Weight today: 273.5 lbs   Weight change: 5 lb loss, 10.5 lbs fat mass loss Total weight lost: 39.1 lbs  Goal weight: 188 lbs (Sz 15-16) % goal met: %  TANITA  BODY COMP RESULTS  08/26/11 01/05/12 02/16/12 03/15/12 06/14/12 09/14/12 01/04/13 05/22/13 08/28/13 10/09/13 01/01/14    BMI (kg/m^2) 46.7 46.0 43.9 42.8 41.0 40.5 41.7 41.7 43.0 43.6 42.8    Fat Mass (lbs) 154.5 162.5 148.5 139.5 129.5 128.0 135.0 135.0 135.5 142.5 132.0    Fat Free Mass (lbs) 144.0 131.5 131.5 133.5 132.5 130.5 131.0 131.0 139.0 136.0 141.5   Total Body Water (lbs) 105.5 96.5 96.5 97.5 97.0 95.5 96.0 96.0 101.0 99.5 103.5   24-hr recall:  Getting protein in through meat and beans, cheese;  B: egg, [place] sausage or protein drink and coffee or pancake and [place] patty or just a cup of coffee S: NONE  L:  4 oz of [place] with spinach S: NONE  D: meat, vegetable and no starch S: popcorn, pretzels, or fruit gummies   Fluid intake:  25 oz mostly water, 1 decaf coffee, sometimes protein shake (not often), 6 oz regular Coke (not often) Estimated total protein intake: ~ 60g  Medications: See medication list.   Supplementation:  Taking  Using straws: No Drinking while eating: No Hair loss: No Carbonated  beverages:  Occasionally will have Coke N/V/D/C:  Nausea when overeats, gets constipation if takes iron everyday (every other day is ok) Dumping syndrome: No  Recent physical activity:  Walks at work but not at home (not regularly)  Progress Towards Goal(s):  In progress.  Nutritional Diagnosis:  [place]-3.3 Overweight/obesity related to past poor dietary habits and physical inactivity as evidenced by patient w/ recent Gastric Sleeve surgery following dietary guidelines for continued weight loss.    Intervention:  Nutrition education/reinforcement.   Monitoring/Evaluation:  Dietary intake, exercise, and body weight. Follow up in 2 months for 26 month post-op visit.

## 2014-02-14 ENCOUNTER — Ambulatory Visit (INDEPENDENT_AMBULATORY_CARE_PROVIDER_SITE_OTHER): Payer: 59 | Admitting: Podiatry

## 2014-02-14 ENCOUNTER — Encounter: Payer: Self-pay | Admitting: Podiatry

## 2014-02-14 VITALS — BP 111/67 | HR 76 | Resp 18

## 2014-02-14 DIAGNOSIS — M79673 Pain in unspecified foot: Secondary | ICD-10-CM

## 2014-02-14 DIAGNOSIS — L84 Corns and callosities: Secondary | ICD-10-CM

## 2014-02-14 NOTE — Progress Notes (Signed)
   Subjective:    Patient ID: Unk Pinto, female    DOB: 1960/10/17, 54 y.o.   MRN: [id number]  HPI  54 year old female presents the office today for complaints of painful calluses to the bottom of both of her feet. She states that she stands on her feet for work multiple hours a day and she has pain particularly over the callused areas. She denies any redness or drainage from around the sites and no other signs of infection. She denies any recent injury or trauma. No other complaints at this time.     Review of Systems  All other systems reviewed and are negative.      Objective:   Physical Exam AAO x3, NAD DP/PT pulses palpable bilaterally, CRT less than 3 seconds Protective sensation intact with Simms Weinstein monofilament, vibratory sensation intact, Achilles tendon reflex intact Thick hyperkeratotic lesions to bilateral lateral fifth digit, right submetatarsal 1, bilateral medial hallux, bilateral lateral aspects of the heel. Upon debridement of these lesions there is no underlying ulceration is no clinical signs of infection. No open lesions are identified. There is mild tenderness directly upon palpation of these lesions. There is no tenderness around the nails at this time. No interdigital maceration is identified. No other areas of tenderness to bilateral lower extremities without any overlying edema, erythema, increase in warmth bilaterally. MMT 5/5, ROM WNL Pain with calf compression, swelling, warmth, erythema.      Assessment & Plan:  54 year old female with symptomatic hyperkeratotic lesions 7. -Treatment options were discussed the patient including alternatives, risks, complications. -Lesions were sharply debrided 7 without complication/bleeding. -Discussed shoe gear modifications and possible orthotics to help offload the symptomatic areas. -Follow-up in 3 months or sooner should any problems arise. In the meantime, encouraged call the office with any  questions, concerns, change in symptoms.

## 2014-03-05 ENCOUNTER — Encounter: Payer: 59 | Attending: Surgery | Admitting: Dietician

## 2014-03-05 DIAGNOSIS — E669 Obesity, unspecified: Secondary | ICD-10-CM | POA: Insufficient documentation

## 2014-03-05 DIAGNOSIS — Z713 Dietary counseling and surveillance: Secondary | ICD-10-CM | POA: Insufficient documentation

## 2014-03-05 DIAGNOSIS — Z6841 Body Mass Index (BMI) 40.0 and over, adult: Secondary | ICD-10-CM | POA: Insufficient documentation

## 2014-03-05 DIAGNOSIS — Z9884 Bariatric surgery status: Secondary | ICD-10-CM | POA: Insufficient documentation

## 2014-03-05 NOTE — Patient Instructions (Addendum)
Goals:  Follow Phase 3B: High Protein + Non-Starchy Vegetables  Eat 3-6 small meals/snacks, every 3-5 hrs  Increase lean protein foods to meet 60-80g goal  Increase fluid intake to 64oz +, try to sip all day long  Limit 15 grams of carbohydrate (whole grains, fruit) with meals  Work on getting back to exercise - try a [facility] Class, look into a [facility] trainer (call [place]), look into the BELT program again  Have the protein foods first and then vegetable (grilled chicken, chicken salad, tuna, eggs on salad)

## 2014-03-05 NOTE — Progress Notes (Signed)
  Follow-up visit: 2 Years Post-Operative Gastric Sleeve Surgery  Medical Nutrition Therapy:  Appt start time: 0400   End time: 0420.  Primary concerns today: Post-operative Bariatric Surgery Nutrition Management. Verlie returns for  24 mo f/u with a 3 lbs weight loss. Has not been motivated to make a lot of changes, but has been watching what she is eating. Feeling good. Overall struggling with motivation. Has been working on portion sizes.   Niece is leaving in a few weeks. Overall feeling good.  Surgery date: 12/22/11  Surgery type: Gastric Sleeve  Start weight at [facility]: 298.5 lbs (08/26/11)  Last weight (Pre-Op Class): 312.6 lbs (12/03/11)  Weight today: 270.3 lbs  Weight change: 3 lb loss Total weight lost: 42 lbs  Goal weight: 188 lbs (Sz 15-16) % goal met: %  TANITA  BODY COMP RESULTS  08/26/11 01/05/12 02/16/12 03/15/12 06/14/12 09/14/12 01/04/13 05/22/13 08/28/13 10/09/13 01/01/14    BMI (kg/m^2) 46.7 46.0 43.9 42.8 41.0 40.5 41.7 41.7 43.0 43.6 42.8    Fat Mass (lbs) 154.5 162.5 148.5 139.5 129.5 128.0 135.0 135.0 135.5 142.5 132.0    Fat Free Mass (lbs) 144.0 131.5 131.5 133.5 132.5 130.5 131.0 131.0 139.0 136.0 141.5   Total Body Water (lbs) 105.5 96.5 96.5 97.5 97.0 95.5 96.0 96.0 101.0 99.5 103.5   24-hr recall:  Getting protein in through meat and beans, cheese;  B: egg, [place] sausage or protein drink and coffee or pancake and [place] patty or just a cup of coffee S: NONE  L:  4 oz of [place] with spinach S: NONE  D: meat, vegetable and no starch S: popcorn, pretzels, or fruit gummies   Fluid intake:  32-64 oz mostly water, 1 cup decaf coffee, sometimes protein shake (not often), 6 oz regular Coke (not often) Estimated total protein intake: ~ 60g  Medications: See medication list.   Supplementation:  Taking  Using straws: No Drinking while eating: No Hair loss: No Carbonated beverages:  Occasionally will have Coke N/V/D/C:  Gets constipation if takes iron everyday (every  other day is ok) Dumping syndrome: No  Recent physical activity:  Walks at work but not at home (not regularly)  Progress Towards Goal(s):  In progress.  Nutritional Diagnosis:  [place]-3.3 Overweight/obesity related to past poor dietary habits and physical inactivity as evidenced by patient w/ recent Gastric Sleeve surgery following dietary guidelines for continued weight loss.    Intervention:  Nutrition education/reinforcement. Goals:  Follow Phase 3B: High Protein + Non-Starchy Vegetables  Eat 3-6 small meals/snacks, every 3-5 hrs  Increase lean protein foods to meet 60-80g goal  Increase fluid intake to 64oz +, try to sip all day long  Limit 15 grams of carbohydrate (whole grains, fruit) with meals  Work on getting back to exercise - try a [facility] Class, look into a [facility] trainer (call [place]), look into the BEL[facility] program again  Have the protein foods first and then vegetable (grilled chicken, chicken salad, tuna, eggs on salad)   Monitoring/Evaluation:  Dietary intake, exercise, and body weight. Follow up in 3 months for 29 month post-op visit.

## 2014-06-04 ENCOUNTER — Ambulatory Visit: Payer: 59 | Admitting: Dietician

## 2014-06-29 ENCOUNTER — Other Ambulatory Visit: Payer: Self-pay | Admitting: *Deleted

## 2014-06-29 NOTE — Patient Outreach (Signed)
Left message on [facility]'s mobile number that [place] RN Care Management coordinator will accompany this RNCM on the home visit on Tuesday June 7th to see Amrita's husband Reggie so that Pam can follow Reggie for ongoing care management needs in his home since he has Medicare primary medical insurance. Barrington Ellison RN,CCM,CDE [place] Management Coordinator [facility] Office Phone [phone] Office Fax [phone]

## 2015-02-15 DIAGNOSIS — J00 Acute nasopharyngitis [common cold]: Secondary | ICD-10-CM | POA: Diagnosis not present

## 2015-02-15 DIAGNOSIS — F17210 Nicotine dependence, cigarettes, uncomplicated: Secondary | ICD-10-CM | POA: Diagnosis not present

## 2015-02-15 DIAGNOSIS — H60501 Unspecified acute noninfective otitis externa, right ear: Secondary | ICD-10-CM | POA: Diagnosis not present

## 2015-02-15 MED FILL — NICOTINE 14 MG/24HR PATCH: 14 | 28 days supply | Qty: 28 | Fill #0

## 2015-02-15 MED FILL — FLUTICASONE PROP 50 MCG SPR: 50 | 30 days supply | Qty: 16 | Fill #0

## 2015-02-15 MED FILL — CIPRODEX OTIC SUSPENSION: 0.3-0.1 | 7 days supply | Qty: 8 | Fill #0

## 2015-03-06 MED FILL — HYDROCHLOROTHIAZIDE 25 MG T: 25 | 90 days supply | Qty: 90 | Fill #1

## 2015-03-06 MED FILL — LISINOPRIL 10 MG TABLET: 10 | 90 days supply | Qty: 90 | Fill #1

## 2015-03-19 DIAGNOSIS — M1711 Unilateral primary osteoarthritis, right knee: Secondary | ICD-10-CM | POA: Diagnosis not present

## 2015-03-21 ENCOUNTER — Other Ambulatory Visit: Payer: Self-pay

## 2015-03-21 DIAGNOSIS — Z1231 Encounter for screening mammogram for malignant neoplasm of breast: Secondary | ICD-10-CM

## 2015-04-02 ENCOUNTER — Ambulatory Visit
Admission: RE | Admit: 2015-04-02 | Discharge: 2015-04-02 | Disposition: A | Discharge status: Home / Self Care | Payer: 59 | Source: Ambulatory Visit

## 2015-04-02 DIAGNOSIS — Z1231 Encounter for screening mammogram for malignant neoplasm of breast: Secondary | ICD-10-CM

## 2015-04-03 DIAGNOSIS — H5203 Hypermetropia, bilateral: Secondary | ICD-10-CM | POA: Diagnosis not present

## 2015-04-24 ENCOUNTER — Encounter: Payer: Self-pay | Admitting: Dietician

## 2015-04-24 ENCOUNTER — Encounter: Payer: 59 | Attending: General Surgery | Admitting: Dietician

## 2015-04-24 DIAGNOSIS — Z713 Dietary counseling and surveillance: Secondary | ICD-10-CM | POA: Insufficient documentation

## 2015-04-24 NOTE — Progress Notes (Signed)
  Follow-up visit: 3 Years Post-Operative Gastric Sleeve Surgery  Medical Nutrition Therapy:  Appt start time: 0350   End time: 35  Primary concerns today: Post-operative Bariatric Surgery Nutrition Management. Marissa Cox returns for  24 mo f/u with a 3 lbs weight loss. Feels out of sync with what she should be doing. Was taking iron but it makes her really constipated. Avoiding all carbs now (except yams, popcorn, fruit, or tortillas) for a church fast.   Has not been motivated to make a lot of changes, but has been watching what she is eating. Feeling good. Overall struggling with motivation. Has been working on portion sizes.   Niece is leaving in a few weeks. Overall feeling good.  Surgery date: 12/22/11  Surgery type: Gastric Sleeve  Start weight at [facility]: 298.5 lbs (08/26/11)  Last weight (Pre-Op Class): 312.6 lbs (12/03/11)  Weight today: 267 lbs   Weight change: 3 lb loss Total weight lost: 45 lbs  Goal weight: 250 lbs  24-hr recall:  Getting protein in through meat and beans, cheese;  B: egg, [place] sausage or protein drink and coffee or pancake and [place] patty or just a cup of coffee S: activia yogurt or banana L:  4 oz of turkey/baked chicken with spinach/broccoli S: popcorn D: meat, vegetable and no starch S: popcorn  Fluid intake:  36 oz mostly water, 1 cup decaf coffee, sometimes protein shake (not often) Estimated total protein intake: ~ 60g  Medications: See medication list.   Supplementation:  Taking except calcium   Using straws: No Drinking while eating: No Hair loss: No Carbonated beverages:  No N/V/D/C:  Nauseas if she doesn't eat often enough Gets constipation if takes iron  Dumping syndrome: No  Recent physical activity:  8000+ steps per day  Progress Towards Goal(s):  In progress.  Nutritional Diagnosis:  [place]-3.3 Overweight/obesity related to past poor dietary habits and physical inactivity as evidenced by patient w/ recent Gastric Sleeve surgery  following dietary guidelines for continued weight loss.    Intervention:  Nutrition education/reinforcement. Goals:  Follow Phase 3B: High Protein + Non-Starchy Vegetables  Eat 3-6 small meals/snacks, every 3-5 hrs  Increase lean protein foods to meet 60-80g goal  Increase fluid intake to 64oz +, try to sip all day long  Work on getting back to exercise -  look into a [facility] trainer  Try having Metamucil or Benefiber each day so you can take iron  Look for a combo calcium + magnesium (Nature Made)  Try caffeinated coffee to help with constipation   Monitoring/Evaluation:  Dietary intake, exercise, and body weight. Follow up in 3 months for 3+ year post-op visit.

## 2015-04-24 NOTE — Patient Instructions (Addendum)
Goals:  Follow Phase 3B: High Protein + Non-Starchy Vegetables  Eat 3-6 small meals/snacks, every 3-5 hrs  Increase lean protein foods to meet 60-80g goal  Increase fluid intake to 64oz +, try to sip all day long  Work on getting back to exercise -  look into a [facility] trainer  Try having Metamucil or Benefiber each day so you can take iron  Look for a combo calcium + magnesium (Nature Made)  Try caffeinated coffee to help with constipation

## 2015-04-29 DIAGNOSIS — M1711 Unilateral primary osteoarthritis, right knee: Secondary | ICD-10-CM | POA: Diagnosis not present

## 2015-06-10 MED FILL — HYDROCHLOROTHIAZIDE 25 MG T: 25 | 90 days supply | Qty: 90 | Fill #2

## 2015-06-10 MED FILL — LISINOPRIL 10 MG TABLET: 10 | 90 days supply | Qty: 90 | Fill #2

## 2015-07-25 ENCOUNTER — Ambulatory Visit: Payer: 59 | Admitting: Dietician

## 2015-07-30 ENCOUNTER — Ambulatory Visit (HOSPITAL_COMMUNITY)
Admission: EM | Admit: 2015-07-30 | Discharge: 2015-07-30 | Disposition: A | Discharge status: Home / Self Care | Payer: 59 | Attending: Emergency Medicine | Admitting: Emergency Medicine

## 2015-07-30 ENCOUNTER — Encounter (HOSPITAL_COMMUNITY): Payer: Self-pay | Admitting: Emergency Medicine

## 2015-07-30 DIAGNOSIS — H8110 Benign paroxysmal vertigo, unspecified ear: Secondary | ICD-10-CM | POA: Diagnosis not present

## 2015-07-30 MED ORDER — ONDANSETRON HCL 4 MG PO TABS: 4.0000 mg | ORAL_TABLET | Freq: Three times a day (TID) | ORAL | Status: DC | PRN

## 2015-07-30 MED ORDER — ONDANSETRON 4 MG PO TBDP
4.0000 mg | ORAL_TABLET | Freq: Once | ORAL | Status: AC
  Administered 2015-07-30: 4 mg via ORAL

## 2015-07-30 MED ORDER — ONDANSETRON 4 MG PO TBDP
ORAL_TABLET | ORAL | Status: AC
  Filled 2015-07-30: qty 1

## 2015-07-30 MED ORDER — MECLIZINE HCL 25 MG PO TABS: 25.0000 mg | ORAL_TABLET | Freq: Three times a day (TID) | ORAL | Status: DC | PRN

## 2015-07-30 NOTE — ED Provider Notes (Signed)
CSN: [id number]     Arrival date & time 07/30/15  1023 History   First MD Initiated Contact with Patient 07/30/15 1038     Chief Complaint  Patient presents with  . Nausea  . Dizziness   (Consider location/radiation/quality/duration/timing/severity/associated sxs/prior Treatment) HPI She is a 55 year old woman here for evaluation of dizziness. She states she drove back from [place] yesterday which involved going over the mountains in [place] as well as through tunnels. When she got home, she developed dizziness, which she describes as the bed spinning. This is associated with nausea and vomiting. She also states like she is going to fall to the right when she walks. Symptoms have improved somewhat today, but she continues to have vertigo associated with nausea. She has had vertigo in the past that was treated with meclizine and Zofran. She denies any headache, chest pain, shortness of breath. No hearing loss or tinnitus.  Past Medical History  Diagnosis Date  . Hyperlipidemia   . Hypertension   . Morbid obesity [facility])    Past Surgical History  Procedure Laterality Date  . Tubal ligation  1990  . Multiple tooth extractions  1981  . Laparoscopic gastric sleeve resection  12/22/2011    Procedure: LAPAROSCOPIC GASTRIC SLEEVE RESECTION;  Surgeon: Madilyn Hook, DO;  Location: [facility];  Service: General;  Laterality: N/A;  . Upper gi endoscopy  12/22/2011    Procedure: UPPER GI ENDOSCOPY;  Surgeon: Madilyn Hook, DO;  Location: [facility];  Service: General;;   Family History  Problem Relation Age of Onset  . Cancer Mother     bladder  . Cancer Maternal Aunt     breast  . Cancer Paternal Aunt     breast  . Cancer Paternal Aunt     breast  . Cancer Paternal Aunt     breast  . Cancer Maternal Aunt     ovarian   Social History  Substance Use Topics  . Smoking status: Former Research scientist (life sciences)  . Smokeless tobacco: Former Systems developer    Quit date: 08/07/2011  . Alcohol Use: No   OB History    No data available     Review of Systems As in history of present illness Allergies  Review of patient's allergies indicates no known allergies.  Home Medications   Prior to Admission medications   Medication Sig Start Date End Date Taking? Authorizing Provider  lisinopril-hydrochlorothiazide (PRINZIDE,ZESTORETIC) 20-12.5 MG per tablet Take 1 tablet by mouth daily before breakfast.    Yes Historical Provider, MD  Multiple Vitamins-Minerals (MULTIVITAMIN WITH MINERALS) tablet Take 1 tablet by mouth 2 (two) times daily.    Yes Historical Provider, MD  albuterol (PROVENTIL HFA;VENTOLIN HFA) 108 (90 BASE) MCG/ACT inhaler Inhale 2 puffs into the lungs every 6 (six) hours as needed for wheezing or shortness of breath. 07/04/13   Gregor Hams, MD  cycloSPORINE (RESTASIS) 0.05 % ophthalmic emulsion 1 drop 2 (two) times daily.    Historical Provider, MD  ezetimibe-simvastatin (VYTORIN) 10-20 MG per tablet Take 1 tablet by mouth at bedtime.    Historical Provider, MD  fluticasone (FLONASE) 50 MCG/ACT nasal spray Place 2 sprays into both nostrils daily. 07/04/13   Gregor Hams, MD  meclizine (ANTIVERT) 25 MG tablet Take 1 tablet (25 mg total) by mouth 3 (three) times daily as needed for dizziness. 07/30/15   Melony Overly, MD  ondansetron (ZOFRAN) 4 MG tablet Take 1 tablet (4 mg total) by mouth every 8 (eight) hours as needed for  nausea or vomiting. 07/30/15   Melony Overly, MD  pantoprazole (PROTONIX) 40 MG tablet Take 1 tablet (40 mg total) by mouth daily. 05/27/12   Madilyn Hook, DO   Meds Ordered and Administered this Visit   Medications  ondansetron (ZOFRAN-ODT) disintegrating tablet 4 mg (4 mg Oral Given 07/30/15 1056)    BP 143/92 mmHg  Pulse 59  Temp(Src) 98 F (36.7 C) (Oral)  Resp 18  SpO2 97% No data found.   Physical Exam  Constitutional: She is oriented to person, place, and time. She appears well-developed and well-nourished. No distress.  HENT:  TMs normal bilaterally  Cardiovascular:  Normal rate, regular rhythm and normal heart sounds.   No murmur heard. Pulmonary/Chest: Effort normal and breath sounds normal. No respiratory distress. She has no wheezes. She has no rales.  Neurological: She is alert and oriented to person, place, and time.  Positive Dix-Hallpike with nystagmus.    ED Course  Procedures (including critical care time)  Labs Review Labs Reviewed - No data to display  Imaging Review No results found.   MDM   1. BPPV (benign paroxysmal positional vertigo), unspecified laterality    Symptomatic treatment with meclizine and Zofran. Handout given on Epley maneuvers. If not improving over the next several days, follow-up with PCP.    Melony Overly, MD 07/30/15 1056

## 2015-07-30 NOTE — Discharge Instructions (Signed)
Benign Positional Vertigo Vertigo is the feeling that you or your surroundings are moving when they are not. Benign positional vertigo is the most common form of vertigo. The cause of this condition is not serious (is benign). This condition is triggered by certain movements and positions (is positional). This condition can be dangerous if it occurs while you are doing something that could endanger you or others, such as driving.  CAUSES In many cases, the cause of this condition is not known. It may be caused by a disturbance in an area of the inner ear that helps your brain to sense movement and balance. This disturbance can be caused by a viral infection (labyrinthitis), head injury, or repetitive motion. RISK FACTORS This condition is more likely to develop in: 1. Women. 2. People who are 5 years of age or older. SYMPTOMS Symptoms of this condition usually happen when you move your head or your eyes in different directions. Symptoms may start suddenly, and they usually last for less than a minute. Symptoms may include:  Loss of balance and falling.  Feeling like you are spinning or moving.  Feeling like your surroundings are spinning or moving.  Nausea and vomiting.  Blurred vision.  Dizziness.  Involuntary eye movement (nystagmus). Symptoms can be mild and cause only slight annoyance, or they can be severe and interfere with daily life. Episodes of benign positional vertigo may return (recur) over time, and they may be triggered by certain movements. Symptoms may improve over time. DIAGNOSIS This condition is usually diagnosed by medical history and a physical exam of the head, neck, and ears. You may be referred to a health care provider who specializes in ear, nose, and throat (ENT) problems (otolaryngologist) or a provider who specializes in disorders of the nervous system (neurologist). You may have additional testing, including:  MRI.  A CT scan.  Eye movement tests. Your  health care provider may ask you to change positions quickly while he or she watches you for symptoms of benign positional vertigo, such as nystagmus. Eye movement may be tested with an electronystagmogram (ENG), caloric stimulation, the Dix-Hallpike test, or the roll test.  An electroencephalogram (EEG). This records electrical activity in your brain.  Hearing tests. TREATMENT Usually, your health care provider will treat this by moving your head in specific positions to adjust your inner ear back to normal. Surgery may be needed in severe cases, but this is rare. In some cases, benign positional vertigo may resolve on its own in 2-4 weeks. HOME CARE INSTRUCTIONS Safety  Move slowly.Avoid sudden body or head movements.  Avoid driving.  Avoid operating heavy machinery.  Avoid doing any tasks that would be dangerous to you or others if a vertigo episode would occur.  If you have trouble walking or keeping your balance, try using a cane for stability. If you feel dizzy or unstable, sit down right away.  Return to your normal activities as told by your health care provider. Ask your health care provider what activities are safe for you. General Instructions  Take over-the-counter and prescription medicines only as told by your health care provider.  Avoid certain positions or movements as told by your health care provider.  Drink enough fluid to keep your urine clear or pale yellow.  Keep all follow-up visits as told by your health care provider. This is important. SEEK MEDICAL CARE IF:  You have a fever.  Your condition gets worse or you develop new symptoms.  Your family or friends  notice any behavioral changes.  Your nausea or vomiting gets worse.  You have numbness or a "pins and needles" sensation. SEEK IMMEDIATE MEDICAL CARE IF:  You have difficulty speaking or moving.  You are always dizzy.  You faint.  You develop severe headaches.  You have weakness in your  legs or arms.  You have changes in your hearing or vision.  You develop a stiff neck.  You develop sensitivity to light.   This information is not intended to replace advice given to you by your health care provider. Make sure you discuss any questions you have with your health care provider.   Document Released: 10/20/2005 Document Revised: 10/03/2014 Document Reviewed: 05/07/2014 Elsevier Interactive Patient Education 2016 [employer].  [employer] Self-Care WHAT IS THE EPLEY MANEUVER? The Epley maneuver is an exercise you can do to relieve symptoms of benign paroxysmal positional vertigo (BPPV). This condition is often just referred to as vertigo. BPPV is caused by the movement of tiny crystals (canaliths) inside your inner ear. The accumulation and movement of canaliths in your inner ear causes a sudden spinning sensation (vertigo) when you move your head to certain positions. Vertigo usually lasts about 30 seconds. BPPV usually occurs in just one ear. If you get vertigo when you lie on your left side, you probably have BPPV in your left ear. Your health care provider can tell you which ear is involved.  BPPV may be caused by a head injury. Many people older than 50 get BPPV for unknown reasons. If you have been diagnosed with BPPV, your health care provider may teach you how to do this maneuver. BPPV is not life threatening (benign) and usually goes away in time.  WHEN SHOULD I PERFORM THE EPLEY MANEUVER? You can do this maneuver at home whenever you have symptoms of vertigo. You may do the Epley maneuver up to 3 times a day until your symptoms of vertigo go away. HOW SHOULD I DO THE EPLEY MANEUVER? 3. Sit on the edge of a bed or table with your back straight. Your legs should be extended or hanging over the edge of the bed or table.  4. Turn your head halfway toward the affected ear.  5. Lie backward quickly with your head turned until you are lying flat on your back. You may want  to position a pillow under your shoulders.  6. Hold this position for 30 seconds. You may experience an attack of vertigo. This is normal. Hold this position until the vertigo stops. 7. Then turn your head to the opposite direction until your unaffected ear is facing the floor.  8. Hold this position for 30 seconds. You may experience an attack of vertigo. This is normal. Hold this position until the vertigo stops. 9. Now turn your whole body to the same side as your head. Hold for another 30 seconds.  10. You can then sit back up. ARE THERE RISKS TO THIS MANEUVER? In some cases, you may have other symptoms (such as changes in your vision, weakness, or numbness). If you have these symptoms, stop doing the maneuver and call your health care provider. Even if doing these maneuvers relieves your vertigo, you may still have dizziness. Dizziness is the sensation of light-headedness but without the sensation of movement. Even though the Epley maneuver may relieve your vertigo, it is possible that your symptoms will return within 5 years. WHAT SHOULD I DO AFTER THIS MANEUVER? After doing the Epley maneuver, you can return to your  normal activities. Ask your doctor if there is anything you should do at home to prevent vertigo. This may include:  Sleeping with two or more pillows to keep your head elevated.  Not sleeping on the side of your affected ear.  Getting up slowly from bed.  Avoiding sudden movements during the day.  Avoiding extreme head movement, like looking up or bending over.  Wearing a cervical collar to prevent sudden head movements. WHAT SHOULD I DO IF MY SYMPTOMS GET WORSE? Call your health care provider if your vertigo gets worse. Call your provider right way if you have other symptoms, including:   Nausea.  Vomiting.  Headache.  Weakness.  Numbness.  Vision changes.   This information is not intended to replace advice given to you by your health care provider. Make  sure you discuss any questions you have with your health care provider.   Document Released: 01/17/2013 Document Reviewed: 01/17/2013 Elsevier Interactive Patient Education [employer].

## 2015-07-30 NOTE — ED Notes (Signed)
The patient presented to the [facility] with a complaint of nausea and dizziness that started yesterday after an 8 hour car ride. The patient stated that she has a hx of vertigo.

## 2015-08-05 DIAGNOSIS — I10 Essential (primary) hypertension: Secondary | ICD-10-CM | POA: Diagnosis not present

## 2015-08-05 DIAGNOSIS — N390 Urinary tract infection, site not specified: Secondary | ICD-10-CM | POA: Diagnosis not present

## 2015-08-05 MED FILL — CIPROFLOXACIN HCL 500 MG TA: 500 | 7 days supply | Qty: 14 | Fill #0

## 2015-08-08 ENCOUNTER — Ambulatory Visit: Payer: 59 | Admitting: Dietician

## 2015-08-29 DIAGNOSIS — M1711 Unilateral primary osteoarthritis, right knee: Secondary | ICD-10-CM | POA: Diagnosis not present

## 2015-09-04 ENCOUNTER — Encounter: Payer: Self-pay | Admitting: Dietician

## 2015-09-04 ENCOUNTER — Encounter: Payer: 59 | Attending: Nurse Practitioner | Admitting: Dietician

## 2015-09-04 DIAGNOSIS — Z713 Dietary counseling and surveillance: Secondary | ICD-10-CM | POA: Diagnosis not present

## 2015-09-04 NOTE — Progress Notes (Signed)
  Follow-up visit: 3.5 Years Post-Operative Gastric Sleeve Surgery  Medical Nutrition Therapy:  Appt start time: [id number]   End time: 425  Primary concerns today: Post-operative Bariatric Surgery Nutrition Management. Marissa Cox returns for a follow up with a 2 lb weight loss in past 5 months. Having a lot of stress at home. Husband is still sick and had to ask son to leave her home. Feels like it is time to take care of herself. Tried to get a Physiological scientist through [employer] but they are all booked up now.   Feeling better lately, niece and her kids are gone.   Having pain in knee and cortisone shots every 4 months. Might look into doing water aerobics.   Having trouble getting her water in. If she drinks too much late gets up overnight to go to the bathroom.   Constipation is better now. Taking iron some days.   Surgery date: 12/22/11  Surgery type: Gastric Sleeve  [facility]: 298.5 lbs (08/26/11)  Last weight (Pre-Op Class): 312.6 lbs (12/03/11)  Weight today: 265 lbs   Weight change: 2 lb loss Total weight lost: 47 lbs  Goal weight: 250 lbs  24-hr recall:   B: egg, [place] sausage or protein drink and coffee or pancake and [place] patty or just a cup of coffee S: activia yogurt or banana or cottage cheese and peaches L:  4 oz of turkey/baked chicken with spinach/broccoli S: popcorn/pretzels D: meat, vegetable and sometimes rice or potatoes or vegetable lo mein  S: popcorn or yogurt parfait from [employer]  Fluid intake:  37-55.5 oz mostly water, 1 cup regular coffee Estimated total protein intake: ~ 60g  Medications: See medication list.   Supplementation:  Taking and now taking iron    Using straws: Yes Drinking while eating: No, waiting 15 minutes to drink Hair loss: No Carbonated beverages:  No N/V/D/C:  No Dumping syndrome: No  Recent physical activity:  Walking 2 x week for 60 minutes, 8000+ steps per day at work   Progress Towards Goal(s):  In  progress.  Nutritional Diagnosis:  [name]-3.3 Overweight/obesity related to past poor dietary habits and physical inactivity as evidenced by patient w/ recent Gastric Sleeve surgery following dietary guidelines for continued weight loss.    Intervention:  Nutrition education/reinforcement. Goals:  Follow Phase 3B: High Protein + Non-Starchy Vegetables  Eat 3-6 small meals/snacks, every 3-5 hrs  Increase lean protein foods to meet 60-80g goal  Increase fluid intake to 64oz +, try to sip all day long  Look into water aerobics  Take a complete multivitamin with iron (instead of gummies)  Add vegetable to lunch and dinner meals  Only have snacks when you are truly hungry  Try have snacks at table with no TV  Freeze extra vegetables from dinner   Monitoring/Evaluation:  Dietary intake, exercise, and body weight. Follow up in 3 months for 3+ year post-op visit.

## 2015-09-04 NOTE — Patient Instructions (Addendum)
Goals:  Follow Phase 3B: High Protein + Non-Starchy Vegetables  Eat 3-6 small meals/snacks, every 3-5 hrs  Increase lean protein foods to meet 60-80g goal  Increase fluid intake to 64oz +, try to sip all day long  Look into water aerobics  Take a complete multivitamin with iron (instead of gummies)  Add vegetable to lunch and dinner meals  Only have snacks when you are truly hungry  Try have snacks at table with no TV  Freeze extra vegetables from dinner

## 2015-09-05 DIAGNOSIS — H1013 Acute atopic conjunctivitis, bilateral: Secondary | ICD-10-CM | POA: Diagnosis not present

## 2015-09-10 MED FILL — HYDROCHLOROTHIAZIDE 25 MG T: 25 | 90 days supply | Qty: 90 | Fill #0

## 2015-09-10 MED FILL — LISINOPRIL 10 MG TABLET: 10 | 90 days supply | Qty: 90 | Fill #0

## 2015-11-15 ENCOUNTER — Other Ambulatory Visit: Payer: Self-pay | Admitting: Internal Medicine

## 2015-11-15 ENCOUNTER — Ambulatory Visit
Admission: RE | Admit: 2015-11-15 | Discharge: 2015-11-15 | Disposition: A | Discharge status: Home / Self Care | Payer: 59 | Source: Ambulatory Visit | Attending: Internal Medicine | Admitting: Internal Medicine

## 2015-11-15 DIAGNOSIS — Z0000 Encounter for general adult medical examination without abnormal findings: Secondary | ICD-10-CM

## 2015-11-15 DIAGNOSIS — Z6841 Body Mass Index (BMI) 40.0 and over, adult: Secondary | ICD-10-CM | POA: Diagnosis not present

## 2015-11-15 DIAGNOSIS — I10 Essential (primary) hypertension: Secondary | ICD-10-CM | POA: Diagnosis not present

## 2015-11-15 DIAGNOSIS — Z833 Family history of diabetes mellitus: Secondary | ICD-10-CM | POA: Diagnosis not present

## 2015-11-15 DIAGNOSIS — F17210 Nicotine dependence, cigarettes, uncomplicated: Secondary | ICD-10-CM | POA: Diagnosis not present

## 2015-11-15 DIAGNOSIS — Z716 Tobacco abuse counseling: Secondary | ICD-10-CM | POA: Diagnosis not present

## 2015-11-15 DIAGNOSIS — M79671 Pain in right foot: Secondary | ICD-10-CM | POA: Diagnosis not present

## 2015-11-15 DIAGNOSIS — M1990 Unspecified osteoarthritis, unspecified site: Secondary | ICD-10-CM | POA: Diagnosis not present

## 2015-11-15 DIAGNOSIS — M79672 Pain in left foot: Secondary | ICD-10-CM | POA: Diagnosis not present

## 2015-11-29 ENCOUNTER — Ambulatory Visit (INDEPENDENT_AMBULATORY_CARE_PROVIDER_SITE_OTHER): Payer: 59 | Admitting: Podiatry

## 2015-11-29 ENCOUNTER — Encounter: Payer: Self-pay | Admitting: Podiatry

## 2015-11-29 DIAGNOSIS — M79671 Pain in right foot: Secondary | ICD-10-CM | POA: Diagnosis not present

## 2015-11-29 DIAGNOSIS — M79672 Pain in left foot: Secondary | ICD-10-CM | POA: Diagnosis not present

## 2015-11-29 DIAGNOSIS — M722 Plantar fascial fibromatosis: Secondary | ICD-10-CM | POA: Diagnosis not present

## 2015-12-01 NOTE — Progress Notes (Signed)
Subjective: 55 year old female presents the office today for concerns of bilateral foot pain. She states she is on her feet for several hours of the day when she gets home her feet hurt and standing on concrete floors. She works on concrete floors which also aggravates her symptoms. 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 She denies any swelling or redness or any tingling or numbness. No recent injury or trauma. Denies any systemic complaints such as fevers, chills, nausea, vomiting. No acute changes since last appointment, and no other complaints at this time.   Objective: AAO x3, NAD DP/PT pulses palpable bilaterally, CRT less than 3 seconds There is a decrease in medial arch height upon weightbearing. States that there is no area pinpoint bony tenderness or pain the vibratory sensation however subjectively there is tenderness on the medial band plantar fasciitis on the arch of the foot as well as the heel after working all day. There is notable edema, erythema, increase in warmth. No open lesions or pre-ulcerative lesions.  No pain with calf compression, swelling, warmth, erythema  Assessment: Bilateral flatfeet likely causing biomechanical changes, pain.  Plan: -All treatment options discussed with the patient including all alternatives, risks, complications.  -X-rays and chart were reviewed. -She was scanned for orthotics and they were sent to [facility] labs. -Prescription for compression socks were also provided. -Also discussed shoe gear changes -Follow-up in 3-4 weeks to pick up inserts or sooner if needed. -Patient encouraged to call the office with any questions, concerns, change in symptoms.   Celesta Gentile, DPM

## 2015-12-05 ENCOUNTER — Ambulatory Visit: Payer: 59 | Admitting: Dietician

## 2015-12-23 ENCOUNTER — Encounter: Payer: Self-pay | Admitting: Podiatry

## 2015-12-23 ENCOUNTER — Ambulatory Visit (INDEPENDENT_AMBULATORY_CARE_PROVIDER_SITE_OTHER): Payer: 59 | Admitting: Podiatry

## 2015-12-23 DIAGNOSIS — M722 Plantar fascial fibromatosis: Secondary | ICD-10-CM

## 2015-12-23 NOTE — Patient Instructions (Signed)

## 2015-12-24 NOTE — Progress Notes (Signed)
Patient presents a pickup orthotics. Oral and written break in instructions were discussed. She was seen by Cranford Mon, CMA for dispensing.

## 2015-12-26 MED FILL — HYDROCHLOROTHIAZIDE 25 MG T: 25 | 90 days supply | Qty: 90 | Fill #1

## 2015-12-26 MED FILL — LISINOPRIL 10 MG TABLET: 10 | 90 days supply | Qty: 90 | Fill #1

## 2016-01-25 ENCOUNTER — Ambulatory Visit (HOSPITAL_COMMUNITY)
Admission: EM | Admit: 2016-01-25 | Discharge: 2016-01-25 | Disposition: A | Discharge status: Home / Self Care | Payer: 59 | Attending: Emergency Medicine | Admitting: Emergency Medicine

## 2016-01-25 ENCOUNTER — Encounter (HOSPITAL_COMMUNITY): Payer: Self-pay | Admitting: *Deleted

## 2016-01-25 DIAGNOSIS — R05 Cough: Secondary | ICD-10-CM

## 2016-01-25 DIAGNOSIS — J00 Acute nasopharyngitis [common cold]: Secondary | ICD-10-CM | POA: Diagnosis not present

## 2016-01-25 DIAGNOSIS — R059 Cough, unspecified: Secondary | ICD-10-CM

## 2016-01-25 MED ORDER — HYDROCOD POLST-CPM POLST ER 10-8 MG/5ML PO SUER: 5.0000 mL | Freq: Two times a day (BID) | ORAL | 0 refills | Status: DC | PRN

## 2016-01-25 MED ORDER — PREDNISONE 50 MG PO TABS: ORAL_TABLET | ORAL | 0 refills | Status: DC

## 2016-01-25 MED ORDER — IPRATROPIUM BROMIDE 0.06 % NA SOLN: 2.0000 | Freq: Four times a day (QID) | NASAL | 0 refills | Status: DC

## 2016-01-25 NOTE — ED Provider Notes (Signed)
[place]    CSN: [id number] Arrival date & time: 01/25/16  1333     History   Chief Complaint Chief Complaint  Patient presents with  . Cough    HPI Marissa ZOPPI is a 55 y.o. female.   HPI  She is a 55 year old woman here for evaluation of cough. Her symptoms started about 4 days ago with a scratchy throat. She went on to develop nasal congestion, rhinorrhea, and postnasal drainage. She does report a cough. This is worse at night. She reports subjective fevers and chills initially, but none in the last day or 2. No shortness of breath. She's had some intermittent discomfort of the right ear. She's been taking home remedies without improvement. No nausea or vomiting.  Past Medical History:  Diagnosis Date  . Hyperlipidemia   . Hypertension   . Morbid obesity ([place])     There are no active problems to display for this patient.   Past Surgical History:  Procedure Laterality Date  . LAPAROSCOPIC GASTRIC SLEEVE RESECTION  12/22/2011   Procedure: LAPAROSCOPIC GASTRIC SLEEVE RESECTION;  Surgeon: Madilyn Hook, DO;  Location: [facility];  Service: General;  Laterality: N/A;  . MULTIPLE TOOTH EXTRACTIONS  1981  . TUBAL LIGATION  1990  . UPPER GI ENDOSCOPY  12/22/2011   Procedure: UPPER GI ENDOSCOPY;  Surgeon: Madilyn Hook, DO;  Location: [facility];  Service: General;;    OB History    No data available       Home Medications    Prior to Admission medications   Medication Sig Start Date End Date Taking? Authorizing Provider  albuterol (PROVENTIL HFA;VENTOLIN HFA) 108 (90 BASE) MCG/ACT inhaler Inhale 2 puffs into the lungs every 6 (six) hours as needed for wheezing or shortness of breath. 07/04/13   Gregor Hams, MD  chlorpheniramine-HYDROcodone (TUSSIONEX PENNKINETIC ER) 10-8 MG/5ML SUER Take 5 mLs by mouth every 12 (twelve) hours as needed for cough. 01/25/16   Melony Overly, MD  ezetimibe-simvastatin (VYTORIN) 10-20 MG per tablet Take 1 tablet by mouth at  bedtime.    Historical Provider, MD  ipratropium (ATROVENT) 0.06 % nasal spray Place 2 sprays into both nostrils 4 (four) times daily. 01/25/16   Melony Overly, MD  lisinopril-hydrochlorothiazide (PRINZIDE,ZESTORETIC) 20-12.5 MG per tablet Take 1 tablet by mouth daily before breakfast.     Historical Provider, MD  meclizine (ANTIVERT) 25 MG tablet Take 1 tablet (25 mg total) by mouth 3 (three) times daily as needed for dizziness. 07/30/15   Melony Overly, MD  Multiple Vitamins-Minerals (MULTIVITAMIN WITH MINERALS) tablet Take 1 tablet by mouth 2 (two) times daily.     Historical Provider, MD  ondansetron (ZOFRAN) 4 MG tablet Take 1 tablet (4 mg total) by mouth every 8 (eight) hours as needed for nausea or vomiting. 07/30/15   Melony Overly, MD  predniSONE (DELTASONE) 50 MG tablet Take 1 pill daily for 5 days. 01/25/16   Melony Overly, MD    Family History Family History  Problem Relation Age of Onset  . Cancer Mother     bladder  . Cancer Maternal Aunt     breast  . Cancer Paternal Aunt     breast  . Cancer Paternal Aunt     breast  . Cancer Paternal Aunt     breast  . Cancer Maternal Aunt     ovarian    Social History Social History  Substance Use Topics  . Smoking status: Former Research scientist (life sciences)  .  Smokeless tobacco: Former Systems developer    Quit date: 08/07/2011  . Alcohol use No     Allergies   Patient has no known allergies.   Review of Systems Review of Systems As in history of present illness  Physical Exam Triage Vital Signs ED Triage Vitals  Enc Vitals Group     BP 01/25/16 1541 104/59     Pulse Rate 01/25/16 1541 64     Resp 01/25/16 1541 18     Temp 01/25/16 1541 98.5 F (36.9 C)     Temp src --      SpO2 01/25/16 1541 100 %     Weight --      Height --      Head Circumference --      Peak Flow --      Pain Score 01/25/16 1540 2     Pain Loc --      Pain Edu? --      Excl. in [place]? --    No data found.   Updated Vital Signs BP 104/59 (BP Location: Right Arm)   Pulse  64   Temp 98.5 F (36.9 C)   Resp 18   SpO2 100%   Visual Acuity Right Eye Distance:   Left Eye Distance:   Bilateral Distance:    Right Eye Near:   Left Eye Near:    Bilateral Near:     Physical Exam  Constitutional: She is oriented to person, place, and time. She appears well-developed and well-nourished. No distress.  HENT:  TMs normal bilaterally. Nasal mucosa is erythematous and boggy with clear drainage. She has moderate postnasal drainage. No oropharyngeal erythema.  Neck: Neck supple.  Cardiovascular: Normal rate, regular rhythm and normal heart sounds.   No murmur heard. Pulmonary/Chest: Effort normal and breath sounds normal. No respiratory distress. She has no wheezes. She has no rales.  Lymphadenopathy:    She has no cervical adenopathy.  Neurological: She is alert and oriented to person, place, and time. She exhibits normal muscle tone.     UC Treatments / Results  Labs (all labs ordered are listed, but only abnormal results are displayed) Labs Reviewed - No data to display  EKG  EKG Interpretation None       Radiology No results found.  Procedures Procedures (including critical care time)  Medications Ordered in UC Medications - No data to display   Initial Impression / Assessment and Plan / UC Course  I have reviewed the triage vital signs and the nursing notes.  Pertinent labs & imaging results that were available during my care of the patient were reviewed by me and considered in my medical decision making (see chart for details).  Clinical Course     Prednisone and Atrovent nasal spray to help symptomatically. Also recommended OTC allergy pill. Tussionex as needed bedtime for cough. Follow-up as needed.  Final Clinical Impressions(s) / UC Diagnoses   Final diagnoses:  Acute nasopharyngitis  Cough    New Prescriptions New Prescriptions   CHLORPHENIRAMINE-HYDROCODONE (TUSSIONEX PENNKINETIC ER) 10-8 MG/5ML SUER    Take 5 mLs by  mouth every 12 (twelve) hours as needed for cough.   IPRATROPIUM (ATROVENT) 0.06 % NASAL SPRAY    Place 2 sprays into both nostrils 4 (four) times daily.   PREDNISONE (DELTASONE) 50 MG TABLET    Take 1 pill daily for 5 days.     Melony Overly, MD 01/25/16 [phone]

## 2016-01-25 NOTE — Discharge Instructions (Signed)
You have a really bad cold that is causing your cough. Use Atrovent nasal spray 4 times a day to help with drainage. Take a daily allergy pill such as Claritin or Zyrtec. Take prednisone daily for 5 days. Use the Tussionex at bedtime as needed for cough. This medicine will make you drowsy. Follow-up as needed.

## 2016-01-25 NOTE — ED Triage Notes (Signed)
pT  REPORTS  SYMPTOMS  OF  A   SORETHROAT  SCRATCHY      SENSATION   WITH  A  PERSISTANT  COUGH  ALL  NIGHT  LONG      PT  REPORTS     THE  SYMPTOMS   NOT  RELEIVED  BY  OTC  MEDS

## 2016-01-31 DIAGNOSIS — H66001 Acute suppurative otitis media without spontaneous rupture of ear drum, right ear: Secondary | ICD-10-CM | POA: Diagnosis not present

## 2016-01-31 DIAGNOSIS — J0110 Acute frontal sinusitis, unspecified: Secondary | ICD-10-CM | POA: Diagnosis not present

## 2016-01-31 MED FILL — AMOXICILLIN 500 MG CAPSULE: 500 | 7 days supply | Qty: 21 | Fill #0

## 2016-03-20 ENCOUNTER — Other Ambulatory Visit: Payer: Self-pay | Admitting: Family

## 2016-03-20 DIAGNOSIS — Z1231 Encounter for screening mammogram for malignant neoplasm of breast: Secondary | ICD-10-CM

## 2016-03-31 MED FILL — HYDROCHLOROTHIAZIDE 25 MG T: 25 | 90 days supply | Qty: 90 | Fill #2

## 2016-03-31 MED FILL — LISINOPRIL 10 MG TABLET: 10 | 90 days supply | Qty: 90 | Fill #2

## 2016-04-08 ENCOUNTER — Ambulatory Visit: Payer: 59

## 2016-04-14 ENCOUNTER — Encounter (HOSPITAL_COMMUNITY): Payer: Self-pay | Admitting: Emergency Medicine

## 2016-04-14 ENCOUNTER — Ambulatory Visit (HOSPITAL_COMMUNITY)
Admission: EM | Admit: 2016-04-14 | Discharge: 2016-04-14 | Disposition: A | Discharge status: Home / Self Care | Payer: 59 | Source: Home / Self Care

## 2016-04-14 DIAGNOSIS — R1013 Epigastric pain: Secondary | ICD-10-CM | POA: Diagnosis not present

## 2016-04-14 DIAGNOSIS — R1011 Right upper quadrant pain: Secondary | ICD-10-CM

## 2016-04-14 DIAGNOSIS — Z79899 Other long term (current) drug therapy: Secondary | ICD-10-CM | POA: Diagnosis not present

## 2016-04-14 DIAGNOSIS — R1010 Upper abdominal pain, unspecified: Secondary | ICD-10-CM

## 2016-04-14 DIAGNOSIS — R1031 Right lower quadrant pain: Secondary | ICD-10-CM | POA: Diagnosis not present

## 2016-04-14 DIAGNOSIS — Z87891 Personal history of nicotine dependence: Secondary | ICD-10-CM | POA: Insufficient documentation

## 2016-04-14 DIAGNOSIS — I10 Essential (primary) hypertension: Secondary | ICD-10-CM | POA: Diagnosis not present

## 2016-04-14 DIAGNOSIS — R109 Unspecified abdominal pain: Secondary | ICD-10-CM

## 2016-04-14 LAB — CBC
HCT: 39.1 % (ref 36.0–46.0)
HEMOGLOBIN: 12.3 g/dL (ref 12.0–15.0)
MCH: 23.2 pg — ABNORMAL LOW (ref 26.0–34.0)
MCHC: 31.5 g/dL (ref 30.0–36.0)
MCV: 73.8 fL — ABNORMAL LOW (ref 78.0–100.0)
Platelets: 260 10*3/uL (ref 150–400)
RBC: 5.3 MIL/uL — ABNORMAL HIGH (ref 3.87–5.11)
RDW: 13.9 % (ref 11.5–15.5)
WBC: 7 10*3/uL (ref 4.0–10.5)

## 2016-04-14 LAB — COMPREHENSIVE METABOLIC PANEL
ALBUMIN: 3.6 g/dL (ref 3.5–5.0)
ALT: 14 U/L (ref 14–54)
ANION GAP: 10 (ref 5–15)
AST: 20 U/L (ref 15–41)
Alkaline Phosphatase: 78 U/L (ref 38–126)
BUN: 9 mg/dL (ref 6–20)
CHLORIDE: 101 mmol/L (ref 101–111)
CO2: 27 mmol/L (ref 22–32)
Calcium: 9.3 mg/dL (ref 8.9–10.3)
Creatinine, Ser: 1.06 mg/dL — ABNORMAL HIGH (ref 0.44–1.00)
GFR calc Af Amer: >60 mL/min (ref >60)
GFR calc non Af Amer: 58 mL/min — ABNORMAL LOW (ref >60)
GLUCOSE: 107 mg/dL — AB (ref 65–99)
POTASSIUM: 3.3 mmol/L — AB (ref 3.5–5.1)
Sodium: 138 mmol/L (ref 135–145)
Total Bilirubin: 0.6 mg/dL (ref 0.3–1.2)
Total Protein: 7.2 g/dL (ref 6.5–8.1)

## 2016-04-14 LAB — POCT URINALYSIS DIP (DEVICE)
BILIRUBIN URINE: NEGATIVE
GLUCOSE, UA: NEGATIVE mg/dL
Hgb urine dipstick: NEGATIVE
Ketones, ur: NEGATIVE mg/dL
LEUKOCYTES UA: NEGATIVE
NITRITE: NEGATIVE
Protein, ur: NEGATIVE mg/dL
Specific Gravity, Urine: 1.015 (ref 1.005–1.030)
UROBILINOGEN UA: 2 mg/dL — AB (ref 0.0–1.0)
pH: 7 (ref 5.0–8.0)

## 2016-04-14 LAB — LIPASE, BLOOD: LIPASE: 105 U/L — AB (ref 11–51)

## 2016-04-14 NOTE — ED Triage Notes (Signed)
Pt. reports mid/right abdominal pain with emesis onset 2 days ago , denies diarrhea or fever , she was seen at [facility] urgent care today / urinalysis done .

## 2016-04-14 NOTE — ED Provider Notes (Signed)
CSN: [id number]     Arrival date & time 04/14/16  1848 History   First MD Initiated Contact with Patient 04/14/16 1919     Chief Complaint  Patient presents with  . Abdominal Pain   (Consider location/radiation/quality/duration/timing/severity/associated sxs/prior Treatment) Patient c/o severe right upper quadrant abdominal pain.  She c/o nausea and fatigue.    The history is provided by the patient.  Abdominal Pain  Pain location:  RUQ Pain quality: aching   Pain radiates to:  RUQ Pain severity:  Moderate Onset quality:  Sudden Duration:  1 day Timing:  Constant Progression:  Worsening Chronicity:  New Relieved by:  Nothing Worsened by:  Nothing Associated symptoms: nausea     Past Medical History:  Diagnosis Date  . Hyperlipidemia   . Hypertension   . Morbid obesity ([place])    Past Surgical History:  Procedure Laterality Date  . LAPAROSCOPIC GASTRIC SLEEVE RESECTION  12/22/2011   Procedure: LAPAROSCOPIC GASTRIC SLEEVE RESECTION;  Surgeon: Madilyn Hook, DO;  Location: [facility];  Service: General;  Laterality: N/A;  . MULTIPLE TOOTH EXTRACTIONS  1981  . TUBAL LIGATION  1990  . UPPER GI ENDOSCOPY  12/22/2011   Procedure: UPPER GI ENDOSCOPY;  Surgeon: Madilyn Hook, DO;  Location: [facility];  Service: General;;   Family History  Problem Relation Age of Onset  . Cancer Mother     bladder  . Cancer Maternal Aunt     breast  . Cancer Paternal Aunt     breast  . Cancer Paternal Aunt     breast  . Cancer Paternal Aunt     breast  . Cancer Maternal Aunt     ovarian   Social History  Substance Use Topics  . Smoking status: Former Research scientist (life sciences)  . Smokeless tobacco: Former Systems developer    Quit date: 08/07/2011  . Alcohol use No   OB History    No data available     Review of Systems  Constitutional: Negative.   HENT: Negative.   Eyes: Negative.   Respiratory: Negative.   Cardiovascular: Negative.   Gastrointestinal: Positive for abdominal pain and nausea.  Endocrine:  Negative.   Genitourinary: Negative.   Musculoskeletal: Negative.   Skin: Negative.   Allergic/Immunologic: Negative.   Neurological: Negative.   Hematological: Negative.   Psychiatric/Behavioral: Negative.     Allergies  Patient has no known allergies.  Home Medications   Prior to Admission medications   Medication Sig Start Date End Date Taking? Authorizing Provider  lisinopril-hydrochlorothiazide (PRINZIDE,ZESTORETIC) 20-12.5 MG per tablet Take 1 tablet by mouth daily before breakfast.    Yes Historical Provider, MD  meclizine (ANTIVERT) 25 MG tablet Take 1 tablet (25 mg total) by mouth 3 (three) times daily as needed for dizziness. 07/30/15  Yes Melony Overly, MD  Multiple Vitamins-Minerals (MULTIVITAMIN WITH MINERALS) tablet Take 1 tablet by mouth 2 (two) times daily.    Yes Historical Provider, MD   Meds Ordered and Administered this Visit  Medications - No data to display  BP 133/69 (BP Location: Left Arm)   Pulse 76   Temp 98.8 F (37.1 C) (Oral)   Resp 20   SpO2 100%  No data found.   Physical Exam  Constitutional: She is oriented to person, place, and time. She appears well-developed and well-nourished.  HENT:  Head: Normocephalic and atraumatic.  Eyes: Conjunctivae and EOM are normal. Pupils are equal, round, and reactive to light.  Neck: Normal range of motion. Neck supple.  Cardiovascular: Normal rate,  regular rhythm and normal heart sounds.   Pulmonary/Chest: Effort normal and breath sounds normal.  Abdominal: There is tenderness.  Right upper quadrant tender with palpation and with guarding and positive Murphy's.  Hypoactive BS  Genitourinary:  Genitourinary Comments: No CVA tenderness bilateral  Neurological: She is alert and oriented to person, place, and time.  Nursing note and vitals reviewed.   Urgent Care Course     Procedures (including critical care time)  Labs Review Labs Reviewed  POCT URINALYSIS DIP (DEVICE) - Abnormal; Notable for the  following:       Result Value   Urobilinogen, UA 2.0 (*)    All other components within normal limits    Imaging Review No results found.   Visual Acuity Review  Right Eye Distance:   Left Eye Distance:   Bilateral Distance:    Right Eye Near:   Left Eye Near:    Bilateral Near:         MDM   1. Pain of upper abdomen   2. Acute abdominal pain    DC And Follow up in ED for higher level of care.      Lysbeth Penner, FNP 04/14/16 [place], [place] 04/14/16 2101

## 2016-04-14 NOTE — ED Triage Notes (Signed)
The patient presented to the [facility] with right side upper abdominal pain that radiates in her lower right side and her back that started 2 days ago.

## 2016-04-14 NOTE — Discharge Instructions (Signed)
Please go straight to ED

## 2016-04-15 ENCOUNTER — Emergency Department (HOSPITAL_COMMUNITY): Payer: 59

## 2016-04-15 ENCOUNTER — Emergency Department (HOSPITAL_COMMUNITY)
Admission: EM | Admit: 2016-04-15 | Discharge: 2016-04-15 | Disposition: A | Discharge status: Home / Self Care | Payer: 59 | Attending: Emergency Medicine | Admitting: Emergency Medicine

## 2016-04-15 DIAGNOSIS — R1013 Epigastric pain: Secondary | ICD-10-CM

## 2016-04-15 DIAGNOSIS — R1011 Right upper quadrant pain: Secondary | ICD-10-CM | POA: Diagnosis not present

## 2016-04-15 DIAGNOSIS — I10 Essential (primary) hypertension: Secondary | ICD-10-CM | POA: Diagnosis not present

## 2016-04-15 DIAGNOSIS — Z79899 Other long term (current) drug therapy: Secondary | ICD-10-CM | POA: Diagnosis not present

## 2016-04-15 DIAGNOSIS — R109 Unspecified abdominal pain: Secondary | ICD-10-CM | POA: Diagnosis not present

## 2016-04-15 DIAGNOSIS — Z87891 Personal history of nicotine dependence: Secondary | ICD-10-CM | POA: Diagnosis not present

## 2016-04-15 MED ORDER — IOPAMIDOL (ISOVUE-300) INJECTION 61%
INTRAVENOUS | Status: AC
  Administered 2016-04-15: 100 mL
  Filled 2016-04-15: qty 100

## 2016-04-15 MED ORDER — TRAMADOL HCL 50 MG PO TABS: 50.0000 mg | ORAL_TABLET | Freq: Four times a day (QID) | ORAL | 0 refills | Status: DC | PRN

## 2016-04-15 NOTE — ED Notes (Signed)
EDP at bedside  

## 2016-04-15 NOTE — ED Provider Notes (Signed)
[facility] DEPT Provider Note   CSN: [id number] Arrival date & time: 04/14/16  1952   By signing my name below, I, Soijett Blue, attest that this documentation has been prepared under the direction and in the presence of Orpah Greek, MD. Electronically Signed: [place], ED Scribe. 04/15/16. 3:11 AM.  History   Chief Complaint Chief Complaint  Patient presents with  . Abdominal Pain    HPI Marissa Cox is a 56 y.o. female with a PMHx of HTN, who presents to the Emergency Department complaining of RUQ abdominal pain onset 2 days ago. Pt reports associated vomiting. Pt has not tried any medications for the relief of her symptoms. She notes that she was evaluated at Urgent Care and sent into the ED for further evaluation of her symptoms. Denies fever, chills, and any other symptoms. Denies ETOH consumption.    The history is provided by the patient. No language interpreter was used.    Past Medical History:  Diagnosis Date  . Hyperlipidemia   . Hypertension   . Morbid obesity ([place])     There are no active problems to display for this patient.   Past Surgical History:  Procedure Laterality Date  . LAPAROSCOPIC GASTRIC SLEEVE RESECTION  12/22/2011   Procedure: LAPAROSCOPIC GASTRIC SLEEVE RESECTION;  Surgeon: Madilyn Hook, DO;  Location: [facility];  Service: General;  Laterality: N/A;  . MULTIPLE TOOTH EXTRACTIONS  1981  . TUBAL LIGATION  1990  . UPPER GI ENDOSCOPY  12/22/2011   Procedure: UPPER GI ENDOSCOPY;  Surgeon: Madilyn Hook, DO;  Location: [facility];  Service: General;;    OB History    No data available       Home Medications    Prior to Admission medications   Medication Sig Start Date End Date Taking? Authorizing Provider  lisinopril-hydrochlorothiazide (PRINZIDE,ZESTORETIC) 20-12.5 MG per tablet Take 1 tablet by mouth daily before breakfast.     Historical Provider, MD  meclizine (ANTIVERT) 25 MG tablet Take 1 tablet (25 mg total) by mouth  3 (three) times daily as needed for dizziness. 07/30/15   Melony Overly, MD  Multiple Vitamins-Minerals (MULTIVITAMIN WITH MINERALS) tablet Take 1 tablet by mouth 2 (two) times daily.     Historical Provider, MD    Family History Family History  Problem Relation Age of Onset  . Cancer Mother     bladder  . Cancer Maternal Aunt     breast  . Cancer Paternal Aunt     breast  . Cancer Paternal Aunt     breast  . Cancer Paternal Aunt     breast  . Cancer Maternal Aunt     ovarian    Social History Social History  Substance Use Topics  . Smoking status: Former Research scientist (life sciences)  . Smokeless tobacco: Former Systems developer    Quit date: 08/07/2011  . Alcohol use No     Allergies   Patient has no known allergies.   Review of Systems Review of Systems  Constitutional: Negative for chills and fever.  Gastrointestinal: Positive for abdominal pain (RUQ) and vomiting.  All other systems reviewed and are negative.    Physical Exam Updated Vital Signs BP (!) 120/57   Pulse 65   Temp 98.6 F (37 C) (Oral)   Resp 18   SpO2 98%   Physical Exam  Constitutional: She is oriented to person, place, and time. She appears well-developed and well-nourished. No distress.  HENT:  Head: Normocephalic and atraumatic.  Right Ear: Hearing  normal.  Left Ear: Hearing normal.  Nose: Nose normal.  Mouth/Throat: Oropharynx is clear and moist and mucous membranes are normal.  Eyes: Conjunctivae and EOM are normal. Pupils are equal, round, and reactive to light.  Neck: Normal range of motion. Neck supple.  Cardiovascular: Regular rhythm, S1 normal and S2 normal.  Exam reveals no gallop and no friction rub.   No murmur heard. Pulmonary/Chest: Effort normal and breath sounds normal. No respiratory distress. She exhibits no tenderness.  Abdominal: Soft. Normal appearance and bowel sounds are normal. There is no hepatosplenomegaly. There is tenderness in the epigastric area. There is no rebound, no guarding, no  tenderness at McBurney's point and negative Murphy's sign. No hernia.  Epigastric tenderness. No rebound or guarding.  Musculoskeletal: Normal range of motion.  Neurological: She is alert and oriented to person, place, and time. She has normal strength. No cranial nerve deficit or sensory deficit. Coordination normal. GCS eye subscore is 4. GCS verbal subscore is 5. GCS motor subscore is 6.  Skin: Skin is warm, dry and intact. No rash noted. No cyanosis.  Psychiatric: She has a normal mood and affect. Her speech is normal and behavior is normal. Thought content normal.  Nursing note and vitals reviewed.    ED Treatments / Results  DIAGNOSTIC STUDIES: Oxygen Saturation is 98% on RA, nl by my interpretation.    COORDINATION OF CARE: 3:08 AM Discussed treatment plan with pt at bedside which includes labs, US abdomen RUQ, CT abdomen pelvis scan, and pt agreed to plan.   Labs (all labs ordered are listed, but only abnormal results are displayed) Labs Reviewed  LIPASE, BLOOD - Abnormal; Notable for the following:       Result Value   Lipase 105 (*)    All other components within normal limits  COMPREHENSIVE METABOLIC PANEL - Abnormal; Notable for the following:    Potassium 3.3 (*)    Glucose, Bld 107 (*)    Creatinine, Ser 1.06 (*)    GFR calc non Af Amer 58 (*)    All other components within normal limits  CBC - Abnormal; Notable for the following:    RBC 5.30 (*)    MCV 73.8 (*)    MCH 23.2 (*)    All other components within normal limits    EKG  EKG Interpretation None       Radiology US Abdomen Limited Ruq  Result Date: 04/15/2016 CLINICAL DATA:  56 year old female with abdominal pain. EXAM: US ABDOMEN LIMITED - RIGHT UPPER QUADRANT COMPARISON:  None. FINDINGS: Gallbladder: No gallstones or wall thickening visualized. No sonographic Murphy sign noted by sonographer. Common bile duct: Diameter: 3 mm Liver: Diffuse increased hepatic echogenicity consistent with fatty  infiltration. Underlying inflammatory or fibrotic process is not excluded. The main portal vein is patent with appropriate flow direction. IMPRESSION: Fatty liver otherwise unremarkable right upper quadrant ultrasound. Electronically Signed   By: Anner Crete M.D.   On: 04/15/2016 00:59    Procedures Procedures (including critical care time)  Medications Ordered in ED Medications - No data to display   Initial Impression / Assessment and Plan / ED Course  I have reviewed the triage vital signs and the nursing notes.  Pertinent labs & imaging results that were available during my care of the patient were reviewed by me and considered in my medical decision making (see chart for details).       Patient presents to the emergency room for evaluation of abdominal pain. Patient was  referred from urgent care. Patient has been experiencing epigastric and right upper abdominal pain. Lab work was unremarkable other than very slightly elevated lipase of unclear etiology and significance. Ultrasound of gallbladder was normal. Patient has had a previous gastric sleeve procedure. She therefore underwent CT scan. No acute abnormality noted including no inflammatory changes around the pancreas. Patient reassured, no further treatment necessary. Follow-up with her bariatric surgeon.  Final Clinical Impressions(s) / ED Diagnoses   Final diagnoses:  RUQ abdominal pain    New Prescriptions New Prescriptions   No medications on file   I personally performed the services described in this documentation, which was scribed in my presence. The recorded information has been reviewed and is accurate.     Orpah Greek, MD 04/15/16 [phone]

## 2016-04-17 DIAGNOSIS — E876 Hypokalemia: Secondary | ICD-10-CM | POA: Diagnosis not present

## 2016-04-17 DIAGNOSIS — R1011 Right upper quadrant pain: Secondary | ICD-10-CM | POA: Diagnosis not present

## 2016-04-17 DIAGNOSIS — K2900 Acute gastritis without bleeding: Secondary | ICD-10-CM | POA: Diagnosis not present

## 2016-04-17 MED FILL — ONDANSETRON HCL 8 MG TABLET: 8 | 10 days supply | Qty: 30 | Fill #0

## 2016-04-17 MED FILL — PANTOPRAZOLE SOD DR 40 MG T: 40 | 30 days supply | Qty: 30 | Fill #0

## 2016-04-27 ENCOUNTER — Ambulatory Visit: Payer: 59

## 2016-04-28 DIAGNOSIS — D649 Anemia, unspecified: Secondary | ICD-10-CM | POA: Diagnosis not present

## 2016-04-28 DIAGNOSIS — R1013 Epigastric pain: Secondary | ICD-10-CM | POA: Diagnosis not present

## 2016-05-01 MED FILL — KLOR-CON M20 TABLET: 20 | 3 days supply | Qty: 10 | Fill #0

## 2016-05-13 ENCOUNTER — Ambulatory Visit: Payer: 59

## 2016-05-21 MED FILL — PANTOPRAZOLE SOD DR 40 MG T: 40 | 30 days supply | Qty: 30 | Fill #1

## 2016-05-28 DIAGNOSIS — Z09 Encounter for follow-up examination after completed treatment for conditions other than malignant neoplasm: Secondary | ICD-10-CM | POA: Diagnosis not present

## 2016-05-28 DIAGNOSIS — E279 Disorder of adrenal gland, unspecified: Secondary | ICD-10-CM | POA: Diagnosis not present

## 2016-05-28 DIAGNOSIS — Z720 Tobacco use: Secondary | ICD-10-CM | POA: Diagnosis not present

## 2016-05-28 DIAGNOSIS — R1011 Right upper quadrant pain: Secondary | ICD-10-CM | POA: Diagnosis not present

## 2016-05-28 DIAGNOSIS — K912 Postsurgical malabsorption, not elsewhere classified: Secondary | ICD-10-CM | POA: Diagnosis not present

## 2016-05-29 ENCOUNTER — Other Ambulatory Visit (HOSPITAL_COMMUNITY): Payer: Self-pay | Admitting: General Surgery

## 2016-05-29 DIAGNOSIS — R1011 Right upper quadrant pain: Secondary | ICD-10-CM

## 2016-06-04 ENCOUNTER — Other Ambulatory Visit (HOSPITAL_COMMUNITY): Payer: Self-pay | Admitting: General Surgery

## 2016-06-04 DIAGNOSIS — E279 Disorder of adrenal gland, unspecified: Secondary | ICD-10-CM

## 2016-06-05 ENCOUNTER — Encounter (HOSPITAL_COMMUNITY)
Admission: RE | Admit: 2016-06-05 | Discharge: 2016-06-05 | Disposition: A | Discharge status: Home / Self Care | Payer: 59 | Source: Ambulatory Visit | Attending: General Surgery | Admitting: General Surgery

## 2016-06-05 DIAGNOSIS — R1011 Right upper quadrant pain: Secondary | ICD-10-CM | POA: Diagnosis not present

## 2016-06-05 MED ORDER — TECHNETIUM TC 99M MEBROFENIN IV KIT
5.0000 | PACK | Freq: Once | INTRAVENOUS | Status: AC | PRN
  Administered 2016-06-05: 5 via INTRAVENOUS

## 2016-06-08 DIAGNOSIS — H5203 Hypermetropia, bilateral: Secondary | ICD-10-CM | POA: Diagnosis not present

## 2016-06-15 ENCOUNTER — Ambulatory Visit
Admission: RE | Admit: 2016-06-15 | Discharge: 2016-06-15 | Disposition: A | Discharge status: Home / Self Care | Payer: 59 | Source: Ambulatory Visit | Attending: Family | Admitting: Family

## 2016-06-15 DIAGNOSIS — Z1231 Encounter for screening mammogram for malignant neoplasm of breast: Secondary | ICD-10-CM | POA: Diagnosis not present

## 2016-06-16 ENCOUNTER — Ambulatory Visit (HOSPITAL_COMMUNITY)
Admission: RE | Admit: 2016-06-16 | Discharge: 2016-06-16 | Disposition: A | Discharge status: Home / Self Care | Payer: 59 | Source: Ambulatory Visit | Attending: General Surgery | Admitting: General Surgery

## 2016-06-16 ENCOUNTER — Encounter (HOSPITAL_COMMUNITY): Payer: Self-pay

## 2016-06-16 DIAGNOSIS — E279 Disorder of adrenal gland, unspecified: Secondary | ICD-10-CM

## 2016-06-16 MED ORDER — IOPAMIDOL (ISOVUE-300) INJECTION 61%
INTRAVENOUS | Status: AC
  Filled 2016-06-16: qty 100

## 2016-06-18 ENCOUNTER — Ambulatory Visit (HOSPITAL_COMMUNITY)
Admission: RE | Admit: 2016-06-18 | Discharge: 2016-06-18 | Disposition: A | Discharge status: Home / Self Care | Payer: 59 | Source: Ambulatory Visit | Attending: General Surgery | Admitting: General Surgery

## 2016-06-18 DIAGNOSIS — D3500 Benign neoplasm of unspecified adrenal gland: Secondary | ICD-10-CM | POA: Diagnosis not present

## 2016-06-18 DIAGNOSIS — E279 Disorder of adrenal gland, unspecified: Secondary | ICD-10-CM | POA: Diagnosis not present

## 2016-06-18 MED ORDER — IOPAMIDOL (ISOVUE-300) INJECTION 61%
INTRAVENOUS | Status: AC
  Administered 2016-06-18: 100 mL
  Filled 2016-06-18: qty 100

## 2016-06-19 DIAGNOSIS — D509 Iron deficiency anemia, unspecified: Secondary | ICD-10-CM | POA: Diagnosis not present

## 2016-06-19 DIAGNOSIS — R1013 Epigastric pain: Secondary | ICD-10-CM | POA: Diagnosis not present

## 2016-06-19 DIAGNOSIS — I10 Essential (primary) hypertension: Secondary | ICD-10-CM | POA: Diagnosis not present

## 2016-07-02 MED FILL — LISINOPRIL 10 MG TABLET: 10 | 90 days supply | Qty: 90 | Fill #0

## 2016-07-02 MED FILL — HYDROCHLOROTHIAZIDE 25 MG T: 25 | 90 days supply | Qty: 90 | Fill #0

## 2016-07-03 MED FILL — PANTOPRAZOLE SOD DR 40 MG T: 40 | 30 days supply | Qty: 30 | Fill #0

## 2016-08-19 MED FILL — PANTOPRAZOLE SOD DR 40 MG T: 40 | 30 days supply | Qty: 30 | Fill #1

## 2016-09-21 DIAGNOSIS — K529 Noninfective gastroenteritis and colitis, unspecified: Secondary | ICD-10-CM | POA: Diagnosis not present

## 2016-09-21 DIAGNOSIS — R51 Headache: Secondary | ICD-10-CM | POA: Diagnosis not present

## 2016-09-21 DIAGNOSIS — D508 Other iron deficiency anemias: Secondary | ICD-10-CM | POA: Diagnosis not present

## 2016-09-21 MED FILL — FLUTICASONE PROP 50 MCG SPR: 50 | 30 days supply | Qty: 16 | Fill #0

## 2016-09-21 MED FILL — OMEPRAZOLE 20 MG CAP: 20 | 30 days supply | Qty: 30 | Fill #0

## 2016-10-12 DIAGNOSIS — B372 Candidiasis of skin and nail: Secondary | ICD-10-CM | POA: Diagnosis not present

## 2016-10-12 DIAGNOSIS — D3502 Benign neoplasm of left adrenal gland: Secondary | ICD-10-CM | POA: Diagnosis not present

## 2016-10-12 DIAGNOSIS — R110 Nausea: Secondary | ICD-10-CM | POA: Diagnosis not present

## 2016-10-12 DIAGNOSIS — M545 Low back pain: Secondary | ICD-10-CM | POA: Diagnosis not present

## 2016-10-12 DIAGNOSIS — Z9889 Other specified postprocedural states: Secondary | ICD-10-CM | POA: Diagnosis not present

## 2016-10-12 MED FILL — LISINOPRIL 10 MG TABLET: 10 | 90 days supply | Qty: 90 | Fill #1

## 2016-10-12 MED FILL — ONDANSETRON ODT 4 MG TABLET: 4 | 5 days supply | Qty: 30 | Fill #0

## 2016-10-12 MED FILL — HYDROCHLOROTHIAZIDE 25 MG T: 25 | 90 days supply | Qty: 90 | Fill #1

## 2016-10-14 MED FILL — CREON DR 24,000 UNITS CAP: 24000-76000 | 30 days supply | Qty: 90 | Fill #0

## 2016-10-19 DIAGNOSIS — R1011 Right upper quadrant pain: Secondary | ICD-10-CM | POA: Diagnosis not present

## 2016-10-19 DIAGNOSIS — R112 Nausea with vomiting, unspecified: Secondary | ICD-10-CM | POA: Diagnosis not present

## 2016-11-02 MED FILL — OMEPRAZOLE 20 MG CAP: 20 | 30 days supply | Qty: 30 | Fill #1

## 2016-11-16 DIAGNOSIS — F17210 Nicotine dependence, cigarettes, uncomplicated: Secondary | ICD-10-CM | POA: Diagnosis not present

## 2016-11-16 DIAGNOSIS — Z23 Encounter for immunization: Secondary | ICD-10-CM | POA: Diagnosis not present

## 2016-11-16 DIAGNOSIS — Z6841 Body Mass Index (BMI) 40.0 and over, adult: Secondary | ICD-10-CM | POA: Diagnosis not present

## 2016-11-16 DIAGNOSIS — R109 Unspecified abdominal pain: Secondary | ICD-10-CM | POA: Diagnosis not present

## 2016-11-16 DIAGNOSIS — Z13220 Encounter for screening for lipoid disorders: Secondary | ICD-10-CM | POA: Diagnosis not present

## 2016-11-16 DIAGNOSIS — Z1211 Encounter for screening for malignant neoplasm of colon: Secondary | ICD-10-CM | POA: Diagnosis not present

## 2016-11-16 DIAGNOSIS — D509 Iron deficiency anemia, unspecified: Secondary | ICD-10-CM | POA: Diagnosis not present

## 2016-11-16 DIAGNOSIS — M1990 Unspecified osteoarthritis, unspecified site: Secondary | ICD-10-CM | POA: Diagnosis not present

## 2016-11-16 DIAGNOSIS — I10 Essential (primary) hypertension: Secondary | ICD-10-CM | POA: Diagnosis not present

## 2016-11-16 DIAGNOSIS — Z0000 Encounter for general adult medical examination without abnormal findings: Secondary | ICD-10-CM | POA: Diagnosis not present

## 2016-11-19 MED FILL — PRAVASTATIN SODIUM 20 MG TA: 20 | 30 days supply | Qty: 30 | Fill #0

## 2016-11-27 DIAGNOSIS — K3189 Other diseases of stomach and duodenum: Secondary | ICD-10-CM | POA: Diagnosis not present

## 2016-11-27 DIAGNOSIS — R1011 Right upper quadrant pain: Secondary | ICD-10-CM | POA: Diagnosis not present

## 2016-11-27 DIAGNOSIS — K319 Disease of stomach and duodenum, unspecified: Secondary | ICD-10-CM | POA: Diagnosis not present

## 2016-12-07 MED FILL — OMEPRAZOLE 20 MG CAP: 20 | 30 days supply | Qty: 30 | Fill #0

## 2016-12-14 DIAGNOSIS — F4321 Adjustment disorder with depressed mood: Secondary | ICD-10-CM | POA: Diagnosis not present

## 2016-12-14 DIAGNOSIS — F17210 Nicotine dependence, cigarettes, uncomplicated: Secondary | ICD-10-CM | POA: Diagnosis not present

## 2016-12-14 MED FILL — ESCITALOPRAM 10 MG TABLET: 10 | 30 days supply | Qty: 30 | Fill #0

## 2016-12-14 MED FILL — ALPRAZolam 0.5 MG TABS: 0.5 | 30 days supply | Qty: 60 | Fill #0

## 2017-01-01 MED FILL — PRAVASTATIN SODIUM 20 MG TA: 20 | 30 days supply | Qty: 30 | Fill #1

## 2017-01-04 MED FILL — FLUTICASONE PROP 50 MCG SPR: 50 | 30 days supply | Qty: 16 | Fill #1

## 2017-01-13 MED FILL — OMEPRAZOLE 20 MG CAP: 20 | 30 days supply | Qty: 30 | Fill #1

## 2017-01-13 MED FILL — ESCITALOPRAM 10 MG TABLET: 10 | 30 days supply | Qty: 30 | Fill #1

## 2017-01-13 MED FILL — LISINOPRIL 10 MG TABS: 10 | 90 days supply | Qty: 90 | Fill #0

## 2017-01-13 MED FILL — HYDROCHLOROTHIAZIDE 25 MG T: 25 | 90 days supply | Qty: 90 | Fill #0

## 2017-02-10 MED FILL — PRAVASTATIN SODIUM 20 MG TA: 20 | 30 days supply | Qty: 30 | Fill #2

## 2017-02-15 MED FILL — FLUTICASONE PROP 50 MCG SPR: 50 | 30 days supply | Qty: 16 | Fill #2

## 2017-02-15 MED FILL — ESCITALOPRAM 10 MG TABLET: 10 | 30 days supply | Qty: 30 | Fill #2

## 2017-02-15 MED FILL — OMEPRAZOLE 20 MG CAP: 20 | 30 days supply | Qty: 30 | Fill #2

## 2017-03-26 DIAGNOSIS — F17210 Nicotine dependence, cigarettes, uncomplicated: Secondary | ICD-10-CM | POA: Diagnosis not present

## 2017-03-26 DIAGNOSIS — R51 Headache: Secondary | ICD-10-CM | POA: Diagnosis not present

## 2017-03-26 DIAGNOSIS — E785 Hyperlipidemia, unspecified: Secondary | ICD-10-CM | POA: Diagnosis not present

## 2017-03-26 DIAGNOSIS — I10 Essential (primary) hypertension: Secondary | ICD-10-CM | POA: Diagnosis not present

## 2017-03-26 MED FILL — NICOTINE 14 MG/24HR PATCH: 14 | 28 days supply | Qty: 28 | Fill #0

## 2017-03-26 MED FILL — PRAVASTATIN SODIUM 20 MG TA: 20 | 30 days supply | Qty: 30 | Fill #0

## 2017-03-26 MED FILL — MOMETASONE FUROATE 50 MCG S: 50 | 30 days supply | Qty: 17 | Fill #0

## 2017-03-26 MED FILL — HYDROCHLOROTHIAZIDE 25 MG T: 25 | 90 days supply | Qty: 90 | Fill #0

## 2017-03-26 MED FILL — LISINOPRIL 10 MG TABS: 10 | 90 days supply | Qty: 90 | Fill #0

## 2017-04-26 DIAGNOSIS — J0101 Acute recurrent maxillary sinusitis: Secondary | ICD-10-CM | POA: Diagnosis not present

## 2017-04-26 MED FILL — AMOXICILLIN 500 MG CAPSULE: 500 | 10 days supply | Qty: 30 | Fill #0

## 2017-06-20 ENCOUNTER — Other Ambulatory Visit: Payer: Self-pay

## 2017-06-20 ENCOUNTER — Ambulatory Visit (HOSPITAL_COMMUNITY)
Admission: EM | Admit: 2017-06-20 | Discharge: 2017-06-20 | Disposition: A | Discharge status: Home / Self Care | Payer: 59 | Attending: Physician Assistant | Admitting: Physician Assistant

## 2017-06-20 ENCOUNTER — Encounter (HOSPITAL_COMMUNITY): Payer: Self-pay | Admitting: *Deleted

## 2017-06-20 DIAGNOSIS — R05 Cough: Secondary | ICD-10-CM | POA: Diagnosis not present

## 2017-06-20 DIAGNOSIS — F17210 Nicotine dependence, cigarettes, uncomplicated: Secondary | ICD-10-CM

## 2017-06-20 DIAGNOSIS — R059 Cough, unspecified: Secondary | ICD-10-CM

## 2017-06-20 MED ORDER — PREDNISONE 20 MG PO TABS: ORAL_TABLET | ORAL | 0 refills | Status: AC

## 2017-06-20 MED ORDER — DOXYCYCLINE HYCLATE 100 MG PO CAPS: 100.0000 mg | ORAL_CAPSULE | Freq: Two times a day (BID) | ORAL | 0 refills | Status: AC

## 2017-06-20 NOTE — ED Provider Notes (Signed)
06/20/2017 1:46 PM   DOB: 10-16-1960 / MRN: [id number]  SUBJECTIVE:  Marissa Cox is a 57 y.o. female presenting for cough productive of green thick sputum.  Patient does have a history of smoking.  She been smoking since she was 13.  She tells me she smokes roughly half pack a day.  She associates nasal congestion and sore throat as well.  She denies fever, presyncope, chest pain, DOE, leg swelling.  She has No Known Allergies.   She  has a past medical history of Hyperlipidemia, Hypertension, and Morbid obesity ([place]).    She  reports that she has been smoking.  She has been smoking about 0.50 packs per day. She quit smokeless tobacco use about 5 years ago. She reports that she does not drink alcohol or use drugs. She  has no sexual activity history on file. The patient  has a past surgical history that includes Tubal ligation (1990); Multiple tooth extractions (1981); Laparoscopic gastric sleeve resection (12/22/2011); and Upper gi endoscopy (12/22/2011).  Her family history includes Breast cancer in her maternal aunt, paternal aunt, and paternal aunt; Cancer in her maternal aunt, maternal aunt, mother, paternal aunt, paternal aunt, and paternal aunt.  ROS per HPI  OBJECTIVE:  BP (!) 127/56 (BP Location: Left Arm)   Pulse 69   Temp 98.8 F (37.1 C) (Oral)   Wt Readings from Last 3 Encounters:  09/04/15 265 lb 3.2 oz (120.3 kg)  04/24/15 267 lb (121.1 kg)  03/05/14 270 lb 4.8 oz (122.6 kg)   Temp Readings from Last 3 Encounters:  06/20/17 98.8 F (37.1 C) (Oral)  04/15/16 98.2 F (36.8 C)  04/14/16 98.8 F (37.1 C) (Oral)   BP Readings from Last 3 Encounters:  06/20/17 (!) 127/56  04/15/16 120/72  04/14/16 133/69   Pulse Readings from Last 3 Encounters:  06/20/17 69  04/15/16 (!) 50  04/14/16 76    Physical Exam  Constitutional: She is oriented to person, place, and time. She appears well-nourished.  Non-toxic appearance. No distress.  Eyes: Pupils are equal,  round, and reactive to light. EOM are normal.  Cardiovascular: Normal rate, regular rhythm, S1 normal, S2 normal, normal heart sounds and intact distal pulses. Exam reveals no gallop, no friction rub and no decreased pulses.  No murmur heard. Pulmonary/Chest: Effort normal. No stridor. No respiratory distress. She has no wheezes. She has no rales.  Abdominal: She exhibits no distension.  Musculoskeletal: She exhibits no edema.  Neurological: She is alert and oriented to person, place, and time. No cranial nerve deficit. Gait normal.  Skin: Skin is warm and dry. She is not diaphoretic. No pallor.  Psychiatric: She has a normal mood and affect.  Vitals reviewed.  CMP Latest Ref Rng & Units 04/14/2016 03/22/2013 05/05/2012  Glucose 65 - 99 mg/dL 107(H) 96 75  BUN 6 - 20 mg/dL 9 14 15   Creatinine 0.44 - 1.00 mg/dL 1.06(H) 1.00 0.91  Sodium 135 - 145 mmol/L 138 141 141  Potassium 3.5 - 5.1 mmol/L 3.3(L) 3.7 3.6  Chloride 101 - 111 mmol/L 101 100 105  CO2 22 - 32 mmol/L 27 - 27  Calcium 8.9 - 10.3 mg/dL 9.3 - 9.5  Total Protein 6.5 - 8.1 g/dL 7.2 - 6.6  Total Bilirubin 0.3 - 1.2 mg/dL 0.6 - 0.4  Alkaline Phos 38 - 126 U/L 78 - 90  AST 15 - 41 U/L 20 - 19  ALT 14 - 54 U/L 14 - 13  No results found for this or any previous visit (from the past 72 hour(s)).  No results found.  ASSESSMENT AND PLAN:   Cough: Likely some COPD component given long history of COPD.   Moderate smoker (20 or less per day)    Discharge Instructions     Please start the prednisone and antibiotic when you get home.  I expect that you will be feeling better tomorrow with more improvement to come in the next few days.  Please continue any at home therapies however do not take NSAIDs such as ibuprofen, Aleve, aspirin, naproxen, Motrin while taking prednisone.        The patient is advised to call or return to clinic if she does not see an improvement in symptoms, or to seek the care of the closest  emergency department if she worsens with the above plan.   Marissa Cox, MHS, PA-C 06/20/2017 1:46 PM   Marissa Coop, PA-C 06/20/17 1347

## 2017-06-20 NOTE — ED Triage Notes (Signed)
Per pt she feels like she have a sinus infection and bronchitis, per pt she is cold

## 2017-06-20 NOTE — Discharge Instructions (Addendum)
Please start the prednisone and antibiotic when you get home.  I expect that you will be feeling better tomorrow with more improvement to come in the next few days.  Please continue any at home therapies however do not take NSAIDs such as ibuprofen, Aleve, aspirin, naproxen, Motrin while taking prednisone.

## 2017-07-05 MED FILL — HYDROCHLOROTHIAZIDE 25 MG T: 25 | 90 days supply | Qty: 90 | Fill #1

## 2017-07-05 MED FILL — LISINOPRIL 10 MG TABLET: 10 | 90 days supply | Qty: 90 | Fill #1

## 2017-07-09 ENCOUNTER — Other Ambulatory Visit: Payer: Self-pay | Admitting: Occupational Medicine

## 2017-07-09 ENCOUNTER — Ambulatory Visit: Payer: Self-pay

## 2017-07-09 DIAGNOSIS — R0781 Pleurodynia: Secondary | ICD-10-CM

## 2017-07-15 ENCOUNTER — Other Ambulatory Visit: Payer: Self-pay | Admitting: Internal Medicine

## 2017-07-15 DIAGNOSIS — Z1231 Encounter for screening mammogram for malignant neoplasm of breast: Secondary | ICD-10-CM

## 2017-07-26 DIAGNOSIS — I10 Essential (primary) hypertension: Secondary | ICD-10-CM | POA: Diagnosis not present

## 2017-07-26 DIAGNOSIS — R600 Localized edema: Secondary | ICD-10-CM | POA: Diagnosis not present

## 2017-07-26 DIAGNOSIS — J410 Simple chronic bronchitis: Secondary | ICD-10-CM | POA: Diagnosis not present

## 2017-07-26 DIAGNOSIS — Z1159 Encounter for screening for other viral diseases: Secondary | ICD-10-CM | POA: Diagnosis not present

## 2017-07-26 DIAGNOSIS — F17210 Nicotine dependence, cigarettes, uncomplicated: Secondary | ICD-10-CM | POA: Diagnosis not present

## 2017-07-26 MED FILL — VENTOLIN HFA 90 MCG INHALER: 108 (90 BAS | 25 days supply | Qty: 18 | Fill #0

## 2017-07-31 IMAGING — NM NM HEPATO W/GB/PHARM/[PERSON_NAME]
3 series · 13 of 13 positions shown · non-contrast
Comparison: Abdomen CT dated 04/15/2016.

ADDENDUM:
On review of the abdomen CT dated 04/15/2016, there were subtle
peripancreatic inflammatory changes involving the head of the
pancreas. The patient's lipase was also elevated at that time,
compatible with acute pancreatitis.
CLINICAL DATA: Right upper quadrant abdominal pain, nausea and
vomiting for the past 2 months.

EXAM:
NUCLEAR MEDICINE HEPATOBILIARY IMAGING WITH GALLBLADDER EF
TECHNIQUE: Sequential images of the abdomen were obtained [DATE] minutes
following intravenous administration of radiopharmaceutical. After
oral ingestion of 8 ounces of Half-and-Half cream, gallbladder
ejection fraction was determined.
RADIOPHARMACEUTICALS:  4.9 mCi Ic-AAm Choletec IV

[he hepatobiliary · 2.51mm/px · 1 of 1 slices shown (1 of 3)]
[im 1/1]
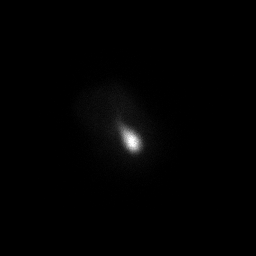

[he hepatobiliary · 3.43mm/px · 6 of 60 frames shown (2 of 3)]
[frame 6/60]
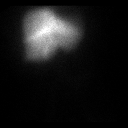
[frame 16/60]
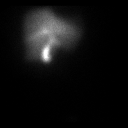
[frame 26/60]
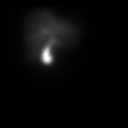
[frame 36/60]
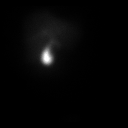
[frame 46/60]
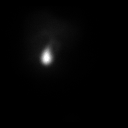
[frame 56/60]
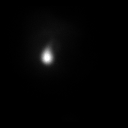

[he hepatobiliary · 3.43mm/px · 6 of 60 frames shown (3 of 3)]
[frame 6/60]
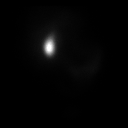
[frame 16/60]
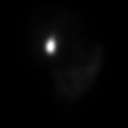
[frame 26/60]
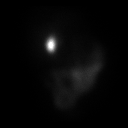
[frame 36/60]
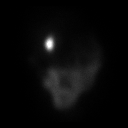
[frame 46/60]
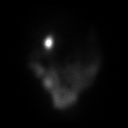
[frame 56/60]
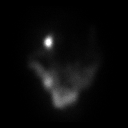

[13 of 13 positions shown; findings below may reference images not displayed]

FINDINGS: Prompt uptake and biliary excretion of activity by the liver is
seen. Gallbladder activity is visualized, consistent with patency of
cystic duct. Biliary activity passes into small bowel, consistent
with patent common bile duct.

Calculated gallbladder ejection fraction is 77%. (Normal gallbladder
ejection fraction with Ensure is greater than 33%.)
IMPRESSION: Normal examination.

## 2017-08-06 ENCOUNTER — Ambulatory Visit
Admission: RE | Admit: 2017-08-06 | Discharge: 2017-08-06 | Disposition: A | Discharge status: Home / Self Care | Payer: 59 | Source: Ambulatory Visit | Attending: Internal Medicine | Admitting: Internal Medicine

## 2017-08-06 DIAGNOSIS — Z1231 Encounter for screening mammogram for malignant neoplasm of breast: Secondary | ICD-10-CM

## 2017-10-05 ENCOUNTER — Ambulatory Visit (INDEPENDENT_AMBULATORY_CARE_PROVIDER_SITE_OTHER): Payer: 59

## 2017-10-05 ENCOUNTER — Encounter (INDEPENDENT_AMBULATORY_CARE_PROVIDER_SITE_OTHER): Payer: Self-pay | Admitting: Orthopedic Surgery

## 2017-10-05 ENCOUNTER — Ambulatory Visit (INDEPENDENT_AMBULATORY_CARE_PROVIDER_SITE_OTHER): Payer: 59 | Admitting: Physician Assistant

## 2017-10-05 VITALS — Ht 67.0 in | Wt 270.0 lb

## 2017-10-05 DIAGNOSIS — G8929 Other chronic pain: Secondary | ICD-10-CM

## 2017-10-05 DIAGNOSIS — M5441 Lumbago with sciatica, right side: Secondary | ICD-10-CM

## 2017-10-05 DIAGNOSIS — M5442 Lumbago with sciatica, left side: Secondary | ICD-10-CM | POA: Diagnosis not present

## 2017-10-05 DIAGNOSIS — M5440 Lumbago with sciatica, unspecified side: Secondary | ICD-10-CM

## 2017-10-05 MED ORDER — PREDNISONE 10 MG PO TABS: 20.0000 mg | ORAL_TABLET | Freq: Every day | ORAL | 0 refills | Status: DC

## 2017-10-05 MED FILL — predniSONE 10 MG TABS: 10 | 30 days supply | Qty: 60 | Fill #0

## 2017-10-05 NOTE — Progress Notes (Signed)
Office Visit Note   Patient: Marissa Cox           Date of Birth: [birth year]           MRN: [id number] Visit Date: 10/05/2017              Requested by: Lottie Dawson, MD [address] [place], [place] [address] PCP: Lottie Dawson, MD  Chief Complaint  Patient presents with  . Lower Back - Pain      HPI: Patient is a 57 year old female who presents with complaints of low back pain which began several months ago.  She reports that initially it was just pain over the center of her lower back.  But then she started to develop left-sided hip area and thigh area numbness and tingling.  She reports that the pain woke her up at night due to the weird sensory sensations over the left hip.  She reports that it is also worse at the end of the day when she stops moving and tries to sit down.  She does not know that it is worse when she stands still or sits.  She has been trying some heat to the area.  She is been trying some Tylenol and Motrin but this is not relieved her discomfort.  She reports she is particularly disturbed by the tingling and weird sensations over the hip and left thigh area.  Assessment & Plan: Visit Diagnoses:  1. Chronic left-sided low back pain with bilateral sciatica     Plan: Going to start a trial of prednisone 20 mg daily with breakfast.  She will take this to alleviate her symptoms and if her symptoms improved significantly, she can decrease the prednisone to 10 mg daily for a week and then stop the prednisone.  She will follow-up here in 3 weeks for recheck or sooner should she have difficulties in the interim.  If her symptoms persist or not alleviated by the prednisone she may need MRI scanning.  Follow-Up Instructions: No follow-ups on file.   Ortho Exam  Patient is alert, oriented, no adenopathy, well-dressed, normal affect, normal respiratory effort. Patient is a well-nourished well-developed centrally obese female  in no distress.  She has good strength throughout bilateral lower extremities.  Weakly positive straight leg raise on the left.  She has tenderness over the left paraspinous muscles and centrally over the lower lumbar spine around L4-L5.  Imaging: No results found. No images are attached to the encounter.  Labs: Lab Results  Component Value Date   REPTSTATUS 03/24/2013 FINAL 03/22/2013   CULT  03/22/2013    No Beta Hemolytic Streptococci Isolated Performed at [facility]     Lab Results  Component Value Date   ALBUMIN 3.6 04/14/2016   ALBUMIN 3.9 05/05/2012   ALBUMIN 3.3 (L) 12/23/2011    Body mass index is 42.29 kg/m.  Orders:  Orders Placed This Encounter  Procedures  . XR Lumbar Spine 2-3 Views   No orders of the defined types were placed in this encounter.    Procedures: No procedures performed  Clinical Data: No additional findings.  ROS:  All other systems negative, except as noted in the HPI. Review of Systems  Constitutional: Negative.   HENT: Negative.   Eyes: Negative.   Respiratory: Negative.   Musculoskeletal: Positive for arthralgias and back pain.  Neurological: Positive for numbness.       Of left thigh anteriorly    Objective: Vital  Signs: Ht 5\' 7"  (1.702 m)   Wt 270 lb (122.5 kg)   BMI 42.29 kg/m   Specialty Comments:  No specialty comments available.  PMFS History: There are no active problems to display for this patient.  Past Medical History:  Diagnosis Date  . Hyperlipidemia   . Hypertension   . Morbid obesity ([place])     Family History  Problem Relation Age of Onset  . Cancer Mother        bladder  . Cancer Maternal Aunt        breast  . Breast cancer Maternal Aunt   . Cancer Paternal Aunt        breast  . Breast cancer Paternal Aunt   . Cancer Paternal Aunt        breast  . Breast cancer Paternal Aunt   . Cancer Paternal Aunt        breast  . Cancer Maternal Aunt        ovarian    Past Surgical  History:  Procedure Laterality Date  . LAPAROSCOPIC GASTRIC SLEEVE RESECTION  12/22/2011   Procedure: LAPAROSCOPIC GASTRIC SLEEVE RESECTION;  Surgeon: Madilyn Hook, DO;  Location: [facility];  Service: General;  Laterality: N/A;  . MULTIPLE TOOTH EXTRACTIONS  1981  . TUBAL LIGATION  1990  . UPPER GI ENDOSCOPY  12/22/2011   Procedure: UPPER GI ENDOSCOPY;  Surgeon: Madilyn Hook, DO;  Location: [facility];  Service: General;;   Social History   Occupational History  . Not on file  Tobacco Use  . Smoking status: Current Every Day Smoker    Packs/day: 0.50  . Smokeless tobacco: Former Systems developer    Quit date: 08/07/2011  Substance and Sexual Activity  . Alcohol use: No  . Drug use: No  . Sexual activity: Not on file

## 2017-10-06 ENCOUNTER — Encounter (INDEPENDENT_AMBULATORY_CARE_PROVIDER_SITE_OTHER): Payer: Self-pay | Admitting: Physician Assistant

## 2017-10-06 MED FILL — HYDROCHLOROTHIAZIDE 25 MG T: 25 | 90 days supply | Qty: 90 | Fill #2

## 2017-10-06 MED FILL — LISINOPRIL 10 MG TABLET: 10 | 90 days supply | Qty: 90 | Fill #2

## 2017-10-13 DIAGNOSIS — Z716 Tobacco abuse counseling: Secondary | ICD-10-CM | POA: Diagnosis not present

## 2017-10-13 DIAGNOSIS — F419 Anxiety disorder, unspecified: Secondary | ICD-10-CM | POA: Diagnosis not present

## 2017-10-13 DIAGNOSIS — J441 Chronic obstructive pulmonary disease with (acute) exacerbation: Secondary | ICD-10-CM | POA: Diagnosis not present

## 2017-10-13 MED FILL — buPROPion HCL ER (XL) 150 M: 150 | 30 days supply | Qty: 30 | Fill #0

## 2017-10-13 MED FILL — VENTOLIN HFA 90 MCG INHALER: 108 (90 BAS | 25 days supply | Qty: 18 | Fill #0

## 2017-10-19 MED FILL — predniSONE 10 MG TABS: 10 | 6 days supply | Qty: 21 | Fill #0

## 2017-10-26 ENCOUNTER — Ambulatory Visit (INDEPENDENT_AMBULATORY_CARE_PROVIDER_SITE_OTHER): Payer: 59 | Admitting: Orthopedic Surgery

## 2017-11-01 ENCOUNTER — Ambulatory Visit (INDEPENDENT_AMBULATORY_CARE_PROVIDER_SITE_OTHER): Payer: 59 | Admitting: Orthopedic Surgery

## 2017-11-01 DIAGNOSIS — F17210 Nicotine dependence, cigarettes, uncomplicated: Secondary | ICD-10-CM | POA: Diagnosis not present

## 2017-11-16 MED FILL — buPROPion HCL ER (XL) 150 M: 150 | 30 days supply | Qty: 30 | Fill #1

## 2017-11-19 DIAGNOSIS — Z1211 Encounter for screening for malignant neoplasm of colon: Secondary | ICD-10-CM | POA: Diagnosis not present

## 2017-11-19 DIAGNOSIS — Z0000 Encounter for general adult medical examination without abnormal findings: Secondary | ICD-10-CM | POA: Diagnosis not present

## 2017-11-19 DIAGNOSIS — Z1239 Encounter for other screening for malignant neoplasm of breast: Secondary | ICD-10-CM | POA: Diagnosis not present

## 2017-11-19 DIAGNOSIS — F17210 Nicotine dependence, cigarettes, uncomplicated: Secondary | ICD-10-CM | POA: Diagnosis not present

## 2017-11-19 DIAGNOSIS — F419 Anxiety disorder, unspecified: Secondary | ICD-10-CM | POA: Diagnosis not present

## 2017-11-19 DIAGNOSIS — J449 Chronic obstructive pulmonary disease, unspecified: Secondary | ICD-10-CM | POA: Diagnosis not present

## 2017-11-19 DIAGNOSIS — I10 Essential (primary) hypertension: Secondary | ICD-10-CM | POA: Diagnosis not present

## 2017-11-19 DIAGNOSIS — E785 Hyperlipidemia, unspecified: Secondary | ICD-10-CM | POA: Diagnosis not present

## 2017-11-19 MED FILL — BREO ELLIPTA 200-25 MCG INH: 200-25 | 30 days supply | Qty: 60 | Fill #0

## 2017-11-22 MED FILL — ATORVASTATIN CALCIUM 20 MG: 20 | 30 days supply | Qty: 30 | Fill #0

## 2017-11-24 ENCOUNTER — Encounter: Payer: Self-pay | Admitting: Registered"

## 2017-11-24 ENCOUNTER — Encounter: Payer: 59 | Attending: Internal Medicine | Admitting: Registered"

## 2017-11-24 DIAGNOSIS — Z713 Dietary counseling and surveillance: Secondary | ICD-10-CM

## 2017-11-24 DIAGNOSIS — Z9884 Bariatric surgery status: Secondary | ICD-10-CM | POA: Diagnosis not present

## 2017-11-24 NOTE — Patient Instructions (Signed)
-   Make lunch for work the night before. Reduce eating out during the work week.   - Increase fiber intake with whole grains, fruits, and vegetables.

## 2017-11-24 NOTE — Progress Notes (Signed)
Medical Nutrition Therapy:  Appt start time: 3:45 end time:  4:45.   Assessment:  Primary concerns today: Pt states she is in early stages of COPD; reports she has always been a smoker and in the process of quitting using patches or chewing gum. Pt states she has reduced smoking from 1 packs per day to 2-3 cigarettes a day; smoking since age [age].   Pt expectations: Pt states she needs help with high cholesterol and diet. Pt reports are goals are to quit smoking and reduce medications. Pt reports elevated cholesterol but unsure of values.  Pt states she had bariatric surgery in 05-22-2010; gastric sleeve. Pt states she lost 50 lbs with bariatric surgery. Pt states she has always been heavy throughout her entire life. Pt states she has experienced a lot of loss within recent years May 21, 2005 mom died, 21-May-2008 son killed, May 21, 2012 step-dad died, 05/21/2016 husband died. Pt states she has had increased stress in this season due to holidays, husband passed away last 22-Jan-2023, and son's birthday approaching. Pt states she has 2 other sons still living. Pt states she and husband were married fior 30 years and it has been a challenge adjusting to that. Pt states her niece and her 2 children are living with her temporarily.  Pt states she works 7 am-3:30 pm. Pt states she does not like oatmeal, grits, cream of wheat, or cereal. Pt states she only eats bread when used as sandwich or bun. Pt states she keeps water bottle on desk as reminder to drink water throughout day. 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    Preferred Learning Style:   No preference indicated   Learning Readiness:   Ready  Change in progress   MEDICATIONS: See list   DIETARY INTAKE:  Usual eating pattern includes 3 meals and 1 snacks per day.  Everyday foods include fruit, eggs, bacon, sandwiches, chicken, vegetables, potatoes.  Avoided foods include oatmeal, grits,  cream of wheat, and cereal.    24-hr recall:  B ( AM): boiled egg + 2 slices of bacon + potatoes + coffee or bagel + cream cheese Snk ( AM): fruit  L ( PM): food trucks at work or [employer]- ham and cheese sandwich + pickle + 1/2 bag of Doritos Snk ( PM): none D ( PM): baked chicken + broccoli + carrots + creamed potatoes + rolls Snk ( PM): popcorn or pretzels + hummus (sometimes) Beverages: coffee, coke, water, sweet tea, hot tea   Usual physical activity: walking 8000 steps/day  Estimated energy needs: 1600 calories 180 g carbohydrates 120 g protein 44 g fat  Progress Towards Goal(s):  In progress.   Nutritional Diagnosis:  NB-1.1 Food and nutrition-related knowledge deficit As related to lack of prior nutrition-related information.  As evidenced by pt verbalizes incomplete information .    Intervention:  Nutrition education and counseling. Pt was educated and counseled on cholesterol intake, stress management, and importance of physical activity. Pt was in agreement with goals listed.  Goals: - Make lunch for work the night before. Reduce eating out during the work week.  - Increase fiber intake with whole grains, fruits, and vegetables.   Teaching Method Utilized:  Visual Auditory Hands on  Handouts given during visit include:  Cholesterol-Lowering Nutrition Therapy  Barriers to learning/adherence to lifestyle change: work-life balance  Demonstrated degree of understanding via:  Teach Back   Monitoring/Evaluation:  Dietary intake,  exercise, and body weight in 1 month(s).

## 2017-12-22 MED FILL — ATORVASTATIN CALCIUM 20 MG: 20 | 30 days supply | Qty: 30 | Fill #1

## 2017-12-22 MED FILL — buPROPion HCL ER (XL) 150 M: 150 | 30 days supply | Qty: 30 | Fill #2

## 2017-12-22 MED FILL — BREO ELLIPTA 200-25 MCG INH: 200-25 | 30 days supply | Qty: 60 | Fill #1

## 2017-12-27 MED FILL — LISINOPRIL 10 MG TABS: 10 | 90 days supply | Qty: 90 | Fill #0

## 2017-12-27 MED FILL — SM BLOOD PRESSURE MONITOR: 1 days supply | Qty: 1 | Fill #0

## 2017-12-27 MED FILL — HYDROCHLOROTHIAZIDE 25 MG T: 25 | 90 days supply | Qty: 90 | Fill #0

## 2017-12-28 ENCOUNTER — Encounter: Payer: Self-pay | Admitting: Registered"

## 2017-12-28 ENCOUNTER — Encounter: Payer: 59 | Attending: Internal Medicine | Admitting: Registered"

## 2017-12-28 DIAGNOSIS — Z9884 Bariatric surgery status: Secondary | ICD-10-CM | POA: Diagnosis not present

## 2017-12-28 DIAGNOSIS — E785 Hyperlipidemia, unspecified: Secondary | ICD-10-CM

## 2017-12-28 NOTE — Patient Instructions (Addendum)
-   Call to find out cholesterol and lipid values.   - Try to get back to 2-3 cigarettes a day. Chew gum as replacement.

## 2017-12-28 NOTE — Progress Notes (Signed)
  Medical Nutrition Therapy:  Appt start time: 3:45 end time:  4:16.   Assessment:  Primary concerns today: Pt states she is in early stages of COPD; reports she has always been a smoker and in the process of quitting using patches or chewing gum.  Pt expectations: Pt states she needs help with high cholesterol and diet. Pt reports are goals are to quit smoking and reduce medications. Pt reports elevated cholesterol but unsure of values.  Pt states she had reduced smoking from 1 packs per day to 2-3 cigarettes a day, but has increased lately due to stress with holidays. Pt states this past Thanksgiving was good even though she wanted to have the blues due to family loss. Pt states is on an emotional roller coaster. Pt states she feels like she has 2 bricks on her shoulder due to holidays and just wants to get through Christmas.    Pt reports previous history of bariatric surgery (2012)-gastric sleeve.   Pt states she works 7 am-3:30 pm. Pt states she does not like oatmeal, grits, cream of wheat, or cereal. Pt states she only eats bread when used as sandwich or bun. Pt states she keeps water bottle on desk as reminder to drink water throughout day. 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    Preferred Learning Style:   No preference indicated   Learning Readiness:   Ready  Change in progress   MEDICATIONS: See list   DIETARY INTAKE:  Usual eating pattern includes 3 meals and 1 snacks per day.  Everyday foods include fruit, eggs, bacon, sandwiches, chicken, vegetables, potatoes.  Avoided foods include oatmeal, grits, cream of wheat, and cereal.    24-hr recall:  B ( AM): boiled egg + 2 slices of bacon + potatoes + coffee or bagel + cream cheese Snk ( AM): fruit  L ( PM): food trucks at work or [employer]- ham and cheese sandwich + pickle + 1/2 bag of Doritos Snk ( PM): none D ( PM): baked chicken  + broccoli + carrots + creamed potatoes + rolls Snk ( PM): popcorn or pretzels + hummus (sometimes) Beverages: coffee, coke, water, sweet tea, hot tea   Usual physical activity: walking 8000 steps/day  Estimated energy needs: 1600 calories 180 g carbohydrates 120 g protein 44 g fat  Progress Towards Goal(s):  In progress.   Nutritional Diagnosis:  NB-1.1 Food and nutrition-related knowledge deficit As related to lack of prior nutrition-related information.  As evidenced by pt verbalizes incomplete information .    Intervention:  Nutrition education and counseling. Pt was educated and counseled on cholesterol intake, stress management, and importance of physical activity. Pt was in agreement with goals listed.  Goals: - Call to find out cholesterol and lipid values.  - Try to get back to 2-3 cigarettes a day. Chew gum as replacement.   Teaching Method Utilized:  Visual Auditory Hands on  Handouts given during visit include:  none  Barriers to learning/adherence to lifestyle change: work-life balance  Demonstrated degree of understanding via:  Teach Back   Monitoring/Evaluation:  Dietary intake, exercise, and body weight in 1 month(s).

## 2018-01-25 MED FILL — ATORVASTATIN CALCIUM 20 MG: 20 | 30 days supply | Qty: 30 | Fill #2

## 2018-01-27 ENCOUNTER — Encounter: Payer: Self-pay | Admitting: Registered"

## 2018-01-27 ENCOUNTER — Encounter: Payer: 59 | Attending: Internal Medicine | Admitting: Registered"

## 2018-01-27 DIAGNOSIS — E785 Hyperlipidemia, unspecified: Secondary | ICD-10-CM

## 2018-01-27 DIAGNOSIS — Z9884 Bariatric surgery status: Secondary | ICD-10-CM | POA: Insufficient documentation

## 2018-01-27 NOTE — Progress Notes (Signed)
  Medical Nutrition Therapy:  Appt start time: 3:50 end time:  4:20.   Assessment:  Primary concerns today: Pt states she is in early stages of COPD; reports she has always been a smoker and in the process of quitting using patches or chewing gum.  Pt expectations: Pt states she needs help with high cholesterol and diet. Pt reports are goals are to quit smoking and reduce medications. Pt reports elevated cholesterol; recent lipid panel cholesterol (247) and LDLc (158).   Pt states she has decreased smoking frequency during recent holidays from 6-10 cigarettes/day to 3-4 cigarettes/day. Pt states stress level is decreasing. Pt reports wanting help with breakfast options and wants to keep a food log. Pt reports eating 3 meals a day.   Pt reports previous history of bariatric surgery (2012)-gastric sleeve.   Pt states she works 7 am-3:30 pm. Pt states she does not like oatmeal, grits, cream of wheat, or cereal. Pt states she only eats bread when used as sandwich or bun. Pt states she keeps water bottle on desk as reminder to drink water throughout day. 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    Preferred Learning Style:   No preference indicated   Learning Readiness:   Ready  Change in progress   MEDICATIONS: See list   DIETARY INTAKE:  Usual eating pattern includes 3 meals and 1 snacks per day.  Everyday foods include fruit, eggs, bacon, sandwiches, chicken, vegetables, potatoes.  Avoided foods include oatmeal, grits, cream of wheat, and cereal.    24-hr recall:  B ( AM): 2 eggs + 2 slices of bacon + bread Snk ( AM): sometimes fruit  L ( PM): italian sausage + bun or food trucks at work or [employer] and cheese sandwich + pickle + 1/2 bag of Doritos Snk ( PM): none D ( PM): pinto beans + corn bread or baked chicken + broccoli + carrots + creamed potatoes + rolls Snk ( PM): popcorn or  pretzels + hummus (sometimes) Beverages: coffee, coke, water, sweet tea, hot tea   Usual physical activity: walking 8000 steps/day  Estimated energy needs: 1600 calories 180 g carbohydrates 120 g protein 44 g fat  Progress Towards Goal(s):  In progress.   Nutritional Diagnosis:  NB-1.1 Food and nutrition-related knowledge deficit As related to lack of prior nutrition-related information.  As evidenced by pt verbalizes incomplete information .    Intervention:  Nutrition education and counseling. Pt was educated and counseled on cholesterol intake, stress management, and importance of physical activity. Pt was in agreement with goals listed.  Goals: - Breakfast options: cottage cheese + fruit or grits + eggs or oatmeal + bacon/sausage or peanut butter toast.  - Continue to have balanced breakfast with carbohydrates + protein.  - Include vegetables with lunch and dinner.   - Great job with having 3 meals a day!   Teaching Method Utilized:  Visual Auditory Hands on  Handouts given during visit include:  none  Barriers to learning/adherence to lifestyle change: work-life balance  Demonstrated degree of understanding via:  Teach Back   Monitoring/Evaluation:  Dietary intake, exercise, and body weight in 1 month(s).

## 2018-01-27 NOTE — Patient Instructions (Signed)
-   Breakfast options: cottage cheese + fruit or grits + eggs or oatmeal + bacon/sausage or peanut butter toast.   - Continue to have balanced breakfast with carbohydrates + protein.   - Include vegetables with lunch and dinner.    - Great job with having 3 meals a day!

## 2018-02-25 MED FILL — BREO ELLIPTA 200-25 MCG INH: 200-25 | 30 days supply | Qty: 60 | Fill #2

## 2018-02-28 MED FILL — ATORVASTATIN CALCIUM 20 MG: 20 | 30 days supply | Qty: 30 | Fill #0

## 2018-03-03 ENCOUNTER — Ambulatory Visit: Payer: Self-pay | Admitting: Registered"

## 2018-03-28 DIAGNOSIS — T466X5A Adverse effect of antihyperlipidemic and antiarteriosclerotic drugs, initial encounter: Secondary | ICD-10-CM | POA: Diagnosis not present

## 2018-03-28 DIAGNOSIS — M791 Myalgia, unspecified site: Secondary | ICD-10-CM | POA: Diagnosis not present

## 2018-03-31 ENCOUNTER — Encounter: Payer: 59 | Attending: Internal Medicine | Admitting: Registered"

## 2018-03-31 ENCOUNTER — Encounter: Payer: Self-pay | Admitting: Registered"

## 2018-03-31 DIAGNOSIS — Z9884 Bariatric surgery status: Secondary | ICD-10-CM | POA: Diagnosis not present

## 2018-03-31 DIAGNOSIS — E785 Hyperlipidemia, unspecified: Secondary | ICD-10-CM

## 2018-03-31 NOTE — Patient Instructions (Addendum)
-   Try to have fruit with protein shake on mornings when unable to have breakfast at home.   - Continue trying to have vegetables with lunch and dinner.   - Keep up the great work!

## 2018-03-31 NOTE — Progress Notes (Signed)
  Medical Nutrition Therapy:  Appt start time: 3:48 end time: 4:20   Assessment:  Primary concerns today: Pt states she is in early stages of COPD; reports she has always been a smoker and in the process of quitting using patches or chewing gum.  Pt expectations: Pt states she needs help with high cholesterol and diet. Pt reports are goals are to quit smoking and reduce medications. Pt reports elevated cholesterol; recent lipid panel cholesterol (247) and LDLc (158).   Pt states she has started taking lipitor recently; joints hurting more than usual. Pt states she has been taking lunch to work almost everyday. Reports currently enrolled in 10 week heart-healthy cooking class at church; loves it. Pt states she has been using more olive oil and coconut oil. Pt states she is participating in high blood pressure program with [facility]. Pt reports walking more and waking up earlier to exercise in the morning. Pt reports feeling like she may be getting her mojo back.   Pt states she works 7 am-3:30 pm. Pt states she does not like oatmeal, grits, cream of wheat, or cereal. Pt states she keeps water bottle on desk as reminder to drink water throughout day.    Preferred Learning Style:   No preference indicated   Learning Readiness:   Ready  Change in progress   MEDICATIONS: See list   DIETARY INTAKE:  Usual eating pattern includes 3 meals and 1 snacks per day.  Everyday foods include fruit, eggs, bacon, sandwiches, chicken, vegetables, potatoes.  Avoided foods include oatmeal, grits, cream of wheat, and cereal.    24-hr recall:  B ( AM): protein shake or 2 eggs + 2 slices of bacon + bread Snk ( AM): sometimes fruit  L ( PM): jumbalaya bowl (brown rice, chicken, shrimp, vegetables) or Lean Cuisine (spaghetti + meat sauce) + yogurt + fruit + carrots/tomatoes Snk ( PM): none D ( PM): 2 eggs + 2 slices of bacon + 2 slices of toast  Snk ( PM): popcorn  Beverages: coffee, coke, water,  sweet tea, hot tea   Usual physical activity: walking 8000 steps/day  Estimated energy needs: 1600 calories 180 g carbohydrates 120 g protein 44 g fat  Progress Towards Goal(s):  In progress.   Nutritional Diagnosis:  NB-1.1 Food and nutrition-related knowledge deficit As related to lack of prior nutrition-related information.  As evidenced by pt verbalizes incomplete information .    Intervention:  Nutrition education and counseling. Pt was encouraged to keep up the great work on positive changes made towards improving heart health. Pt was also educated and counseled on how to have balanced meals throughout the day and the benefits of having vegetables. Pt was in agreement with goals listed.  Goals: - Try to have fruit with protein shake on mornings when unable to have breakfast at home.  - Continue trying to have vegetables with lunch and dinner.  - Keep up the great work!  Teaching Method Utilized:  Visual Auditory Hands on  Handouts given during visit include:  none  Barriers to learning/adherence to lifestyle change: work-life balance  Demonstrated degree of understanding via:  Teach Back   Monitoring/Evaluation:  Dietary intake, exercise, and body weight in 1 month(s).

## 2018-04-04 MED FILL — ATORVASTATIN 20 MG TABLET: 20 | 30 days supply | Qty: 30 | Fill #1

## 2018-04-04 MED FILL — BREO ELLIPTA 200-25 MCG INH: 200-25 | 30 days supply | Qty: 60 | Fill #3

## 2018-04-06 DIAGNOSIS — R509 Fever, unspecified: Secondary | ICD-10-CM | POA: Diagnosis not present

## 2018-04-06 DIAGNOSIS — J45901 Unspecified asthma with (acute) exacerbation: Secondary | ICD-10-CM | POA: Diagnosis not present

## 2018-04-06 MED FILL — ALBUTEROL 0.083% INHAL SOLN: (2.5 MG/3ML | 8 days supply | Qty: 150 | Fill #0

## 2018-04-06 MED FILL — AZITHROMYCIN 500 MG TABLET: 500 | 5 days supply | Qty: 5 | Fill #0

## 2018-04-06 MED FILL — CEFUROXIME AXETIL 250 MG TA: 250 | 7 days supply | Qty: 14 | Fill #0

## 2018-04-07 ENCOUNTER — Other Ambulatory Visit: Payer: Self-pay

## 2018-04-07 ENCOUNTER — Ambulatory Visit (HOSPITAL_COMMUNITY)
Admission: RE | Admit: 2018-04-07 | Discharge: 2018-04-07 | Disposition: A | Discharge status: Home / Self Care | Payer: 59 | Source: Ambulatory Visit | Attending: Internal Medicine | Admitting: Internal Medicine

## 2018-04-07 ENCOUNTER — Other Ambulatory Visit (HOSPITAL_COMMUNITY): Payer: Self-pay | Admitting: Internal Medicine

## 2018-04-07 DIAGNOSIS — R05 Cough: Secondary | ICD-10-CM | POA: Diagnosis not present

## 2018-04-07 DIAGNOSIS — R509 Fever, unspecified: Secondary | ICD-10-CM

## 2018-04-13 DIAGNOSIS — E785 Hyperlipidemia, unspecified: Secondary | ICD-10-CM | POA: Diagnosis not present

## 2018-04-13 DIAGNOSIS — J40 Bronchitis, not specified as acute or chronic: Secondary | ICD-10-CM | POA: Diagnosis not present

## 2018-04-13 DIAGNOSIS — J4520 Mild intermittent asthma, uncomplicated: Secondary | ICD-10-CM | POA: Diagnosis not present

## 2018-04-13 MED FILL — BENZONATATE 100 MG CAPS: 100 | 10 days supply | Qty: 30 | Fill #0

## 2018-04-21 DIAGNOSIS — J0121 Acute recurrent ethmoidal sinusitis: Secondary | ICD-10-CM | POA: Diagnosis not present

## 2018-04-21 DIAGNOSIS — H66001 Acute suppurative otitis media without spontaneous rupture of ear drum, right ear: Secondary | ICD-10-CM | POA: Diagnosis not present

## 2018-04-21 DIAGNOSIS — I10 Essential (primary) hypertension: Secondary | ICD-10-CM | POA: Diagnosis not present

## 2018-05-09 MED FILL — HYDROCHLOROTHIAZIDE 25 MG T: 25 | 90 days supply | Qty: 90 | Fill #0

## 2018-05-09 MED FILL — LISINOPRIL 10 MG TABLET: 10 | 90 days supply | Qty: 90 | Fill #0

## 2018-05-20 MED FILL — BREO ELLIPTA 200-25 MCG INH: 200-25 | 30 days supply | Qty: 60 | Fill #0

## 2018-05-20 MED FILL — ATORVASTATIN 20 MG TABLET: 20 | 90 days supply | Qty: 90 | Fill #0

## 2018-07-14 ENCOUNTER — Other Ambulatory Visit: Payer: Self-pay | Admitting: Internal Medicine

## 2018-07-14 DIAGNOSIS — Z1231 Encounter for screening mammogram for malignant neoplasm of breast: Secondary | ICD-10-CM

## 2018-07-20 DIAGNOSIS — J449 Chronic obstructive pulmonary disease, unspecified: Secondary | ICD-10-CM | POA: Diagnosis not present

## 2018-07-20 DIAGNOSIS — J4520 Mild intermittent asthma, uncomplicated: Secondary | ICD-10-CM | POA: Diagnosis not present

## 2018-07-20 DIAGNOSIS — E785 Hyperlipidemia, unspecified: Secondary | ICD-10-CM | POA: Diagnosis not present

## 2018-07-20 DIAGNOSIS — I10 Essential (primary) hypertension: Secondary | ICD-10-CM | POA: Diagnosis not present

## 2018-07-20 DIAGNOSIS — F4322 Adjustment disorder with anxiety: Secondary | ICD-10-CM | POA: Diagnosis not present

## 2018-07-20 DIAGNOSIS — F17210 Nicotine dependence, cigarettes, uncomplicated: Secondary | ICD-10-CM | POA: Diagnosis not present

## 2018-07-20 MED FILL — HYDROCHLOROTHIAZIDE 25 MG T: 25 | 90 days supply | Qty: 90 | Fill #0

## 2018-07-20 MED FILL — buPROPion HCL ER (XL) 150 M: 150 | 30 days supply | Qty: 30 | Fill #0

## 2018-07-20 MED FILL — LISINOPRIL 10 MG TABLET: 10 | 90 days supply | Qty: 90 | Fill #0

## 2018-07-20 MED FILL — BREO ELLIPTA 200-25 MCG INH: 200-25 | 30 days supply | Qty: 60 | Fill #0

## 2018-07-20 MED FILL — ALBUTEROL SULFATE HFA 108 (: 108 (90 BAS | 25 days supply | Qty: 18 | Fill #0

## 2018-08-25 ENCOUNTER — Ambulatory Visit
Admission: RE | Admit: 2018-08-25 | Discharge: 2018-08-25 | Disposition: A | Discharge status: Home / Self Care | Payer: 59 | Source: Ambulatory Visit | Attending: Internal Medicine | Admitting: Internal Medicine

## 2018-08-25 ENCOUNTER — Other Ambulatory Visit: Payer: Self-pay

## 2018-08-25 DIAGNOSIS — Z1231 Encounter for screening mammogram for malignant neoplasm of breast: Secondary | ICD-10-CM

## 2018-10-21 DIAGNOSIS — F17210 Nicotine dependence, cigarettes, uncomplicated: Secondary | ICD-10-CM | POA: Diagnosis not present

## 2018-10-21 DIAGNOSIS — J449 Chronic obstructive pulmonary disease, unspecified: Secondary | ICD-10-CM | POA: Diagnosis not present

## 2018-10-21 DIAGNOSIS — J019 Acute sinusitis, unspecified: Secondary | ICD-10-CM | POA: Diagnosis not present

## 2018-10-21 DIAGNOSIS — H6983 Other specified disorders of Eustachian tube, bilateral: Secondary | ICD-10-CM | POA: Diagnosis not present

## 2018-10-21 MED FILL — BREO ELLIPTA 200-25 MCG INH: 200-25 | 30 days supply | Qty: 60 | Fill #0

## 2018-10-21 MED FILL — MUCINEX ER 600 MG TABLET: 600 | 7 days supply | Qty: 14 | Fill #0

## 2018-10-21 MED FILL — ALBUTEROL SULFATE HFA 108 (: 108 (90 BAS | 25 days supply | Qty: 18 | Fill #0

## 2018-10-27 MED FILL — HYDROCHLOROTHIAZIDE 25 MG T: 25 | 90 days supply | Qty: 90 | Fill #0

## 2018-10-27 MED FILL — LISINOPRIL 10 MG TABS: 10 | 90 days supply | Qty: 90 | Fill #0

## 2018-11-05 DIAGNOSIS — H5203 Hypermetropia, bilateral: Secondary | ICD-10-CM | POA: Diagnosis not present

## 2018-11-18 MED FILL — LISINOPRIL 10 MG TABS: 10 | 90 days supply | Qty: 90 | Fill #0

## 2018-11-18 MED FILL — ATORVASTATIN 20 MG TABLET: 20 | 90 days supply | Qty: 90 | Fill #0

## 2018-11-18 MED FILL — HYDROCHLOROTHIAZIDE 25 MG T: 25 | 90 days supply | Qty: 90 | Fill #0

## 2018-11-25 ENCOUNTER — Other Ambulatory Visit: Payer: Self-pay

## 2018-11-25 ENCOUNTER — Ambulatory Visit: Payer: 59 | Admitting: Podiatry

## 2018-11-25 ENCOUNTER — Ambulatory Visit (INDEPENDENT_AMBULATORY_CARE_PROVIDER_SITE_OTHER): Payer: 59

## 2018-11-25 DIAGNOSIS — M7732 Calcaneal spur, left foot: Secondary | ICD-10-CM

## 2018-11-25 DIAGNOSIS — M79672 Pain in left foot: Secondary | ICD-10-CM

## 2018-11-25 DIAGNOSIS — M722 Plantar fascial fibromatosis: Secondary | ICD-10-CM

## 2018-11-25 MED ORDER — MELOXICAM 15 MG PO TABS: 15.0000 mg | ORAL_TABLET | Freq: Every day | ORAL | 0 refills | Status: DC

## 2018-11-25 MED FILL — MELOXICAM 15 MG TABLET: 15 | 30 days supply | Qty: 30 | Fill #0

## 2018-11-25 NOTE — Patient Instructions (Signed)

## 2018-11-25 NOTE — Progress Notes (Signed)
Subjective: 58 year old female presents the office today for concerns of left heel pain.  She states that it feels like that she has what she had previously but she is been compensating that she can see pain 0 painful.  She denies any recent injury or falls.  No swelling to be healing with no radiating pain or weakness.  She gets pain in the morning when she first gets up after being on her feet all day. Denies any systemic complaints such as fevers, chills, nausea, vomiting. No acute changes since last appointment, and no other complaints at this time.   Objective: AAO x3, NAD DP/PT pulses palpable bilaterally, CRT less than 3 second There is tenderness to palpation along the plantar medial tubercle of the calcaneus at the insertion of plantar fascia on the left foot. There is no pain along the course of the plantar fascia within the arch of the foot. Plantar fascia appears to be intact. There is no pain with lateral compression of the calcaneus or pain with vibratory sensation. There is no pain along the course or insertion of the achilles tendon.  Subjective there is no discomfort of the anterior aspect of the ankle but no specific area pinpoint tenderness.  No edema to the ankle there is no pain or restriction with ankle range of motion.  No other areas of tenderness to bilateral lower extremities. Negative tinel sign.  No pain with calf compression, swelling, warmth, erythema  Assessment: Left heel pain, plantar fasciitis; ankle pain  Plan: -All treatment options discussed with the patient including all alternatives, risks, complications.  -X-rays obtained.  Calcaneal spurring present.  No evidence of acute fracture. -Steroid injection performed.  See procedure note below.  Plantar fascial brace dispensed.  Discussed stretching, icing daily.  Prescription for medications and orthotics.  She previously had orthotics but she states they are not comfortable.  She will bring them to her next  appointment for evaluation. -Prescribed mobic. Discussed side effects of the medication and directed to stop if any are to occur and call the office.  -Patient encouraged to call the office with any questions, concerns, change in symptoms.   Procedure: Injection Tendon/Ligament Discussed alternatives, risks, complications and verbal consent was obtained.  Location: LEFT plantar fascia at the glabrous junction; medial approach. Skin Prep: Alcohol  Injectate: 0.5cc 0.5% marcaine plain, 0.5 cc 2% lidocaine plain and, 1 cc kenalog 10. Disposition: Patient tolerated procedure well. Injection site dressed with a band-aid.  Post-injection care was discussed and return precautions discussed.   RTC 3-4 weeks  Trula Slade DPM

## 2018-12-12 ENCOUNTER — Ambulatory Visit (INDEPENDENT_AMBULATORY_CARE_PROVIDER_SITE_OTHER): Payer: 59 | Admitting: Podiatry

## 2018-12-12 ENCOUNTER — Other Ambulatory Visit: Payer: Self-pay

## 2018-12-12 DIAGNOSIS — M722 Plantar fascial fibromatosis: Secondary | ICD-10-CM

## 2018-12-12 MED ORDER — METHYLPREDNISOLONE 4 MG PO TBPK: ORAL_TABLET | ORAL | 0 refills | Status: DC

## 2018-12-12 MED FILL — METHYLPREDNISOLONE 4 MG TAB: 4 | 6 days supply | Qty: 21 | Fill #0

## 2018-12-12 NOTE — Patient Instructions (Signed)
Stop the meloxicam and start the steroid. Once complete with the steroid then you can go back to the meloxicam  For instructions on how to put on your Night Splint, please visit [email or web address]   Plantar Fasciitis (Heel Spur Syndrome) with Rehab The plantar fascia is a fibrous, ligament-like, soft-tissue structure that spans the bottom of the foot. Plantar fasciitis is a condition that causes pain in the foot due to inflammation of the tissue. SYMPTOMS   Pain and tenderness on the underneath side of the foot.  Pain that worsens with standing or walking. CAUSES  Plantar fasciitis is caused by irritation and injury to the plantar fascia on the underneath side of the foot. Common mechanisms of injury include:  Direct trauma to bottom of the foot.  Damage to a small nerve that runs under the foot where the main fascia attaches to the heel bone.  Stress placed on the plantar fascia due to bone spurs. RISK INCREASES WITH:   Activities that place stress on the plantar fascia (running, jumping, pivoting, or cutting).  Poor strength and flexibility.  Improperly fitted shoes.  Tight calf muscles.  Flat feet.  Failure to warm-up properly before activity.  Obesity. PREVENTION  Warm up and stretch properly before activity.  Allow for adequate recovery between workouts.  Maintain physical fitness:  Strength, flexibility, and endurance.  Cardiovascular fitness.  Maintain a health body weight.  Avoid stress on the plantar fascia.  Wear properly fitted shoes, including arch supports for individuals who have flat feet.  PROGNOSIS  If treated properly, then the symptoms of plantar fasciitis usually resolve without surgery. However, occasionally surgery is necessary.  RELATED COMPLICATIONS   Recurrent symptoms that may result in a chronic condition.  Problems of the lower back that are caused by compensating for the injury, such as limping.  Pain or weakness of the  foot during push-off following surgery.  Chronic inflammation, scarring, and partial or complete fascia tear, occurring more often from repeated injections.  TREATMENT  Treatment initially involves the use of ice and medication to help reduce pain and inflammation. The use of strengthening and stretching exercises may help reduce pain with activity, especially stretches of the Achilles tendon. These exercises may be performed at home or with a therapist. Your caregiver may recommend that you use heel cups of arch supports to help reduce stress on the plantar fascia. Occasionally, corticosteroid injections are given to reduce inflammation. If symptoms persist for greater than 6 months despite non-surgical (conservative), then surgery may be recommended.   MEDICATION   If pain medication is necessary, then nonsteroidal anti-inflammatory medications, such as aspirin and ibuprofen, or other minor pain relievers, such as acetaminophen, are often recommended.  Do not take pain medication within 7 days before surgery.  Prescription pain relievers may be given if deemed necessary by your caregiver. Use only as directed and only as much as you need.  Corticosteroid injections may be given by your caregiver. These injections should be reserved for the most serious cases, because they may only be given a certain number of times.  HEAT AND COLD  Cold treatment (icing) relieves pain and reduces inflammation. Cold treatment should be applied for 10 to 15 minutes every 2 to 3 hours for inflammation and pain and immediately after any activity that aggravates your symptoms. Use ice packs or massage the area with a piece of ice (ice massage).  Heat treatment may be used prior to performing the stretching and strengthening activities prescribed by your caregiver,  physical therapist, or athletic trainer. Use a heat pack or soak the injury in warm water.  SEEK IMMEDIATE MEDICAL CARE IF:  Treatment seems to offer  no benefit, or the condition worsens.  Any medications produce adverse side effects.  EXERCISES- RANGE OF MOTION (ROM) AND STRETCHING EXERCISES - Plantar Fasciitis (Heel Spur Syndrome) These exercises may help you when beginning to rehabilitate your injury. Your symptoms may resolve with or without further involvement from your physician, physical therapist or athletic trainer. While completing these exercises, remember:   Restoring tissue flexibility helps normal motion to return to the joints. This allows healthier, less painful movement and activity.  An effective stretch should be held for at least 30 seconds.  A stretch should never be painful. You should only feel a gentle lengthening or release in the stretched tissue.  RANGE OF MOTION - Toe Extension, Flexion  Sit with your right / left leg crossed over your opposite knee.  Grasp your toes and gently pull them back toward the top of your foot. You should feel a stretch on the bottom of your toes and/or foot.  Hold this stretch for 10 seconds.  Now, gently pull your toes toward the bottom of your foot. You should feel a stretch on the top of your toes and or foot.  Hold this stretch for 10 seconds. Repeat  times. Complete this stretch 3 times per day.   RANGE OF MOTION - Ankle Dorsiflexion, Active Assisted  Remove shoes and sit on a chair that is preferably not on a carpeted surface.  Place right / left foot under knee. Extend your opposite leg for support.  Keeping your heel down, slide your right / left foot back toward the chair until you feel a stretch at your ankle or calf. If you do not feel a stretch, slide your bottom forward to the edge of the chair, while still keeping your heel down.  Hold this stretch for 10 seconds. Repeat 3 times. Complete this stretch 2 times per day.   STRETCH  Gastroc, Standing  Place hands on wall.  Extend right / left leg, keeping the front knee somewhat bent.  Slightly point your  toes inward on your back foot.  Keeping your right / left heel on the floor and your knee straight, shift your weight toward the wall, not allowing your back to arch.  You should feel a gentle stretch in the right / left calf. Hold this position for 10 seconds. Repeat 3 times. Complete this stretch 2 times per day.  STRETCH  Soleus, Standing  Place hands on wall.  Extend right / left leg, keeping the other knee somewhat bent.  Slightly point your toes inward on your back foot.  Keep your right / left heel on the floor, bend your back knee, and slightly shift your weight over the back leg so that you feel a gentle stretch deep in your back calf.  Hold this position for 10 seconds. Repeat 3 times. Complete this stretch 2 times per day.  STRETCH  Gastrocsoleus, Standing  Note: This exercise can place a lot of stress on your foot and ankle. Please complete this exercise only if specifically instructed by your caregiver.   Place the ball of your right / left foot on a step, keeping your other foot firmly on the same step.  Hold on to the wall or a rail for balance.  Slowly lift your other foot, allowing your body weight to press your heel down over  the edge of the step.  You should feel a stretch in your right / left calf.  Hold this position for 10 seconds.  Repeat this exercise with a slight bend in your right / left knee. Repeat 3 times. Complete this stretch 2 times per day.   STRENGTHENING EXERCISES - Plantar Fasciitis (Heel Spur Syndrome)  These exercises may help you when beginning to rehabilitate your injury. They may resolve your symptoms with or without further involvement from your physician, physical therapist or athletic trainer. While completing these exercises, remember:   Muscles can gain both the endurance and the strength needed for everyday activities through controlled exercises.  Complete these exercises as instructed by your physician, physical therapist or  athletic trainer. Progress the resistance and repetitions only as guided.  STRENGTH - Towel Curls  Sit in a chair positioned on a non-carpeted surface.  Place your foot on a towel, keeping your heel on the floor.  Pull the towel toward your heel by only curling your toes. Keep your heel on the floor. Repeat 3 times. Complete this exercise 2 times per day.  STRENGTH - Ankle Inversion  Secure one end of a rubber exercise band/tubing to a fixed object (table, pole). Loop the other end around your foot just before your toes.  Place your fists between your knees. This will focus your strengthening at your ankle.  Slowly, pull your big toe up and in, making sure the band/tubing is positioned to resist the entire motion.  Hold this position for 10 seconds.  Have your muscles resist the band/tubing as it slowly pulls your foot back to the starting position. Repeat 3 times. Complete this exercises 2 times per day.  Document Released: 01/12/2005 Document Revised: 04/06/2011 Document Reviewed: 04/26/2008 [facility] Patient Information 2014 [place], [place].

## 2018-12-19 NOTE — Progress Notes (Signed)
Subjective: 59 year old female presents the occipital evaluation left heel pain, plan fasciitis.  She states that overall the exercises have been helpful and the brace helps.  She still gets pain above the heel.  She feels that the injection was also helpful but she still having pain. Denies any systemic complaints such as fevers, chills, nausea, vomiting. No acute changes since last appointment, and no other complaints at this time.   Objective: AAO x3, NAD DP/PT pulses palpable bilaterally, CRT less than 3 seconds There is some improvement but continued tenderness palpation on the plantar medial tubercle of the calcaneus at the insertion of plantar fascia left foot.  Plantar fascial appears to be intact.  There is minimal edema to this area there is no erythema or warmth.  No pain with Achilles tendon.  No other areas of tenderness identified at this time. No pain with calf compression, swelling, warmth, erythema  Assessment: 58 year old female left foot plantar fasciitis  Plan: -All treatment options discussed with the patient including all alternatives, risks, complications.  -Steroid injection performed.  See procedure note below. -Night splint -Continue PF brace -Plantar fascial taping applied -Discussed shoe modifications and orthotics. -Patient encouraged to call the office with any questions, concerns, change in symptoms.   Procedure: Injection Tendon/Ligament Discussed alternatives, risks, complications and verbal consent was obtained.  Location: LEFT plantar fascia at the glabrous junction; medial approach. Skin Prep: Alcohol  Injectate: 0.5cc 0.5% marcaine plain, 0.5 cc 2% lidocaine plain and, 1 cc kenalog 10. Disposition: Patient tolerated procedure well. Injection site dressed with a band-aid.  Post-injection care was discussed and return precautions discussed.   Trula Slade DPM

## 2019-01-09 ENCOUNTER — Ambulatory Visit: Payer: 59 | Admitting: Podiatry

## 2019-01-31 ENCOUNTER — Ambulatory Visit: Payer: 59 | Admitting: Podiatry

## 2019-02-14 ENCOUNTER — Ambulatory Visit: Payer: 59 | Admitting: Podiatry

## 2019-02-28 MED FILL — ATORVASTATIN 20 MG TABLET: 20 | 90 days supply | Qty: 90 | Fill #0

## 2019-02-28 MED FILL — LISINOPRIL 10 MG TABS: 10 | 90 days supply | Qty: 90 | Fill #0

## 2019-02-28 MED FILL — HYDROCHLOROTHIAZIDE 25 MG T: 25 | 90 days supply | Qty: 90 | Fill #0

## 2019-06-12 ENCOUNTER — Other Ambulatory Visit (HOSPITAL_COMMUNITY): Payer: Self-pay | Admitting: Internal Medicine

## 2019-06-12 ENCOUNTER — Ambulatory Visit
Admission: RE | Admit: 2019-06-12 | Discharge: 2019-06-12 | Disposition: A | Discharge status: Home / Self Care | Payer: 59 | Source: Ambulatory Visit | Attending: Internal Medicine | Admitting: Internal Medicine

## 2019-06-12 ENCOUNTER — Other Ambulatory Visit: Payer: Self-pay | Admitting: Internal Medicine

## 2019-06-12 DIAGNOSIS — D509 Iron deficiency anemia, unspecified: Secondary | ICD-10-CM | POA: Diagnosis not present

## 2019-06-12 DIAGNOSIS — M533 Sacrococcygeal disorders, not elsewhere classified: Secondary | ICD-10-CM | POA: Diagnosis not present

## 2019-06-12 DIAGNOSIS — Z1211 Encounter for screening for malignant neoplasm of colon: Secondary | ICD-10-CM | POA: Diagnosis not present

## 2019-06-12 DIAGNOSIS — Z0000 Encounter for general adult medical examination without abnormal findings: Secondary | ICD-10-CM | POA: Diagnosis not present

## 2019-06-12 DIAGNOSIS — W19XXXA Unspecified fall, initial encounter: Secondary | ICD-10-CM | POA: Diagnosis not present

## 2019-06-12 DIAGNOSIS — Z1231 Encounter for screening mammogram for malignant neoplasm of breast: Secondary | ICD-10-CM | POA: Diagnosis not present

## 2019-06-12 DIAGNOSIS — I10 Essential (primary) hypertension: Secondary | ICD-10-CM | POA: Diagnosis not present

## 2019-06-12 DIAGNOSIS — E785 Hyperlipidemia, unspecified: Secondary | ICD-10-CM | POA: Diagnosis not present

## 2019-06-12 MED FILL — NAPROXEN 500 MG TABLET: 500 | 10 days supply | Qty: 20 | Fill #0

## 2019-06-14 MED FILL — tiZANidine HCL 4 MG TABS: 4 | 7 days supply | Qty: 21 | Fill #0

## 2019-08-15 ENCOUNTER — Other Ambulatory Visit: Payer: Self-pay | Admitting: Internal Medicine

## 2019-08-15 DIAGNOSIS — Z1231 Encounter for screening mammogram for malignant neoplasm of breast: Secondary | ICD-10-CM

## 2019-08-16 ENCOUNTER — Ambulatory Visit (INDEPENDENT_AMBULATORY_CARE_PROVIDER_SITE_OTHER): Payer: 59 | Admitting: Obstetrics & Gynecology

## 2019-08-16 ENCOUNTER — Other Ambulatory Visit: Payer: Self-pay

## 2019-08-16 ENCOUNTER — Encounter: Payer: Self-pay | Admitting: Obstetrics & Gynecology

## 2019-08-16 VITALS — BP 110/62 | HR 57 | Wt 273.3 lb

## 2019-08-16 DIAGNOSIS — Z01419 Encounter for gynecological examination (general) (routine) without abnormal findings: Secondary | ICD-10-CM

## 2019-08-16 DIAGNOSIS — E279 Disorder of adrenal gland, unspecified: Secondary | ICD-10-CM | POA: Insufficient documentation

## 2019-08-16 DIAGNOSIS — Z1211 Encounter for screening for malignant neoplasm of colon: Secondary | ICD-10-CM | POA: Diagnosis not present

## 2019-08-16 NOTE — Progress Notes (Signed)
GYNECOLOGY ANNUAL PREVENTATIVE CARE ENCOUNTER NOTE  History:     Marissa Cox is a 59 y.o. G73P3002 female here for a routine annual gynecologic exam. Had a TLH in 1990 for bleeding. Current complaints: vaginal dryness but not sexually active.   Denies abnormal vaginal bleeding, discharge, pelvic pain or other gynecologic concerns. Not sexually active as husband passed away three years ago, no partners since then.    Gynecologic History No LMP recorded. Patient has had a hysterectomy. Contraception: status post hysterectomy Last Pap: Before her hysterectomy.  Normal. Last mammogram: 08/25/2018. Results were: normal  Obstetric History OB History  Gravida Para Term Preterm AB Living  3 3 3  0 0 2  SAB TAB Ectopic Multiple Live Births  0 0 0 0 3    # Outcome Date GA Lbr Len/2nd Weight Sex Delivery Anes PTL Lv  3 Term      Vag-Spont   DEC  2 Term      Vag-Spont     1 Term      Vag-Spont       Past Medical History:  Diagnosis Date  . Hyperlipidemia   . Hypertension   . Morbid obesity ([place])     Past Surgical History:  Procedure Laterality Date  . LAPAROSCOPIC GASTRIC SLEEVE RESECTION  12/22/2011   Procedure: LAPAROSCOPIC GASTRIC SLEEVE RESECTION;  Surgeon: Madilyn Hook, DO;  Location: [facility];  Service: General;  Laterality: N/A;  . LAPAROSCOPIC TOTAL HYSTERECTOMY  1990  . MULTIPLE TOOTH EXTRACTIONS  1981  . TUBAL LIGATION  1990  . UPPER GI ENDOSCOPY  12/22/2011   Procedure: UPPER GI ENDOSCOPY;  Surgeon: Madilyn Hook, DO;  Location: [facility];  Service: General;;    Current Outpatient Medications on File Prior to Visit  Medication Sig Dispense Refill  . atorvastatin (LIPITOR) 20 MG tablet Take 20 mg by mouth daily.    . Coenzyme Q10 (CO Q 10 PO) Take by mouth.    . hydrochlorothiazide (HYDRODIURIL) 25 MG tablet Take 25 mg by mouth daily.    Marland Kitchen lisinopril (PRINIVIL,ZESTRIL) 10 MG tablet Take 10 mg by mouth daily.    . Multiple Vitamins-Minerals (MULTIVITAMIN WITH MINERALS)  tablet Take 1 tablet by mouth 2 (two) times daily.      No current facility-administered medications on file prior to visit.    No Known Allergies  Social History:  reports that she has been smoking. She has been smoking about 0.50 packs per day. She quit smokeless tobacco use about 8 years ago. She reports that she does not drink alcohol and does not use drugs.  Family History  Problem Relation Age of Onset  . Cancer Mother        bladder  . Cancer Maternal Aunt        breast  . Breast cancer Maternal Aunt   . Cancer Paternal Aunt        breast  . Breast cancer Paternal Aunt   . Cancer Paternal Aunt        breast  . Breast cancer Paternal Aunt   . Cancer Paternal Aunt        breast  . Cancer Maternal Aunt        ovarian    The following portions of the patient's history were reviewed and updated as appropriate: allergies, current medications, past family history, past medical history, past social history, past surgical history and problem list.  Review of Systems Pertinent items noted in HPI and remainder of comprehensive  ROS otherwise negative.  Physical Exam:  BP 110/62   Pulse (!) 57   Wt 273 lb 4.8 oz (124 kg)   BMI 42.80 kg/m  CONSTITUTIONAL: Well-developed, well-nourished female in no acute distress.  HENT:  Normocephalic, atraumatic, External right and left ear normal. Oropharynx is clear and moist EYES: Conjunctivae and EOM are normal. Pupils are equal, round, and reactive to light. No scleral icterus.  NECK: Normal range of motion, supple, no masses.  Normal thyroid.  SKIN: Skin is warm and dry. No rash noted. Not diaphoretic. No erythema. No pallor. MUSCULOSKELETAL: Normal range of motion. No tenderness.  No cyanosis, clubbing, or edema.  2+ distal pulses. NEUROLOGIC: Alert and oriented to person, place, and time. Normal reflexes, muscle tone coordination.  PSYCHIATRIC: Normal mood and affect. Normal behavior. Normal judgment and thought  content. CARDIOVASCULAR: Normal heart rate noted, regular rhythm RESPIRATORY: Clear to auscultation bilaterally. Effort and breath sounds normal, no problems with respiration noted. BREASTS: Symmetric in size. No masses, tenderness, skin changes, nipple drainage, or lymphadenopathy bilaterally. Performed in the presence of a chaperone. ABDOMEN: Soft, no distention noted.  No tenderness, rebound or guarding.  PELVIC: Normal appearing external genitalia and urethral meatus; atrophic appearing vaginal mucosa and well-healed cuff.  No abnormal discharge noted.  Performed in the presence of a chaperone.   Assessment and Plan:    1. Encounter for screening colonoscopy - Ambulatory referral to Gastroenterology, last one was 10 years ago.  2. Well woman exam with routine gynecological exam Desired STI screen - HIV Antibody (routine testing w rflx) - Hepatitis C antibody - Hepatitis B surface antigen - RPR Will follow up results of pap smear and manage accordingly. Mammogram scheduled on 08/21/2019 Routine preventative health maintenance measures emphasized. Please refer to After Visit Summary for other counseling recommendations.      Verita Schneiders, MD, [place] for [employer], [place]

## 2019-08-16 NOTE — Patient Instructions (Signed)

## 2019-08-17 LAB — HIV ANTIBODY (ROUTINE TESTING W REFLEX): HIV Screen 4th Generation wRfx: NONREACTIVE

## 2019-08-17 LAB — RPR: RPR Ser Ql: NONREACTIVE

## 2019-08-17 LAB — HEPATITIS C ANTIBODY: Hep C Virus Ab: <0.1 s/co ratio (ref 0.0–0.9)

## 2019-08-17 LAB — HEPATITIS B SURFACE ANTIGEN: Hepatitis B Surface Ag: NEGATIVE

## 2019-08-21 ENCOUNTER — Ambulatory Visit: Payer: 59

## 2019-08-25 ENCOUNTER — Ambulatory Visit
Admission: RE | Admit: 2019-08-25 | Discharge: 2019-08-25 | Disposition: A | Discharge status: Home / Self Care | Payer: 59 | Source: Ambulatory Visit | Attending: Internal Medicine | Admitting: Internal Medicine

## 2019-08-25 ENCOUNTER — Other Ambulatory Visit: Payer: Self-pay

## 2019-08-25 DIAGNOSIS — Z1231 Encounter for screening mammogram for malignant neoplasm of breast: Secondary | ICD-10-CM | POA: Diagnosis not present

## 2019-09-04 ENCOUNTER — Other Ambulatory Visit (HOSPITAL_COMMUNITY): Payer: Self-pay | Admitting: Internal Medicine

## 2019-09-04 MED FILL — LISINOPRIL 10 MG TABS: 10 | 90 days supply | Qty: 90 | Fill #0

## 2019-09-04 MED FILL — ATORVASTATIN 20 MG TABLET: 20 | 90 days supply | Qty: 90 | Fill #0

## 2019-09-04 MED FILL — HYDROCHLOROTHIAZIDE 25 MG T: 25 | 90 days supply | Qty: 90 | Fill #0

## 2019-09-26 ENCOUNTER — Ambulatory Visit: Payer: Self-pay | Attending: Critical Care Medicine

## 2019-09-26 DIAGNOSIS — Z23 Encounter for immunization: Secondary | ICD-10-CM

## 2019-09-26 NOTE — Progress Notes (Signed)
   Covid-19 Vaccination Clinic  Name:  Cynia Abruzzo    MRN: [id number] DOB: [birth year]  09/26/2019  Ms. Farver was observed post Covid-19 immunization for 15 minutes without incident. She was provided with Vaccine Information Sheet and instruction to access the V-Safe system.   Ms. Brinegar was instructed to call 911 with any severe reactions post vaccine: Marland Kitchen Difficulty breathing  . Swelling of face and throat  . A fast heartbeat  . A bad rash all over body  . Dizziness and weakness

## 2019-11-03 ENCOUNTER — Ambulatory Visit: Payer: 59 | Admitting: Podiatry

## 2019-11-06 ENCOUNTER — Ambulatory Visit (INDEPENDENT_AMBULATORY_CARE_PROVIDER_SITE_OTHER): Payer: 59

## 2019-11-06 ENCOUNTER — Ambulatory Visit: Payer: 59 | Admitting: Podiatry

## 2019-11-06 ENCOUNTER — Other Ambulatory Visit: Payer: Self-pay

## 2019-11-06 DIAGNOSIS — L84 Corns and callosities: Secondary | ICD-10-CM | POA: Diagnosis not present

## 2019-11-06 DIAGNOSIS — M79671 Pain in right foot: Secondary | ICD-10-CM

## 2019-11-06 DIAGNOSIS — S9031XA Contusion of right foot, initial encounter: Secondary | ICD-10-CM

## 2019-11-06 DIAGNOSIS — M2141 Flat foot [pes planus] (acquired), right foot: Secondary | ICD-10-CM | POA: Diagnosis not present

## 2019-11-06 DIAGNOSIS — M2142 Flat foot [pes planus] (acquired), left foot: Secondary | ICD-10-CM

## 2019-11-06 DIAGNOSIS — Q828 Other specified congenital malformations of skin: Secondary | ICD-10-CM

## 2019-11-06 NOTE — Progress Notes (Signed)
  Subjective:  Patient ID: Marissa Cox, female    DOB: [birth year],  MRN: [id number]  Chief Complaint  Patient presents with  . Foot Pain    Right foot pain at ball of foot. PT stated that by the middle of the day her toes hurt. Callous     59 y.o. female presents with the above complaint. History confirmed with patient.  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  Debridement of the hyperkeratoses has been helpful.  Objective:  Physical Exam: warm, good capillary refill, no trophic changes or ulcerative lesions, normal DP and PT pulses and normal sensory exam.   Right Foot: Punctate porokeratosis submet 1 and submet 5, painful to touch.  No evidence of verruca   Assessment:   1. Foot pain, right   2. Porokeratosis   3. Callus of foot   4. Pes planus of both feet      Plan:  Patient was evaluated and treated and all questions answered.  All symptomatic hyperkeratoses were safely debrided with a sterile #15 blade to patient's level of comfort without incident. We discussed preventative and palliative care of these lesions including supportive and accommodative shoegear, padding, prefabricated and custom molded accommodative orthoses, use of a pumice stone and lotions/creams daily.  Aperture offloading pads were dispensed today.  Following debridement salinocaine ointment was applied.  Advised that she should start using pumice stone and urea cream on this.    No follow-ups on file.

## 2019-11-10 DIAGNOSIS — I10 Essential (primary) hypertension: Secondary | ICD-10-CM | POA: Diagnosis not present

## 2019-11-10 DIAGNOSIS — S20211A Contusion of right front wall of thorax, initial encounter: Secondary | ICD-10-CM | POA: Diagnosis not present

## 2019-11-10 DIAGNOSIS — M25511 Pain in right shoulder: Secondary | ICD-10-CM | POA: Diagnosis not present

## 2019-11-13 ENCOUNTER — Other Ambulatory Visit (HOSPITAL_COMMUNITY): Payer: Self-pay

## 2019-11-13 MED FILL — NICOTINE 2 MG LOZENGE: 2 | 21 days supply | Qty: 72 | Fill #0

## 2019-11-13 MED FILL — NICOTINE 21 MG/24HR PATCH: 21 | 14 days supply | Qty: 14 | Fill #0

## 2019-11-15 MED FILL — LISINOPRIL 10 MG TABS: 10 | 90 days supply | Qty: 90 | Fill #1

## 2019-11-15 MED FILL — HYDROCHLOROTHIAZIDE 25 MG T: 25 | 90 days supply | Qty: 90 | Fill #1

## 2019-11-23 ENCOUNTER — Other Ambulatory Visit: Payer: Self-pay | Admitting: Podiatry

## 2019-11-23 DIAGNOSIS — S9031XA Contusion of right foot, initial encounter: Secondary | ICD-10-CM

## 2019-11-24 MED FILL — LISINOPRIL 10 MG TABS: 10 | 90 days supply | Qty: 90 | Fill #1

## 2019-11-24 MED FILL — ATORVASTATIN 20 MG TABLET: 20 | 90 days supply | Qty: 90 | Fill #1

## 2019-11-24 MED FILL — HYDROCHLOROTHIAZIDE 25 MG T: 25 | 90 days supply | Qty: 90 | Fill #1

## 2019-12-13 ENCOUNTER — Other Ambulatory Visit (HOSPITAL_COMMUNITY): Payer: Self-pay | Admitting: Internal Medicine

## 2019-12-13 DIAGNOSIS — Z1211 Encounter for screening for malignant neoplasm of colon: Secondary | ICD-10-CM | POA: Diagnosis not present

## 2019-12-13 DIAGNOSIS — F4322 Adjustment disorder with anxiety: Secondary | ICD-10-CM | POA: Diagnosis not present

## 2019-12-13 DIAGNOSIS — E785 Hyperlipidemia, unspecified: Secondary | ICD-10-CM | POA: Diagnosis not present

## 2019-12-13 DIAGNOSIS — F17210 Nicotine dependence, cigarettes, uncomplicated: Secondary | ICD-10-CM | POA: Diagnosis not present

## 2019-12-13 DIAGNOSIS — I10 Essential (primary) hypertension: Secondary | ICD-10-CM | POA: Diagnosis not present

## 2019-12-13 MED FILL — buPROPion HCL ER (XL) 150 M: 150 | 30 days supply | Qty: 30 | Fill #0

## 2020-04-19 MED FILL — HYDROCHLOROTHIAZIDE 25 MG T: 25 | 90 days supply | Qty: 90 | Fill #2

## 2020-04-19 MED FILL — LISINOPRIL 10 MG TABS: 10 | 90 days supply | Qty: 90 | Fill #2

## 2020-04-29 ENCOUNTER — Other Ambulatory Visit (HOSPITAL_COMMUNITY): Payer: Self-pay

## 2020-04-29 MED ORDER — MONTELUKAST SODIUM 10 MG PO TABS
10.0000 mg | ORAL_TABLET | Freq: Every day | ORAL | 0 refills | Status: DC
Start: 2020-04-29 — End: 2020-06-07
  Filled 2020-04-29: qty 30, 30d supply, fill #0

## 2020-04-30 ENCOUNTER — Other Ambulatory Visit (HOSPITAL_COMMUNITY): Payer: Self-pay

## 2020-04-30 MED FILL — Hydrochlorothiazide Tab 25 MG: ORAL | 90 days supply | Qty: 90 | Fill #0 | Status: AC

## 2020-04-30 MED FILL — Lisinopril Tab 10 MG: ORAL | 90 days supply | Qty: 90 | Fill #0 | Status: AC

## 2020-05-01 ENCOUNTER — Other Ambulatory Visit (HOSPITAL_COMMUNITY): Payer: Self-pay

## 2020-05-06 ENCOUNTER — Other Ambulatory Visit (HOSPITAL_COMMUNITY): Payer: Self-pay

## 2020-05-06 DIAGNOSIS — J449 Chronic obstructive pulmonary disease, unspecified: Secondary | ICD-10-CM | POA: Diagnosis not present

## 2020-05-06 DIAGNOSIS — F17210 Nicotine dependence, cigarettes, uncomplicated: Secondary | ICD-10-CM | POA: Diagnosis not present

## 2020-05-06 MED ORDER — BREZTRI AEROSPHERE 160-9-4.8 MCG/ACT IN AERO
2.0000 | INHALATION_SPRAY | Freq: Two times a day (BID) | RESPIRATORY_TRACT | 1 refills | Status: DC
  Filled 2020-05-06: qty 10.7, 30d supply, fill #0
  Filled 2020-07-26: qty 10.7, 30d supply, fill #1

## 2020-05-06 MED ORDER — VARENICLINE TARTRATE 1 MG PO TABS
1.0000 mg | ORAL_TABLET | Freq: Two times a day (BID) | ORAL | 1 refills | Status: DC
  Filled 2020-05-06: qty 60, 30d supply, fill #0

## 2020-05-06 MED ORDER — ALBUTEROL SULFATE HFA 108 (90 BASE) MCG/ACT IN AERS
1.0000 | INHALATION_SPRAY | RESPIRATORY_TRACT | 1 refills | Status: DC
  Filled 2020-05-06: qty 18, 33d supply, fill #0

## 2020-05-07 ENCOUNTER — Other Ambulatory Visit (HOSPITAL_COMMUNITY): Payer: Self-pay

## 2020-05-09 ENCOUNTER — Other Ambulatory Visit (HOSPITAL_COMMUNITY): Payer: Self-pay

## 2020-05-29 ENCOUNTER — Ambulatory Visit: Payer: 59

## 2020-05-30 ENCOUNTER — Institutional Professional Consult (permissible substitution): Payer: 59 | Admitting: Pulmonary Disease

## 2020-05-30 DIAGNOSIS — D509 Iron deficiency anemia, unspecified: Secondary | ICD-10-CM | POA: Insufficient documentation

## 2020-05-30 DIAGNOSIS — J441 Chronic obstructive pulmonary disease with (acute) exacerbation: Secondary | ICD-10-CM | POA: Insufficient documentation

## 2020-05-30 DIAGNOSIS — M1990 Unspecified osteoarthritis, unspecified site: Secondary | ICD-10-CM | POA: Insufficient documentation

## 2020-05-30 DIAGNOSIS — M545 Low back pain, unspecified: Secondary | ICD-10-CM | POA: Insufficient documentation

## 2020-05-30 DIAGNOSIS — J410 Simple chronic bronchitis: Secondary | ICD-10-CM | POA: Insufficient documentation

## 2020-05-30 DIAGNOSIS — Z720 Tobacco use: Secondary | ICD-10-CM | POA: Insufficient documentation

## 2020-05-30 DIAGNOSIS — N390 Urinary tract infection, site not specified: Secondary | ICD-10-CM | POA: Insufficient documentation

## 2020-05-30 DIAGNOSIS — Z6841 Body Mass Index (BMI) 40.0 and over, adult: Secondary | ICD-10-CM | POA: Insufficient documentation

## 2020-05-30 DIAGNOSIS — I10 Essential (primary) hypertension: Secondary | ICD-10-CM | POA: Insufficient documentation

## 2020-05-30 DIAGNOSIS — F4322 Adjustment disorder with anxiety: Secondary | ICD-10-CM | POA: Insufficient documentation

## 2020-05-30 DIAGNOSIS — J45901 Unspecified asthma with (acute) exacerbation: Secondary | ICD-10-CM | POA: Insufficient documentation

## 2020-05-30 DIAGNOSIS — J449 Chronic obstructive pulmonary disease, unspecified: Secondary | ICD-10-CM | POA: Insufficient documentation

## 2020-05-30 DIAGNOSIS — J4520 Mild intermittent asthma, uncomplicated: Secondary | ICD-10-CM | POA: Insufficient documentation

## 2020-05-30 DIAGNOSIS — E785 Hyperlipidemia, unspecified: Secondary | ICD-10-CM | POA: Insufficient documentation

## 2020-05-30 DIAGNOSIS — F419 Anxiety disorder, unspecified: Secondary | ICD-10-CM | POA: Insufficient documentation

## 2020-06-06 ENCOUNTER — Other Ambulatory Visit (HOSPITAL_COMMUNITY): Payer: Self-pay

## 2020-06-07 ENCOUNTER — Other Ambulatory Visit: Payer: Self-pay

## 2020-06-07 ENCOUNTER — Ambulatory Visit (AMBULATORY_SURGERY_CENTER): Payer: 59 | Admitting: *Deleted

## 2020-06-07 ENCOUNTER — Other Ambulatory Visit (HOSPITAL_COMMUNITY): Payer: Self-pay

## 2020-06-07 VITALS — Ht 67.0 in | Wt 270.0 lb

## 2020-06-07 DIAGNOSIS — Z1211 Encounter for screening for malignant neoplasm of colon: Secondary | ICD-10-CM

## 2020-06-07 MED ORDER — SUPREP BOWEL PREP KIT 17.5-3.13-1.6 GM/177ML PO SOLN
1.0000 | Freq: Once | ORAL | 0 refills | Status: AC
Start: 2020-06-07 — End: 2020-06-20
  Filled 2020-06-07 – 2020-06-19 (×2): qty 354, 1d supply, fill #0

## 2020-06-07 MED ORDER — MONTELUKAST SODIUM 10 MG PO TABS
1.0000 | ORAL_TABLET | Freq: Every day | ORAL | 0 refills | Status: DC
  Filled 2020-06-07 – 2020-06-19 (×2): qty 30, 30d supply, fill #0

## 2020-06-07 NOTE — Progress Notes (Signed)

## 2020-06-17 ENCOUNTER — Encounter: Payer: Self-pay | Admitting: Internal Medicine

## 2020-06-17 ENCOUNTER — Other Ambulatory Visit (HOSPITAL_COMMUNITY): Payer: Self-pay

## 2020-06-19 ENCOUNTER — Other Ambulatory Visit (HOSPITAL_COMMUNITY): Payer: Self-pay

## 2020-06-21 ENCOUNTER — Encounter: Payer: Self-pay | Admitting: Internal Medicine

## 2020-06-21 ENCOUNTER — Ambulatory Visit (AMBULATORY_SURGERY_CENTER): Payer: 59 | Admitting: Internal Medicine

## 2020-06-21 ENCOUNTER — Other Ambulatory Visit: Payer: Self-pay

## 2020-06-21 VITALS — BP 100/75 | HR 65 | Temp 97.9°F | Resp 19 | Ht 67.0 in | Wt 270.0 lb

## 2020-06-21 DIAGNOSIS — D123 Benign neoplasm of transverse colon: Secondary | ICD-10-CM | POA: Diagnosis not present

## 2020-06-21 DIAGNOSIS — Z1211 Encounter for screening for malignant neoplasm of colon: Secondary | ICD-10-CM

## 2020-06-21 DIAGNOSIS — D120 Benign neoplasm of cecum: Secondary | ICD-10-CM | POA: Diagnosis not present

## 2020-06-21 DIAGNOSIS — D126 Benign neoplasm of colon, unspecified: Secondary | ICD-10-CM

## 2020-06-21 MED ORDER — SODIUM CHLORIDE 0.9 % IV SOLN: 500.0000 mL | Freq: Once | INTRAVENOUS | Status: DC

## 2020-06-21 NOTE — Progress Notes (Signed)
PT taken to PACU. Monitors in place. VSS. Report given to RN. 

## 2020-06-21 NOTE — Progress Notes (Signed)
Called to room to assist during endoscopic procedure.  Patient ID and intended procedure confirmed with present staff. Received instructions for my participation in the procedure from the performing physician.  

## 2020-06-21 NOTE — Op Note (Signed)
[place] Patient Name: Marissa Cox Procedure Date: 06/21/2020 12:05 PM MRN: [id number] Endoscopist: Jerene Bears , MD Age: 60 Referring MD:  Date of Birth: September 09, 1960 Gender: Female Account #: [id number] Procedure:                Colonoscopy Indications:              Screening for colorectal malignant neoplasm, Last                            colonoscopy: 2011 Medicines:                Monitored Anesthesia Care Procedure:                Pre-Anesthesia Assessment:                           - Prior to the procedure, a History and Physical                            was performed, and patient medications and                            allergies were reviewed. The patient's tolerance of                            previous anesthesia was also reviewed. The risks                            and benefits of the procedure and the sedation                            options and risks were discussed with the patient.                            All questions were answered, and informed consent                            was obtained. Prior Anticoagulants: The patient has                            taken no previous anticoagulant or antiplatelet                            agents. ASA Grade Assessment: III - A patient with                            severe systemic disease. After reviewing the risks                            and benefits, the patient was deemed in                            satisfactory condition to undergo the procedure.  After obtaining informed consent, the colonoscope                            was passed under direct vision. Throughout the                            procedure, the patient's blood pressure, pulse, and                            oxygen saturations were monitored continuously. The                            Olympus CF-HQ190 [id number]) Colonoscope was                            introduced through the anus and advanced to  the                            cecum, identified by appendiceal orifice and                            ileocecal valve. The colonoscopy was performed                            without difficulty. The patient tolerated the                            procedure well. The quality of the bowel                            preparation was good. The ileocecal valve,                            appendiceal orifice, and rectum were photographed. Scope In: 12:16:12 PM Scope Out: 12:30:51 PM Scope Withdrawal Time: 0 hours 12 minutes 27 seconds  Total Procedure Duration: 0 hours 14 minutes 39 seconds  Findings:                 Skin tags were found on perianal exam.                           Two sessile polyps were found in the cecum. The                            polyps were 4 to 5 mm in size. These polyps were                            removed with a cold snare. Resection and retrieval                            were complete.                           Two sessile polyps were found in the hepatic  flexure. The polyps were 4 to 5 mm in size. These                            polyps were removed with a cold snare. Resection                            and retrieval were complete.                           A 4 mm polyp was found in the transverse colon. The                            polyp was sessile. The polyp was removed with a                            cold snare. Resection and retrieval were complete.                           Multiple small and large-mouthed diverticula were                            found in the sigmoid colon, descending colon,                            transverse colon and ascending colon.                           Skin tag in the anal canal near dentate line. No                            additional abnormalities were found on retroflexion. Complications:            No immediate complications. Estimated Blood Loss:     Estimated blood loss was  minimal. Impression:               - Perianal skin tags found on perianal exam.                           - Two 4 to 5 mm polyps in the cecum, removed with a                            cold snare. Resected and retrieved.                           - Two 4 to 5 mm polyps at the hepatic flexure,                            removed with a cold snare. Resected and retrieved.                           - One 4 mm polyp in the transverse colon, removed  with a cold snare. Resected and retrieved.                           - Diverticulosis in the sigmoid colon, in the                            descending colon, in the transverse colon and in                            the ascending colon. Recommendation:           - Patient has a contact number available for                            emergencies. The signs and symptoms of potential                            delayed complications were discussed with the                            patient. Return to normal activities tomorrow.                            Written discharge instructions were provided to the                            patient.                           - Resume previous diet.                           - Continue present medications.                           - Await pathology results.                           - Repeat colonoscopy is recommended. The                            colonoscopy date will be determined after pathology                            results from today's exam become available for                            review. Jerene Bears, MD 06/21/2020 12:35:01 PM This report has been signed electronically.

## 2020-06-21 NOTE — Patient Instructions (Signed)
Information on polyps and diverticulosis given to you today.  Await pathology results.  Resume previous diet and medications.  YOU HAD AN ENDOSCOPIC PROCEDURE TODAY AT THE [facility]:   Refer to the procedure report that was given to you for any specific questions about what was found during the examination.  If the procedure report does not answer your questions, please call your gastroenterologist to clarify.  If you requested that your care partner not be given the details of your procedure findings, then the procedure report has been included in a sealed envelope for you to review at your convenience later.  YOU SHOULD EXPECT: Some feelings of bloating in the abdomen. Passage of more gas than usual.  Walking can help get rid of the air that was put into your GI tract during the procedure and reduce the bloating. If you had a lower endoscopy (such as a colonoscopy or flexible sigmoidoscopy) you may notice spotting of blood in your stool or on the toilet paper. If you underwent a bowel prep for your procedure, you may not have a normal bowel movement for a few days.  Please Note:  You might notice some irritation and congestion in your nose or some drainage.  This is from the oxygen used during your procedure.  There is no need for concern and it should clear up in a day or so.  SYMPTOMS TO REPORT IMMEDIATELY:   Following lower endoscopy (colonoscopy or flexible sigmoidoscopy):  Excessive amounts of blood in the stool  Significant tenderness or worsening of abdominal pains  Swelling of the abdomen that is new, acute  Fever of 100F or higher   For urgent or emergent issues, a gastroenterologist can be reached at any hour by calling [phone]. Do not use MyChart messaging for urgent concerns.    DIET:  We do recommend a small meal at first, but then you may proceed to your regular diet.  Drink plenty of fluids but you should avoid alcoholic beverages for 24  hours.  ACTIVITY:  You should plan to take it easy for the rest of today and you should NOT DRIVE or use heavy machinery until tomorrow (because of the sedation medicines used during the test).    FOLLOW UP: Our staff will call the number listed on your records 48-72 hours following your procedure to check on you and address any questions or concerns that you may have regarding the information given to you following your procedure. If we do not reach you, we will leave a message.  We will attempt to reach you two times.  During this call, we will ask if you have developed any symptoms of COVID 19. If you develop any symptoms (ie: fever, flu-like symptoms, shortness of breath, cough etc.) before then, please call [phone].  If you test positive for Covid 19 in the 2 weeks post procedure, please call and report this information to us.    If any biopsies were taken you will be contacted by phone or by letter within the next 1-3 weeks.  Please call us at [phone] if you have not heard about the biopsies in 3 weeks.    SIGNATURES/CONFIDENTIALITY: You and/or your care partner have signed paperwork which will be entered into your electronic medical record.  These signatures attest to the fact that that the information above on your After Visit Summary has been reviewed and is understood.  Full responsibility of the confidentiality of this discharge information lies with you and/or   your care-partner. 

## 2020-06-21 NOTE — Progress Notes (Signed)
VS by [place]  Pt's states no medical or surgical changes since previsit or office visit.  

## 2020-06-25 ENCOUNTER — Telehealth: Payer: Self-pay | Admitting: *Deleted

## 2020-06-25 NOTE — Telephone Encounter (Signed)
  Follow up Call-  Call back number 06/21/2020  Post procedure Call Back phone  # [phone]  Permission to leave phone message Yes  Some recent data might be hidden     Patient questions:  Do you have a fever, pain , or abdominal swelling? No. Pain Score  0 *  Have you tolerated food without any problems? Yes.    Have you been able to return to your normal activities? Yes.    Do you have any questions about your discharge instructions: Diet   No. Medications  No. Follow up visit  No  Do you have questions or concerns about your Care? No.  Actions: * If pain score is 4 or above: No action needed, pain <4.  1. Have you developed a fever since your procedure? no  2.   Have you had an respiratory symptoms (SOB or cough) since your procedure? no  3.   Have you tested positive for COVID 19 since your procedure no  4.   Have you had any family members/close contacts diagnosed with the COVID 19 since your procedure?  no   If yes to any of these questions please route to Joylene John, RN and Joella Prince, RN

## 2020-07-01 ENCOUNTER — Encounter: Payer: Self-pay | Admitting: Internal Medicine

## 2020-07-04 ENCOUNTER — Ambulatory Visit (HOSPITAL_COMMUNITY): Payer: Self-pay

## 2020-07-04 ENCOUNTER — Other Ambulatory Visit (HOSPITAL_COMMUNITY): Payer: Self-pay

## 2020-07-04 DIAGNOSIS — J019 Acute sinusitis, unspecified: Secondary | ICD-10-CM | POA: Diagnosis not present

## 2020-07-04 DIAGNOSIS — J441 Chronic obstructive pulmonary disease with (acute) exacerbation: Secondary | ICD-10-CM | POA: Diagnosis not present

## 2020-07-04 MED ORDER — AMOXICILLIN-POT CLAVULANATE 875-125 MG PO TABS
1.0000 | ORAL_TABLET | Freq: Two times a day (BID) | ORAL | 0 refills | Status: DC
  Filled 2020-07-04: qty 14, 7d supply, fill #0

## 2020-07-23 ENCOUNTER — Other Ambulatory Visit: Payer: Self-pay | Admitting: Internal Medicine

## 2020-07-23 DIAGNOSIS — Z1231 Encounter for screening mammogram for malignant neoplasm of breast: Secondary | ICD-10-CM

## 2020-07-26 ENCOUNTER — Other Ambulatory Visit (HOSPITAL_COMMUNITY): Payer: Self-pay

## 2020-07-26 MED ORDER — FLUCONAZOLE 150 MG PO TABS
ORAL_TABLET | ORAL | 0 refills | Status: DC
  Filled 2020-07-26: qty 2, 2d supply, fill #0

## 2020-07-26 MED ORDER — HYDROCHLOROTHIAZIDE 25 MG PO TABS
25.0000 mg | ORAL_TABLET | Freq: Every day | ORAL | 1 refills | Status: DC
  Filled 2020-07-26: qty 90, 90d supply, fill #0
  Filled 2021-03-06: qty 90, 90d supply, fill #1

## 2020-07-26 MED ORDER — LISINOPRIL 10 MG PO TABS
10.0000 mg | ORAL_TABLET | Freq: Every day | ORAL | 1 refills | Status: DC
  Filled 2020-07-26: qty 90, 90d supply, fill #0
  Filled 2021-07-20: qty 90, 90d supply, fill #1

## 2020-07-26 MED FILL — Atorvastatin Calcium Tab 20 MG (Base Equivalent): ORAL | 90 days supply | Qty: 90 | Fill #0 | Status: AC

## 2020-08-14 ENCOUNTER — Ambulatory Visit (INDEPENDENT_AMBULATORY_CARE_PROVIDER_SITE_OTHER): Payer: 59 | Admitting: Advanced Practice Midwife

## 2020-08-14 ENCOUNTER — Other Ambulatory Visit: Payer: Self-pay

## 2020-08-14 ENCOUNTER — Encounter: Payer: Self-pay | Admitting: Advanced Practice Midwife

## 2020-08-14 ENCOUNTER — Other Ambulatory Visit (HOSPITAL_COMMUNITY): Payer: Self-pay

## 2020-08-14 ENCOUNTER — Other Ambulatory Visit (HOSPITAL_COMMUNITY)
Admission: RE | Admit: 2020-08-14 | Discharge: 2020-08-14 | Disposition: A | Discharge status: Home / Self Care | Payer: 59 | Source: Ambulatory Visit | Attending: Advanced Practice Midwife | Admitting: Advanced Practice Midwife

## 2020-08-14 VITALS — BP 134/62 | HR 78 | Wt 269.7 lb

## 2020-08-14 DIAGNOSIS — B3789 Other sites of candidiasis: Secondary | ICD-10-CM

## 2020-08-14 DIAGNOSIS — N898 Other specified noninflammatory disorders of vagina: Secondary | ICD-10-CM | POA: Insufficient documentation

## 2020-08-14 MED ORDER — NYSTATIN 100000 UNIT/GM EX CREA
1.0000 | TOPICAL_CREAM | Freq: Two times a day (BID) | CUTANEOUS | 1 refills | Status: DC
Start: 2020-08-14 — End: 2021-10-29
  Filled 2020-08-14: qty 30, 15d supply, fill #0
  Filled 2021-05-07: qty 30, 15d supply, fill #1

## 2020-08-14 MED ORDER — FLUCONAZOLE 150 MG PO TABS
150.0000 mg | ORAL_TABLET | Freq: Once | ORAL | 0 refills | Status: AC
  Filled 2020-08-14 (×2): qty 1, 1d supply, fill #0

## 2020-08-14 NOTE — Progress Notes (Signed)
Reports developing sinus and ear infection on 07/03/20. Was given Augmentin for treatment. Reports this caused diarrhea for 5 days and rash to inner thighs with itching. When pt notified provider, she was given Diflucan 150 mg x 2. States this improved symptoms but did not completely resolve itching.   Apolonio Schneiders RN 08/14/20

## 2020-08-14 NOTE — Progress Notes (Signed)
GYNECOLOGY PROBLEM VISIT NOTE  History:     Marissa Cox is a 60 y.o. [phone] female here evaluation of abnormal vaginal discharge and a bilateral rash along her groin.  She is s/p treatment with Augmentin for sinus and bilateral ear infections at the beginning of June. Both complaints began during her Augmentin course. Patient was treated with Diflucan x 2 but her symptoms have not resolved. Denies abnormal vaginal bleeding, discharge, pelvic pain, problems with intercourse or other gynecologic concerns.    Gynecologic History No LMP recorded. Patient has had a hysterectomy. Contraception: abstinence Last Pap: Prior to hysterectomy. Results were: normal with negative HPV Last mammogram: 08/2019 Results were: normal  Obstetric History OB History  Gravida Para Term Preterm AB Living  3 3 3  0 0 2  SAB IAB Ectopic Multiple Live Births  0 0 0 0 3    # Outcome Date GA Lbr Len/2nd Weight Sex Delivery Anes PTL Lv  3 Term      Vag-Spont   DEC  2 Term      Vag-Spont     1 Term      Vag-Spont       Past Medical History:  Diagnosis Date   Allergy    Anemia    Arthritis    not diagnosed - in knees per pt    COPD (chronic obstructive pulmonary disease) ([place])    ? per PCP- ref to pulmonary    GERD (gastroesophageal reflux disease)    occ    Hyperlipidemia    Hypertension    Morbid obesity ([place])    PONV (postoperative nausea and vomiting)     Past Surgical History:  Procedure Laterality Date   COLONOSCOPY     LAPAROSCOPIC GASTRIC SLEEVE RESECTION  12/22/2011   Procedure: LAPAROSCOPIC GASTRIC SLEEVE RESECTION;  Surgeon: Madilyn Hook, DO;  Location: [facility];  Service: General;  Laterality: N/A;   [place] GASTROINTESTINAL ENDOSCOPY     UPPER GI ENDOSCOPY  12/22/2011   Procedure: UPPER GI ENDOSCOPY;  Surgeon: Madilyn Hook, DO;  Location: [facility];  Service: General;;     Current Outpatient Medications on File Prior to Visit  Medication Sig Dispense Refill   albuterol (VENTOLIN HFA) 108 (90 Base) MCG/ACT inhaler Inhale 1 puff into the lungs every 4 (four) hours. 18 g 1   atorvastatin (LIPITOR) 20 MG tablet TAKE 1 TABLET BY MOUTH ONCE DAILY. 90 tablet 3   BIOTIN PO Take by mouth daily.     Budeson-Glycopyrrol-Formoterol (BREZTRI AEROSPHERE) 160-9-4.8 MCG/ACT AERO Take 2 puffs by mouth 2 (two) times daily. 10.7 g 1   Coenzyme Q10 (CO Q 10 PO) Take by mouth.     ferrous sulfate 325 (65 FE) MG EC tablet Take 325 mg by mouth 3 (three) times daily with meals.     hydrochlorothiazide (HYDRODIURIL) 25 MG tablet Take 1 tablet (25 mg total) by mouth daily. 90 tablet 1   lisinopril (ZESTRIL) 10 MG tablet Take 1 tablet (10 mg total) by mouth daily. 90 tablet 1   montelukast (SINGULAIR) 10 MG tablet Take 1 tablet (10 mg total) by mouth daily. 30 tablet 0   Multiple Vitamins-Minerals (MULTIVITAMIN WITH MINERALS) tablet Take 1 tablet by mouth 2 (two) times daily.      varenicline (CHANTIX) 1 MG tablet Take 1 tablet (1 mg total) by mouth 2 (two) times daily. (Patient  not taking: No sig reported) 60 tablet 1   Current Facility-Administered Medications on File Prior to Visit  Medication Dose Route Frequency Provider Last Rate Last Admin   0.9 %  sodium chloride infusion  500 mL Intravenous Once Pyrtle, Lajuan Lines, MD        Allergies  Allergen Reactions   Other     Other reaction(s): nightmares with Chantix- is now on lower dose    Tramadol Hcl     Other reaction(s): vomiting    Social History:  reports that she has been smoking cigarettes. She has been smoking an average of .5 packs per day. She quit smokeless tobacco use about 9 years ago. She reports current alcohol use. She reports that she does not use drugs.  Family History  Problem Relation Age of Onset   Cancer Mother        bladder   Bladder Cancer Mother    Cancer Maternal Aunt        breast   Breast  cancer Maternal Aunt    Cancer Paternal Aunt        breast   Breast cancer Paternal Aunt    Cancer Paternal Aunt        breast   Breast cancer Paternal Aunt    Cancer Paternal Aunt        breast   Cancer Maternal Aunt        ovarian   Breast cancer Maternal Aunt    Colon cancer Neg Hx    Colon polyps Neg Hx    Esophageal cancer Neg Hx    Rectal cancer Neg Hx    Stomach cancer Neg Hx     The following portions of the patient's history were reviewed and updated as appropriate: allergies, current medications, past family history, past medical history, past social history, past surgical history and problem list.  Review of Systems Pertinent items noted in HPI and remainder of comprehensive ROS otherwise negative.  Physical Exam:  BP 134/62   Pulse 78   Wt 269 lb 11.2 oz (122.3 kg)   BMI 42.24 kg/m  CONSTITUTIONAL: Well-developed, well-nourished female in no acute distress.  HENT:  Normocephalic, atraumatic, External right and left ear normal.  EYES: Conjunctivae and EOM are normal. Pupils are equal, round, and reactive to light. No scleral icterus.  NECK: Normal range of motion, supple, no masses.  Normal thyroid.  SKIN: Skin is warm and dry. No rash noted. Not diaphoretic. No erythema. No pallor. MUSCULOSKELETAL: Normal range of motion. No tenderness.  No cyanosis, clubbing, or edema. NEUROLOGIC: Alert and oriented to person, place, and time. Normal reflexes, muscle tone coordination.  PSYCHIATRIC: Normal mood and affect. Normal behavior. Normal judgment and thought content. CARDIOVASCULAR: Normal heart rate noted, regular rhythm RESPIRATORY: Clear to auscultation bilaterally. Effort and breath sounds normal, no problems with respiration noted. BREASTS: Deferred ABDOMEN: Soft, no distention noted.  No tenderness, rebound or guarding.  PELVIC: Normal appearing external genitalia and urethral meatus; normal appearing vaginal mucosa and cervix.  Bilateral white rash along groin.  Swab collected via blind swab. Performed in the presence of a chaperone.   Assessment and Plan:    1. Candida rash of groin - bilateral white rash c/w yeast  2. Vaginal discharge - Pt prefers to initiate Diflucan rather than wait for swab results - Cervicovaginal ancillary only( [place])  Will follow up results of swab and manage accordingly. Mammogram already scheduled Routine preventative health maintenance measures emphasized. Please refer to After  Visit Summary for other counseling recommendations.    Total visit time: 30 minutes. Greater than 50% of visit spent in counseling and coordination of care.  Mallie Snooks, MSN, CNM Certified Nurse Midwife, [facility] for [employer], [facility] 08/14/20 12:34 PM

## 2020-08-15 LAB — CERVICOVAGINAL ANCILLARY ONLY
Bacterial Vaginitis (gardnerella): NEGATIVE
Candida Glabrata: NEGATIVE
Candida Vaginitis: NEGATIVE
Comment: NEGATIVE
Comment: NEGATIVE
Comment: NEGATIVE

## 2020-09-10 ENCOUNTER — Other Ambulatory Visit (HOSPITAL_COMMUNITY): Payer: Self-pay

## 2020-09-10 MED ORDER — CARESTART COVID-19 HOME TEST VI KIT
PACK | 0 refills | Status: DC
  Filled 2020-09-10: qty 2, 2d supply, fill #0

## 2020-09-16 ENCOUNTER — Ambulatory Visit
Admission: RE | Admit: 2020-09-16 | Discharge: 2020-09-16 | Disposition: A | Discharge status: Home / Self Care | Payer: 59 | Source: Ambulatory Visit | Attending: Internal Medicine | Admitting: Internal Medicine

## 2020-09-16 ENCOUNTER — Other Ambulatory Visit: Payer: Self-pay

## 2020-09-16 DIAGNOSIS — Z1231 Encounter for screening mammogram for malignant neoplasm of breast: Secondary | ICD-10-CM

## 2020-10-14 ENCOUNTER — Other Ambulatory Visit (HOSPITAL_COMMUNITY): Payer: Self-pay

## 2020-10-14 DIAGNOSIS — E785 Hyperlipidemia, unspecified: Secondary | ICD-10-CM | POA: Diagnosis not present

## 2020-10-14 DIAGNOSIS — M1990 Unspecified osteoarthritis, unspecified site: Secondary | ICD-10-CM | POA: Diagnosis not present

## 2020-10-14 DIAGNOSIS — F17210 Nicotine dependence, cigarettes, uncomplicated: Secondary | ICD-10-CM | POA: Diagnosis not present

## 2020-10-14 DIAGNOSIS — D509 Iron deficiency anemia, unspecified: Secondary | ICD-10-CM | POA: Diagnosis not present

## 2020-10-14 DIAGNOSIS — Z0000 Encounter for general adult medical examination without abnormal findings: Secondary | ICD-10-CM | POA: Diagnosis not present

## 2020-10-14 DIAGNOSIS — Z6841 Body Mass Index (BMI) 40.0 and over, adult: Secondary | ICD-10-CM | POA: Diagnosis not present

## 2020-10-14 DIAGNOSIS — J45901 Unspecified asthma with (acute) exacerbation: Secondary | ICD-10-CM | POA: Diagnosis not present

## 2020-10-14 DIAGNOSIS — I10 Essential (primary) hypertension: Secondary | ICD-10-CM | POA: Diagnosis not present

## 2020-10-14 MED ORDER — LISINOPRIL 10 MG PO TABS
10.0000 mg | ORAL_TABLET | Freq: Every day | ORAL | 4 refills | Status: DC
  Filled 2020-10-14: qty 90, 90d supply, fill #0
  Filled 2021-01-29 – 2021-03-18 (×2): qty 90, 90d supply, fill #1

## 2020-10-14 MED ORDER — ATORVASTATIN CALCIUM 20 MG PO TABS
20.0000 mg | ORAL_TABLET | Freq: Every day | ORAL | 4 refills | Status: DC
  Filled 2020-10-14: qty 90, 90d supply, fill #0
  Filled 2021-03-06: qty 90, 90d supply, fill #1
  Filled 2021-05-07: qty 90, 90d supply, fill #2

## 2020-10-14 MED ORDER — ALBUTEROL SULFATE HFA 108 (90 BASE) MCG/ACT IN AERS
1.0000 | INHALATION_SPRAY | RESPIRATORY_TRACT | 5 refills | Status: DC | PRN
  Filled 2020-10-14: qty 18, 30d supply, fill #0
  Filled 2021-03-06: qty 18, 30d supply, fill #1
  Filled 2021-05-07: qty 18, 30d supply, fill #2
  Filled 2021-07-20: qty 18, 30d supply, fill #3

## 2020-10-14 MED ORDER — MONTELUKAST SODIUM 10 MG PO TABS
10.0000 mg | ORAL_TABLET | Freq: Every day | ORAL | 11 refills | Status: DC
  Filled 2020-10-14: qty 30, 30d supply, fill #0
  Filled 2021-05-07: qty 30, 30d supply, fill #1

## 2020-10-14 MED ORDER — ALBUTEROL SULFATE (2.5 MG/3ML) 0.083% IN NEBU
2.5000 mg | INHALATION_SOLUTION | Freq: Four times a day (QID) | RESPIRATORY_TRACT | 2 refills | Status: AC | PRN
  Filled 2020-10-14: qty 75, 7d supply, fill #0
  Filled 2021-03-06: qty 75, 7d supply, fill #1
  Filled 2021-07-20: qty 75, 7d supply, fill #2

## 2020-10-14 MED ORDER — HYDROCHLOROTHIAZIDE 25 MG PO TABS
25.0000 mg | ORAL_TABLET | Freq: Every day | ORAL | 4 refills | Status: DC
  Filled 2020-10-14: qty 90, 90d supply, fill #0
  Filled 2021-01-29: qty 90, 90d supply, fill #1

## 2020-10-21 DIAGNOSIS — J4521 Mild intermittent asthma with (acute) exacerbation: Secondary | ICD-10-CM | POA: Diagnosis not present

## 2020-10-21 DIAGNOSIS — I10 Essential (primary) hypertension: Secondary | ICD-10-CM | POA: Diagnosis not present

## 2020-10-21 DIAGNOSIS — Z23 Encounter for immunization: Secondary | ICD-10-CM | POA: Diagnosis not present

## 2020-10-21 DIAGNOSIS — E785 Hyperlipidemia, unspecified: Secondary | ICD-10-CM | POA: Diagnosis not present

## 2020-11-30 DIAGNOSIS — H5203 Hypermetropia, bilateral: Secondary | ICD-10-CM | POA: Diagnosis not present

## 2021-01-30 ENCOUNTER — Other Ambulatory Visit (HOSPITAL_COMMUNITY): Payer: Self-pay

## 2021-02-06 ENCOUNTER — Other Ambulatory Visit (HOSPITAL_COMMUNITY): Payer: Self-pay

## 2021-03-05 ENCOUNTER — Ambulatory Visit (INDEPENDENT_AMBULATORY_CARE_PROVIDER_SITE_OTHER): Payer: No Typology Code available for payment source | Admitting: Family

## 2021-03-05 ENCOUNTER — Other Ambulatory Visit (HOSPITAL_COMMUNITY): Payer: Self-pay

## 2021-03-05 ENCOUNTER — Encounter: Payer: Self-pay | Admitting: Family

## 2021-03-05 ENCOUNTER — Ambulatory Visit: Payer: Self-pay

## 2021-03-05 ENCOUNTER — Other Ambulatory Visit: Payer: Self-pay

## 2021-03-05 DIAGNOSIS — M5441 Lumbago with sciatica, right side: Secondary | ICD-10-CM

## 2021-03-05 DIAGNOSIS — M5442 Lumbago with sciatica, left side: Secondary | ICD-10-CM

## 2021-03-05 MED ORDER — CYCLOBENZAPRINE HCL 10 MG PO TABS
10.0000 mg | ORAL_TABLET | Freq: Three times a day (TID) | ORAL | 0 refills | Status: DC | PRN
  Filled 2021-03-05: qty 30, 10d supply, fill #0

## 2021-03-05 MED ORDER — PREDNISONE 50 MG PO TABS
ORAL_TABLET | ORAL | 0 refills | Status: DC
  Filled 2021-03-05: qty 5, 5d supply, fill #0

## 2021-03-05 NOTE — Progress Notes (Signed)
Office Visit Note   Patient: Marissa Cox           Date of Birth: [birth year]           MRN: [id number] Visit Date: 03/05/2021              Requested by: Leeroy Cha, MD [address] [place],  [place] [id number] PCP: Leeroy Cha, MD  Chief Complaint  Patient presents with   Left Leg - Pain   Right Leg - Pain      HPI: The patient is a 61 year old woman who comes in today complaining of anterior thigh pain bilaterally this has been ongoing for 2 months now.  This started with low back pain and now has been radiating down her legs the left is worse than the right.  She has tried physical therapy without relief she complains of some grabbing and spasming pain in her back  No numbness no weakness no red flag symptoms  Assessment & Plan: Visit Diagnoses:  1. Acute bilateral low back pain with bilateral sciatica     Plan: We will place her on a prednisone burst have called in a prescription for Flexeril as well she will follow-up in the office as needed.  Follow-Up Instructions: No follow-ups on file.   Back Exam   Tenderness  The patient is experiencing tenderness in the lumbar.  Range of Motion  The patient has normal back ROM.  Muscle Strength  The patient has normal back strength.  Tests  Straight leg raise right: negative Straight leg raise left: positive  Other  Gait: normal      Patient is alert, oriented, no adenopathy, well-dressed, normal affect, normal respiratory effort.   Imaging: No results found. No images are attached to the encounter.  Labs: Lab Results  Component Value Date   REPTSTATUS 03/24/2013 FINAL 03/22/2013   CULT  03/22/2013    No Beta Hemolytic Streptococci Isolated Performed at [facility]     Lab Results  Component Value Date   ALBUMIN 3.6 04/14/2016   ALBUMIN 3.9 05/05/2012   ALBUMIN 3.3 (L) 12/23/2011    Lab Results  Component Value Date   MG 1.9 05/05/2012    No results found for: VD25OH  No results found for: PREALBUMIN CBC EXTENDED Latest Ref Rng & Units 04/14/2016 03/22/2013 05/05/2012  WBC 4.0 - 10.5 K/uL 7.0 - 4.4  RBC 3.87 - 5.11 MIL/uL 5.30(H) - 4.96  HGB 12.0 - 15.0 g/dL 12.3 14.3 11.1(L)  HCT 36.0 - 46.0 % 39.1 42.0 36.6  PLT 150 - 400 K/uL 260 - 259  NEUTROABS 1.7 - 7.7 K/uL - - 2.6  LYMPHSABS 0.7 - 4.0 K/uL - - 1.4     There is no height or weight on file to calculate BMI.  Orders:  Orders Placed This Encounter  Procedures   XR Lumbar Spine 2-3 Views   No orders of the defined types were placed in this encounter.    Procedures: No procedures performed  Clinical Data: No additional findings.  ROS:  All other systems negative, except as noted in the HPI. Review of Systems  Objective: Vital Signs: There were no vitals taken for this visit.  Specialty Comments:  No specialty comments available.  PMFS History: Patient Active Problem List   Diagnosis Date Noted   Adjustment disorder with anxiety 05/30/2020   Anxiety 05/30/2020   Acute exacerbation of chronic obstructive airways disease ([place]) 05/30/2020   Asthma attack 05/30/2020  Chronic obstructive pulmonary disease, unspecified ([place]) 05/30/2020   Body mass index (BMI) 40.0-44.9, adult ([place]) 05/30/2020   Chronic osteoarthritis 05/30/2020   Dyslipidemia 05/30/2020   Essential hypertension 05/30/2020   Iron deficiency anemia 05/30/2020   Low back pain 05/30/2020   Mild intermittent asthma 05/30/2020   Smokers' cough ([place]) 05/30/2020   Tobacco user 05/30/2020   Urinary tract infectious disease 05/30/2020   Morbid obesity ([place]) 08/16/2019   Lesion of adrenal gland ([place]) 08/16/2019   Plantar fasciitis 11/25/2018   Past Medical History:  Diagnosis Date   Allergy    Anemia    Arthritis    not diagnosed - in knees per pt    COPD (chronic obstructive pulmonary disease) ([place])    ? per PCP- ref to pulmonary    GERD (gastroesophageal reflux disease)     occ    Hyperlipidemia    Hypertension    Morbid obesity ([facility])    PONV (postoperative nausea and vomiting)     Family History  Problem Relation Age of Onset   Cancer Mother        bladder   Bladder Cancer Mother    Cancer Maternal Aunt        breast   Breast cancer Maternal Aunt    Cancer Paternal Aunt        breast   Breast cancer Paternal Aunt    Cancer Paternal Aunt        breast   Breast cancer Paternal Aunt    Cancer Paternal Aunt        breast   Cancer Maternal Aunt        ovarian   Breast cancer Maternal Aunt    Colon cancer Neg Hx    Colon polyps Neg Hx    Esophageal cancer Neg Hx    Rectal cancer Neg Hx    Stomach cancer Neg Hx     Past Surgical History:  Procedure Laterality Date   COLONOSCOPY     LAPAROSCOPIC GASTRIC SLEEVE RESECTION  12/22/2011   Procedure: LAPAROSCOPIC GASTRIC SLEEVE RESECTION;  Surgeon: Madilyn Hook, DO;  Location: [facility];  Service: General;  Laterality: N/A;   [place]   UPPER GASTROINTESTINAL ENDOSCOPY     UPPER GI ENDOSCOPY  12/22/2011   Procedure: UPPER GI ENDOSCOPY;  Surgeon: Madilyn Hook, DO;  Location: [facility];  Service: General;;   Social History   Occupational History   Not on file  Tobacco Use   Smoking status: Every Day    Packs/day: 0.50    Types: Cigarettes   Smokeless tobacco: Former    Quit date: 08/07/2011   Tobacco comments:    < 0.5 / pack a day per pt   Vaping Use   Vaping Use: Never used  Substance and Sexual Activity   Alcohol use: Yes    Comment: occ    Drug use: No   Sexual activity: Not on file

## 2021-03-06 ENCOUNTER — Other Ambulatory Visit (HOSPITAL_COMMUNITY): Payer: Self-pay

## 2021-03-07 ENCOUNTER — Other Ambulatory Visit (HOSPITAL_COMMUNITY): Payer: Self-pay

## 2021-03-17 ENCOUNTER — Other Ambulatory Visit (HOSPITAL_COMMUNITY): Payer: Self-pay

## 2021-03-18 ENCOUNTER — Other Ambulatory Visit (HOSPITAL_COMMUNITY): Payer: Self-pay

## 2021-05-07 ENCOUNTER — Other Ambulatory Visit (HOSPITAL_COMMUNITY): Payer: Self-pay

## 2021-05-07 MED ORDER — ATORVASTATIN CALCIUM 20 MG PO TABS
20.0000 mg | ORAL_TABLET | Freq: Every day | ORAL | 4 refills | Status: DC
  Filled 2021-05-07: qty 90, 90d supply, fill #0

## 2021-05-07 MED ORDER — LISINOPRIL-HYDROCHLOROTHIAZIDE 20-25 MG PO TABS
1.0000 | ORAL_TABLET | Freq: Every day | ORAL | 2 refills | Status: DC
  Filled 2021-05-07: qty 90, 90d supply, fill #0
  Filled 2021-08-01: qty 90, 90d supply, fill #1

## 2021-05-09 ENCOUNTER — Other Ambulatory Visit (HOSPITAL_COMMUNITY): Payer: Self-pay

## 2021-05-12 ENCOUNTER — Other Ambulatory Visit (HOSPITAL_COMMUNITY): Payer: Self-pay

## 2021-07-20 ENCOUNTER — Other Ambulatory Visit (HOSPITAL_COMMUNITY): Payer: Self-pay

## 2021-07-21 ENCOUNTER — Other Ambulatory Visit (HOSPITAL_COMMUNITY): Payer: Self-pay

## 2021-07-21 MED ORDER — MONTELUKAST SODIUM 10 MG PO TABS
10.0000 mg | ORAL_TABLET | Freq: Every day | ORAL | 3 refills | Status: DC
  Filled 2021-07-21: qty 30, 30d supply, fill #0

## 2021-07-22 ENCOUNTER — Other Ambulatory Visit (HOSPITAL_COMMUNITY): Payer: Self-pay

## 2021-07-23 ENCOUNTER — Other Ambulatory Visit (HOSPITAL_COMMUNITY): Payer: Self-pay

## 2021-08-01 ENCOUNTER — Other Ambulatory Visit (HOSPITAL_COMMUNITY): Payer: Self-pay

## 2021-08-01 ENCOUNTER — Telehealth: Payer: No Typology Code available for payment source | Admitting: Physician Assistant

## 2021-08-01 DIAGNOSIS — H66002 Acute suppurative otitis media without spontaneous rupture of ear drum, left ear: Secondary | ICD-10-CM | POA: Diagnosis not present

## 2021-08-01 MED ORDER — AMOXICILLIN-POT CLAVULANATE 875-125 MG PO TABS
1.0000 | ORAL_TABLET | Freq: Two times a day (BID) | ORAL | 0 refills | Status: DC
  Filled 2021-08-01 – 2021-10-16 (×2): qty 20, 10d supply, fill #0

## 2021-08-01 MED ORDER — NEOMYCIN-POLYMYXIN-HC 3.5-10000-1 OT SOLN
3.0000 [drp] | Freq: Four times a day (QID) | OTIC | 0 refills | Status: DC
  Filled 2021-08-01: qty 10, 16d supply, fill #0

## 2021-08-01 NOTE — Progress Notes (Signed)
E Visit for Acute Ear Infection  We are sorry that you are not feeling well. Here is how we plan to help!  I have prescribed: Neomycin 0.35%, polymyxin B 10,000 units/mL, and hydrocortisone 0,5% otic solution 4 drops in affected ears four times a day for 7 days  Based on what you have told me you may have a bacterial infection. In addition to the ear drops I have prescribed an oral antibiotic: and Augmentin 875-'125mg'$  one tablet by mouth twice a day for 10 days  If you have a fever 102 and up and significantly worsening symptoms, this could indicate a more serious infection moving to the middle/inner and needs face to face evaluation in an office by a provider.  Your symptoms should improve over the next 3 days and should resolve in about 7 days.  HOME CARE:  Wash your hands frequently. Do not place the tip of the bottle on your ear or touch it with your fingers. You can take Acetominophen 650 mg every 4-6 hours as needed for pain.  If pain is severe or moderate, you can apply a heating pad (set on low) or hot water bottle (wrapped in a towel) to outer ear for 20 minutes.  This will also increase drainage. Avoid ear plugs Do not use Q-tips After showers, help the water run out by tilting your head to one side.  GET HELP RIGHT AWAY IF:  Fever is over 102.2 degrees. You develop progressive ear pain or hearing loss. Ear symptoms persist longer than 3 days after treatment.  MAKE SURE YOU:  Understand these instructions. Will watch your condition. Will get help right away if you are not doing well or get worse.   Thank you for choosing an e-visit.  Your e-visit answers were reviewed by a board certified advanced clinical practitioner to complete your personal care plan. Depending upon the condition, your plan could have included both over the counter or prescription medications.  Please review your pharmacy choice. Make sure the pharmacy is open so you can pick up prescription now. If  there is a problem, you may contact your provider through [employer] and have the prescription routed to another pharmacy.  Your safety is important to [place]. If you have drug allergies check your prescription carefully.   For the next 24 hours you can use MyChart to ask questions about today's visit, request a non-urgent call back, or ask for a work or school excuse. You will get an email in the next two days asking about your experience. I hope that your e-visit has been valuable and will speed your recovery.   I provided 5 minutes of non face-to-face time during this encounter for chart review and documentation.

## 2021-08-04 ENCOUNTER — Other Ambulatory Visit (HOSPITAL_COMMUNITY): Payer: Self-pay

## 2021-08-07 ENCOUNTER — Other Ambulatory Visit (HOSPITAL_COMMUNITY): Payer: Self-pay

## 2021-08-08 ENCOUNTER — Other Ambulatory Visit (HOSPITAL_COMMUNITY): Payer: Self-pay

## 2021-08-12 ENCOUNTER — Other Ambulatory Visit (HOSPITAL_COMMUNITY): Payer: Self-pay

## 2021-08-12 MED ORDER — BREZTRI AEROSPHERE 160-9-4.8 MCG/ACT IN AERO
2.0000 | INHALATION_SPRAY | Freq: Two times a day (BID) | RESPIRATORY_TRACT | 2 refills | Status: DC
  Filled 2021-08-12: qty 10.7, 30d supply, fill #0
  Filled 2021-10-16: qty 32.1, 90d supply, fill #0

## 2021-08-20 ENCOUNTER — Other Ambulatory Visit (HOSPITAL_COMMUNITY): Payer: Self-pay

## 2021-10-02 ENCOUNTER — Other Ambulatory Visit (HOSPITAL_COMMUNITY): Payer: Self-pay

## 2021-10-12 ENCOUNTER — Encounter (HOSPITAL_COMMUNITY): Payer: Self-pay

## 2021-10-12 ENCOUNTER — Ambulatory Visit (HOSPITAL_COMMUNITY)
Admission: EM | Admit: 2021-10-12 | Discharge: 2021-10-12 | Disposition: A | Discharge status: Home / Self Care | Payer: No Typology Code available for payment source | Attending: Physician Assistant | Admitting: Physician Assistant

## 2021-10-12 DIAGNOSIS — R112 Nausea with vomiting, unspecified: Secondary | ICD-10-CM | POA: Diagnosis not present

## 2021-10-12 DIAGNOSIS — R1013 Epigastric pain: Secondary | ICD-10-CM

## 2021-10-12 MED ORDER — ONDANSETRON 4 MG PO TBDP
4.0000 mg | ORAL_TABLET | Freq: Once | ORAL | Status: AC
  Administered 2021-10-12: 4 mg via ORAL

## 2021-10-12 MED ORDER — ONDANSETRON 4 MG PO TBDP: 4.0000 mg | ORAL_TABLET | Freq: Three times a day (TID) | ORAL | 0 refills | Status: DC | PRN

## 2021-10-12 MED ORDER — ONDANSETRON 4 MG PO TBDP
ORAL_TABLET | ORAL | Status: AC
  Filled 2021-10-12: qty 1

## 2021-10-12 NOTE — Discharge Instructions (Addendum)
Advised to continue taking Zofran every 6 hours on a regular basis to help reduce the abdominal pain and cramping along with controlling the nausea. Advised to continue clear liquids, when you get a little bit hungry you can break into a bland diet such as toast, crackers, chicken broth and chicken noodle soup. Advised to follow-up with PCP or return to urgent care if symptoms fail to improve.

## 2021-10-12 NOTE — ED Triage Notes (Signed)
Onset 2-3 days of vomiting and feeling week.

## 2021-10-12 NOTE — ED Provider Notes (Signed)
[place]    CSN: [id number] Arrival date & time: 10/12/21  1355      History   Chief Complaint Chief Complaint  Patient presents with   Emesis    HPI Marissa Cox is a 61 y.o. female.   61 year old female presents with nausea and vomiting.  Patient indicates since Friday she started having abdominal pain which she indicates is in the epigastric area.  She relates that it has been steady but she also has pain across the abdomen, she started with nausea, stomach cramping and has been throwing up intermittently ever since Friday.  Patient relates she has not have any diarrhea.  She denies any fever or chills.  She has not been around any family or friends that have been sick with similar symptoms.  She has not eaten any foods that she believes did not taste right or had questions about.  She indicates she did have some Zofran leftover from the illness she had some time ago and she took a couple of those Zofran which will help to relieve her symptoms.  Patient indicates she is not able to eat but she is able to tolerate fluids well.   Emesis Associated symptoms: abdominal pain     Past Medical History:  Diagnosis Date   Allergy    Anemia    Arthritis    not diagnosed - in knees per pt    COPD (chronic obstructive pulmonary disease) ([place])    ? per PCP- ref to pulmonary    GERD (gastroesophageal reflux disease)    occ    Hyperlipidemia    Hypertension    Morbid obesity ([place])    PONV (postoperative nausea and vomiting)     Patient Active Problem List   Diagnosis Date Noted   Adjustment disorder with anxiety 05/30/2020   Anxiety 05/30/2020   Acute exacerbation of chronic obstructive airways disease ([place]) 05/30/2020   Asthma attack 05/30/2020   Chronic obstructive pulmonary disease, unspecified ([place]) 05/30/2020   Body mass index (BMI) 40.0-44.9, adult ([place]) 05/30/2020   Chronic osteoarthritis 05/30/2020   Dyslipidemia 05/30/2020   Essential  hypertension 05/30/2020   Iron deficiency anemia 05/30/2020   Low back pain 05/30/2020   Mild intermittent asthma 05/30/2020   Smokers' cough ([place]) 05/30/2020   Tobacco user 05/30/2020   Urinary tract infectious disease 05/30/2020   Morbid obesity ([place]) 08/16/2019   Lesion of adrenal gland ([place]) 08/16/2019   Plantar fasciitis 11/25/2018    Past Surgical History:  Procedure Laterality Date   COLONOSCOPY     LAPAROSCOPIC GASTRIC SLEEVE RESECTION  12/22/2011   Procedure: LAPAROSCOPIC GASTRIC SLEEVE RESECTION;  Surgeon: Madilyn Hook, DO;  Location: [facility];  Service: General;  Laterality: N/A;   [place] ENDOSCOPY     UPPER GI ENDOSCOPY  12/22/2011   Procedure: UPPER GI ENDOSCOPY;  Surgeon: Madilyn Hook, DO;  Location: [facility];  Service: General;;    OB History     Gravida  3   Para  3   Term  3   Preterm  0   AB  0   Living  2      SAB  0   IAB  0   Ectopic  0   Multiple  0   Live Births  3            Home Medications  Prior to Admission medications   Medication Sig Start Date End Date Taking? Authorizing Provider  ondansetron (ZOFRAN-ODT) 4 MG disintegrating tablet Take 1 tablet (4 mg total) by mouth every 8 (eight) hours as needed for nausea or vomiting. 10/12/21  Yes Nyoka Lint, PA-C  albuterol (PROVENTIL) (2.5 MG/3ML) 0.083% nebulizer solution Take 3 mLs (1 vial) by nebulization every 6 (six) hours as needed. 10/14/20     albuterol (VENTOLIN HFA) 108 (90 Base) MCG/ACT inhaler Inhale 1 puff into the lungs every 4 (four) hours. 05/06/20     albuterol (VENTOLIN HFA) 108 (90 Base) MCG/ACT inhaler Inhale 1 puff into the lungs every 4 (four) hours as needed. 10/14/20     amoxicillin-clavulanate (AUGMENTIN) 875-125 MG tablet Take 1 tablet by mouth 2 (two) times daily. 08/01/21   Mar Daring, PA-C  atorvastatin (LIPITOR) 20 MG tablet Take 1  tablet (20 mg total) by mouth daily. 10/14/20     atorvastatin (LIPITOR) 20 MG tablet Take 1 tablet (20 mg total) by mouth daily. 05/07/21     BIOTIN PO Take by mouth daily.    [provider]  Budeson-Glycopyrrol-Formoterol (BREZTRI AEROSPHERE) 160-9-4.8 MCG/ACT AERO Take 2 puffs by mouth 2 (two) times daily. 05/06/20     Budeson-Glycopyrrol-Formoterol (BREZTRI AEROSPHERE) 160-9-4.8 MCG/ACT AERO Inhale 2 puffs into the lungs 2 (two) times daily. 08/12/21     Coenzyme Q10 (CO Q 10 PO) Take by mouth.    [provider]  COVID-19 At Home Antigen Test [facility] COVID-19 HOME TEST) KIT Use as directed 09/10/20   Jefm Bryant, RPH  cyclobenzaprine (FLEXERIL) 10 MG tablet Take 1 tablet (10 mg total) by mouth 3 (three) times daily as needed for muscle spasms. 03/05/21   Suzan Slick, NP  ferrous sulfate 325 (65 FE) MG EC tablet Take 325 mg by mouth 3 (three) times daily with meals.    [provider]  lisinopril-hydrochlorothiazide (ZESTORETIC) 20-25 MG tablet Take 1 tablet by mouth daily. 05/07/21     montelukast (SINGULAIR) 10 MG tablet Take 1 tablet (10 mg total) by mouth daily. 10/14/20     montelukast (SINGULAIR) 10 MG tablet Take 1 tablet (10 mg total) by mouth daily. 07/21/21     Multiple Vitamins-Minerals (MULTIVITAMIN WITH MINERALS) tablet Take 1 tablet by mouth 2 (two) times daily.     [provider]  neomycin-polymyxin-hydrocortisone (CORTISPORIN) OTIC solution Place 3 drops into the left ear 4 (four) times daily for 7 days 08/01/21   Mar Daring, PA-C  nystatin cream (MYCOSTATIN) Apply 1 application topically 2 (two) times daily. 08/14/20   Darlina Rumpf, CNM  predniSONE (DELTASONE) 50 MG tablet Take one tablet by mouth once daily for 5 days. 03/05/21   Suzan Slick, NP  varenicline (CHANTIX) 1 MG tablet Take 1 tablet (1 mg total) by mouth 2 (two) times daily. Patient not taking: No sig reported 05/06/20     hydrochlorothiazide (HYDRODIURIL) 25 MG  tablet Take 1 tablet (25 mg total) by mouth daily. 10/14/20 08/01/21    lisinopril (ZESTRIL) 10 MG tablet Take 1 tablet (10 mg total) by mouth daily. 10/14/20 08/01/21      Family History Family History  Problem Relation Age of Onset   Cancer Mother        bladder   Bladder Cancer Mother    Cancer Maternal Aunt        breast   Breast cancer Maternal Aunt    Cancer Paternal Aunt  breast   Breast cancer Paternal Aunt    Cancer Paternal Aunt        breast   Breast cancer Paternal Aunt    Cancer Paternal Aunt        breast   Cancer Maternal Aunt        ovarian   Breast cancer Maternal Aunt    Colon cancer Neg Hx    Colon polyps Neg Hx    Esophageal cancer Neg Hx    Rectal cancer Neg Hx    Stomach cancer Neg Hx     Social History Social History   Tobacco Use   Smoking status: Every Day    Packs/day: 0.50    Types: Cigarettes   Smokeless tobacco: Former    Quit date: 08/07/2011   Tobacco comments:    < 0.5 / pack a day per pt   Vaping Use   Vaping Use: Never used  Substance Use Topics   Alcohol use: Yes    Comment: occ    Drug use: No     Allergies   Other and Tramadol hcl   Review of Systems Review of Systems  Gastrointestinal:  Positive for abdominal pain and vomiting.     Physical Exam Triage Vital Signs ED Triage Vitals [10/12/21 1431]  Enc Vitals Group     BP (!) 154/84     Pulse Rate 72     Resp 18     Temp 98.6 F (37 C)     Temp Source Oral     SpO2 98 %     Weight      Height      Head Circumference      Peak Flow      Pain Score      Pain Loc      Pain Edu?      Excl. in [place]?    No data found.  Updated Vital Signs BP (!) 154/84 (BP Location: Left Arm)   Pulse 72   Temp 98.6 F (37 C) (Oral)   Resp 18   SpO2 98%   Visual Acuity Right Eye Distance:   Left Eye Distance:   Bilateral Distance:    Right Eye Near:   Left Eye Near:    Bilateral Near:     Physical Exam Constitutional:      Appearance: Normal appearance.   Cardiovascular:     Rate and Rhythm: Normal rate and regular rhythm.     Heart sounds: Normal heart sounds.  Abdominal:     General: Abdomen is flat. Bowel sounds are normal.     Palpations: Abdomen is soft.     Tenderness: There is abdominal tenderness in the epigastric area. There is no guarding.  Neurological:     Mental Status: She is alert.      UC Treatments / Results  Labs (all labs ordered are listed, but only abnormal results are displayed) Labs Reviewed - No data to display  EKG   Radiology No results found.  Procedures Procedures (including critical care time)  Medications Ordered in UC Medications  ondansetron (ZOFRAN-ODT) disintegrating tablet 4 mg (4 mg Oral Given 10/12/21 1506)    Initial Impression / Assessment and Plan / UC Course  I have reviewed the triage vital signs and the nursing notes.  Pertinent labs & imaging results that were available during my care of the patient were reviewed by me and considered in my medical decision making (see chart for details).  Plan: 1.  Advised take Zofran every 6 hours to help control the nausea and vomiting. 2.  Advised to continue with clear liquid diet and work into a bland diet over the next 24 to 48 hours. 3.  Advised to follow-up PCP or return to urgent care if symptoms fail to improve. Final Clinical Impressions(s) / UC Diagnoses   Final diagnoses:  Epigastric pain  Nausea and vomiting, unspecified vomiting type     Discharge Instructions      Advised to continue taking Zofran every 6 hours on a regular basis to help reduce the abdominal pain and cramping along with controlling the nausea. Advised to continue clear liquids, when you get a little bit hungry you can break into a bland diet such as toast, crackers, chicken broth and chicken noodle soup. Advised to follow-up with PCP or return to urgent care if symptoms fail to improve.    ED Prescriptions     Medication Sig Dispense Auth.  Provider   ondansetron (ZOFRAN-ODT) 4 MG disintegrating tablet Take 1 tablet (4 mg total) by mouth every 8 (eight) hours as needed for nausea or vomiting. 20 tablet Nyoka Lint, PA-C      PDMP not reviewed this encounter.   Nyoka Lint, PA-C 10/12/21 [phone]

## 2021-10-16 ENCOUNTER — Other Ambulatory Visit (HOSPITAL_COMMUNITY): Payer: Self-pay

## 2021-10-17 ENCOUNTER — Other Ambulatory Visit (HOSPITAL_COMMUNITY): Payer: Self-pay

## 2021-10-21 ENCOUNTER — Other Ambulatory Visit (HOSPITAL_COMMUNITY): Payer: Self-pay

## 2021-10-21 ENCOUNTER — Other Ambulatory Visit: Payer: Self-pay | Admitting: Internal Medicine

## 2021-10-21 DIAGNOSIS — Z1231 Encounter for screening mammogram for malignant neoplasm of breast: Secondary | ICD-10-CM

## 2021-10-21 MED ORDER — ATORVASTATIN CALCIUM 20 MG PO TABS
20.0000 mg | ORAL_TABLET | Freq: Every day | ORAL | 4 refills | Status: DC
  Filled 2021-10-21: qty 90, 90d supply, fill #0
  Filled 2022-01-26: qty 90, 90d supply, fill #1
  Filled 2022-05-08: qty 90, 90d supply, fill #2

## 2021-10-21 MED ORDER — BREZTRI AEROSPHERE 160-9-4.8 MCG/ACT IN AERO
2.0000 | INHALATION_SPRAY | Freq: Two times a day (BID) | RESPIRATORY_TRACT | 4 refills | Status: DC
  Filled 2021-10-21 – 2022-04-10 (×5): qty 32.1, 90d supply, fill #0
  Filled 2022-08-19: qty 32.1, 90d supply, fill #1

## 2021-10-21 MED ORDER — LISINOPRIL-HYDROCHLOROTHIAZIDE 20-25 MG PO TABS
1.0000 | ORAL_TABLET | Freq: Every day | ORAL | 4 refills | Status: DC
  Filled 2021-10-21: qty 90, 90d supply, fill #0
  Filled 2022-04-23: qty 90, 90d supply, fill #1

## 2021-10-25 ENCOUNTER — Emergency Department (HOSPITAL_COMMUNITY)
Admission: EM | Admit: 2021-10-25 | Discharge: 2021-10-25 | Disposition: A | Discharge status: Home / Self Care | Payer: No Typology Code available for payment source | Attending: Emergency Medicine | Admitting: Emergency Medicine

## 2021-10-25 ENCOUNTER — Emergency Department (HOSPITAL_COMMUNITY): Payer: No Typology Code available for payment source

## 2021-10-25 ENCOUNTER — Other Ambulatory Visit: Payer: Self-pay

## 2021-10-25 DIAGNOSIS — J449 Chronic obstructive pulmonary disease, unspecified: Secondary | ICD-10-CM | POA: Diagnosis not present

## 2021-10-25 DIAGNOSIS — F17210 Nicotine dependence, cigarettes, uncomplicated: Secondary | ICD-10-CM | POA: Diagnosis not present

## 2021-10-25 DIAGNOSIS — R001 Bradycardia, unspecified: Secondary | ICD-10-CM | POA: Diagnosis not present

## 2021-10-25 DIAGNOSIS — Z7952 Long term (current) use of systemic steroids: Secondary | ICD-10-CM | POA: Insufficient documentation

## 2021-10-25 DIAGNOSIS — K858 Other acute pancreatitis without necrosis or infection: Secondary | ICD-10-CM | POA: Insufficient documentation

## 2021-10-25 DIAGNOSIS — Z7951 Long term (current) use of inhaled steroids: Secondary | ICD-10-CM | POA: Insufficient documentation

## 2021-10-25 DIAGNOSIS — R1013 Epigastric pain: Secondary | ICD-10-CM | POA: Diagnosis present

## 2021-10-25 LAB — CBC WITH DIFFERENTIAL/PLATELET
Abs Immature Granulocytes: 0.02 10*3/uL (ref 0.00–0.07)
Basophils Absolute: 0 10*3/uL (ref 0.0–0.1)
Basophils Relative: 1 %
Eosinophils Absolute: 0.1 10*3/uL (ref 0.0–0.5)
Eosinophils Relative: 2 %
HCT: 37.5 % (ref 36.0–46.0)
Hemoglobin: 11.3 g/dL — ABNORMAL LOW (ref 12.0–15.0)
Immature Granulocytes: 0 %
Lymphocytes Relative: 26 %
Lymphs Abs: 1.5 10*3/uL (ref 0.7–4.0)
MCH: 23.1 pg — ABNORMAL LOW (ref 26.0–34.0)
MCHC: 30.1 g/dL (ref 30.0–36.0)
MCV: 76.5 fL — ABNORMAL LOW (ref 80.0–100.0)
Monocytes Absolute: 0.4 10*3/uL (ref 0.1–1.0)
Monocytes Relative: 8 %
Neutro Abs: 3.6 10*3/uL (ref 1.7–7.7)
Neutrophils Relative %: 63 %
Platelets: 279 10*3/uL (ref 150–400)
RBC: 4.9 MIL/uL (ref 3.87–5.11)
RDW: 13.4 % (ref 11.5–15.5)
WBC: 5.6 10*3/uL (ref 4.0–10.5)
nRBC: 0 % (ref 0.0–0.2)

## 2021-10-25 LAB — COMPREHENSIVE METABOLIC PANEL
ALT: 17 U/L (ref 0–44)
AST: 22 U/L (ref 15–41)
Albumin: 3.6 g/dL (ref 3.5–5.0)
Alkaline Phosphatase: 71 U/L (ref 38–126)
Anion gap: 10 (ref 5–15)
BUN: 14 mg/dL (ref 8–23)
CO2: 27 mmol/L (ref 22–32)
Calcium: 9.8 mg/dL (ref 8.9–10.3)
Chloride: 101 mmol/L (ref 98–111)
Creatinine, Ser: 1.12 mg/dL — ABNORMAL HIGH (ref 0.44–1.00)
GFR, Estimated: 56 mL/min — ABNORMAL LOW (ref >60)
Glucose, Bld: 103 mg/dL — ABNORMAL HIGH (ref 70–99)
Potassium: 3.4 mmol/L — ABNORMAL LOW (ref 3.5–5.1)
Sodium: 138 mmol/L (ref 135–145)
Total Bilirubin: 0.3 mg/dL (ref 0.3–1.2)
Total Protein: 7.3 g/dL (ref 6.5–8.1)

## 2021-10-25 LAB — LIPASE, BLOOD: Lipase: 264 U/L — ABNORMAL HIGH (ref 11–51)

## 2021-10-25 MED ORDER — ONDANSETRON HCL 4 MG PO TABS: 4.0000 mg | ORAL_TABLET | Freq: Four times a day (QID) | ORAL | 0 refills | Status: DC

## 2021-10-25 MED ORDER — LACTATED RINGERS IV BOLUS
1000.0000 mL | Freq: Once | INTRAVENOUS | Status: AC
  Administered 2021-10-25: 1000 mL via INTRAVENOUS

## 2021-10-25 MED ORDER — OXYCODONE-ACETAMINOPHEN 5-325 MG PO TABS: 1.0000 | ORAL_TABLET | Freq: Four times a day (QID) | ORAL | 0 refills | Status: DC | PRN

## 2021-10-25 MED ORDER — IOHEXOL 350 MG/ML SOLN
80.0000 mL | Freq: Once | INTRAVENOUS | Status: AC | PRN
  Administered 2021-10-25: 80 mL via INTRAVENOUS

## 2021-10-25 MED ORDER — MORPHINE SULFATE (PF) 4 MG/ML IV SOLN
4.0000 mg | Freq: Once | INTRAVENOUS | Status: AC
  Administered 2021-10-25: 4 mg via INTRAVENOUS
  Filled 2021-10-25: qty 1

## 2021-10-25 NOTE — ED Provider Notes (Signed)
[facility] EMERGENCY DEPARTMENT Provider Note   CSN: [id number] Arrival date & time: 10/25/21  1314     History  Chief Complaint  Patient presents with   Abdominal Pain   Emesis    Vanecia Limpert is a 61 y.o. female with history of obesity, COPD, dyslipidemia and status post gastric sleeve resection in 2013 presents to the emergency department today for evaluation of epigastric abdominal pain that started approximately 2 weeks ago.  Patient went to urgent care at symptom onset and was sent home with Zofran to help with associated nausea symptoms.  She states that while this has occasionally helped her nausea, her primary complaint is the pain which has not subsided in the last 2 weeks.  Pain is constant and occasionally radiates to the right upper quadrant.  No previous history of cholecystitis or pancreatitis.  She states that she drinks occasionally "socially" although admits to a high fat diet.  She contacted her PCP for symptoms and they referred her to a GI doctor, but she is unable to get an appointment until the end of October.  She states that she was unable to wait that long for her appointment due to pain.  She denies headaches, chest pain, shortness of breath, vomiting, diarrhea.   Abdominal Pain Associated symptoms: nausea   Associated symptoms: no chest pain, no diarrhea, no fever and no vomiting   Emesis Associated symptoms: abdominal pain   Associated symptoms: no diarrhea and no fever        Home Medications Prior to Admission medications   Medication Sig Start Date End Date Taking? Authorizing Provider  ondansetron (ZOFRAN) 4 MG tablet Take 1 tablet (4 mg total) by mouth every 6 (six) hours. 10/25/21  Yes Tonye Pearson, PA-C  oxyCODONE-acetaminophen (PERCOCET/ROXICET) 5-325 MG tablet Take 1 tablet by mouth every 6 (six) hours as needed for up to 5 days for severe pain. 10/25/21 10/30/21 Yes Kathe Becton R, PA-C  albuterol (PROVENTIL)  (2.5 MG/3ML) 0.083% nebulizer solution Take 3 mLs (1 vial) by nebulization every 6 (six) hours as needed. 10/14/20     albuterol (VENTOLIN HFA) 108 (90 Base) MCG/ACT inhaler Inhale 1 puff into the lungs every 4 (four) hours. 05/06/20     albuterol (VENTOLIN HFA) 108 (90 Base) MCG/ACT inhaler Inhale 1 puff into the lungs every 4 (four) hours as needed. 10/14/20     amoxicillin-clavulanate (AUGMENTIN) 875-125 MG tablet Take 1 tablet by mouth 2 (two) times daily. 08/01/21   Mar Daring, PA-C  atorvastatin (LIPITOR) 20 MG tablet Take 1 tablet (20 mg total) by mouth daily. 10/14/20     atorvastatin (LIPITOR) 20 MG tablet Take 1 tablet (20 mg total) by mouth daily. 05/07/21     atorvastatin (LIPITOR) 20 MG tablet Take 1 tablet (20 mg total) by mouth daily. 10/21/21     BIOTIN PO Take by mouth daily.    [provider]  Budeson-Glycopyrrol-Formoterol (BREZTRI AEROSPHERE) 160-9-4.8 MCG/ACT AERO Take 2 puffs by mouth 2 (two) times daily. 05/06/20     Budeson-Glycopyrrol-Formoterol (BREZTRI AEROSPHERE) 160-9-4.8 MCG/ACT AERO Inhale 2 puffs into the lungs 2 (two) times daily. 08/12/21     Budeson-Glycopyrrol-Formoterol (BREZTRI AEROSPHERE) 160-9-4.8 MCG/ACT AERO Inhale 2 puffs into the lungs 2 (two) times daily. 10/21/21     Coenzyme Q10 (CO Q 10 PO) Take by mouth.    [provider]  COVID-19 At Home Antigen Test [facility] COVID-19 HOME TEST) KIT Use as directed 09/10/20   Donnelly Stager  R, RPH  cyclobenzaprine (FLEXERIL) 10 MG tablet Take 1 tablet (10 mg total) by mouth 3 (three) times daily as needed for muscle spasms. 03/05/21   Adonis Huguenin, NP  ferrous sulfate 325 (65 FE) MG EC tablet Take 325 mg by mouth 3 (three) times daily with meals.    [provider]  lisinopril-hydrochlorothiazide (ZESTORETIC) 20-25 MG tablet Take 1 tablet by mouth daily. 05/07/21     lisinopril-hydrochlorothiazide (ZESTORETIC) 20-25 MG tablet Take 1 tablet by mouth daily. 10/21/21     montelukast  (SINGULAIR) 10 MG tablet Take 1 tablet (10 mg total) by mouth daily. 10/14/20     montelukast (SINGULAIR) 10 MG tablet Take 1 tablet (10 mg total) by mouth daily. 07/21/21     Multiple Vitamins-Minerals (MULTIVITAMIN WITH MINERALS) tablet Take 1 tablet by mouth 2 (two) times daily.     [provider]  neomycin-polymyxin-hydrocortisone (CORTISPORIN) OTIC solution Place 3 drops into the left ear 4 (four) times daily for 7 days 08/01/21   Margaretann Loveless, PA-C  nystatin cream (MYCOSTATIN) Apply 1 application topically 2 (two) times daily. 08/14/20   Calvert Cantor, CNM  ondansetron (ZOFRAN-ODT) 4 MG disintegrating tablet Take 1 tablet (4 mg total) by mouth every 8 (eight) hours as needed for nausea or vomiting. 10/12/21   Ellsworth Lennox, PA-C  predniSONE (DELTASONE) 50 MG tablet Take one tablet by mouth once daily for 5 days. 03/05/21   Adonis Huguenin, NP  varenicline (CHANTIX) 1 MG tablet Take 1 tablet (1 mg total) by mouth 2 (two) times daily. Patient not taking: No sig reported 05/06/20     hydrochlorothiazide (HYDRODIURIL) 25 MG tablet Take 1 tablet (25 mg total) by mouth daily. 10/14/20 08/01/21    lisinopril (ZESTRIL) 10 MG tablet Take 1 tablet (10 mg total) by mouth daily. 10/14/20 08/01/21        Allergies    Other and Tramadol hcl    Review of Systems   Review of Systems  Constitutional:  Negative for fever.  Cardiovascular:  Negative for chest pain.  Gastrointestinal:  Positive for abdominal pain and nausea. Negative for diarrhea and vomiting.    Physical Exam Updated Vital Signs BP 114/76 (BP Location: Left Arm)   Pulse (!) 50   Temp 98.4 F (36.9 C) (Oral)   Resp 16   Ht 5\' 7"  (1.702 m)   Wt 110.2 kg   SpO2 100%   BMI 38.06 kg/m  Physical Exam Vitals and nursing note reviewed.  Constitutional:      General: She is not in acute distress.    Appearance: She is not ill-appearing.  HENT:     Head: Atraumatic.  Eyes:     Conjunctiva/sclera: Conjunctivae normal.   Cardiovascular:     Rate and Rhythm: Normal rate and regular rhythm.     Pulses: Normal pulses.     Heart sounds: No murmur heard. Pulmonary:     Effort: Pulmonary effort is normal. No respiratory distress.     Breath sounds: Normal breath sounds.  Abdominal:     General: Abdomen is flat. There is no distension.     Palpations: Abdomen is soft.     Tenderness: There is abdominal tenderness in the epigastric area. There is no right CVA tenderness or left CVA tenderness. Negative signs include Murphy's sign and McBurney's sign.  Musculoskeletal:        General: Normal range of motion.     Cervical back: Normal range of motion.  Skin:  General: Skin is warm and dry.     Capillary Refill: Capillary refill takes less than 2 seconds.  Neurological:     General: No focal deficit present.     Mental Status: She is alert.  Psychiatric:        Mood and Affect: Mood normal.     ED Results / Procedures / Treatments   Labs (all labs ordered are listed, but only abnormal results are displayed) Labs Reviewed  CBC WITH DIFFERENTIAL/PLATELET - Abnormal; Notable for the following components:      Result Value   Hemoglobin 11.3 (*)    MCV 76.5 (*)    MCH 23.1 (*)    All other components within normal limits  COMPREHENSIVE METABOLIC PANEL - Abnormal; Notable for the following components:   Potassium 3.4 (*)    Glucose, Bld 103 (*)    Creatinine, Ser 1.12 (*)    GFR, Estimated 56 (*)    All other components within normal limits  LIPASE, BLOOD - Abnormal; Notable for the following components:   Lipase 264 (*)    All other components within normal limits  URINALYSIS, ROUTINE W REFLEX MICROSCOPIC    EKG None  Radiology CT ABDOMEN PELVIS W CONTRAST  Result Date: 10/25/2021 CLINICAL DATA:  Diffuse abdominal pain. Nausea vomiting after eating. Symptoms for 3 weeks. EXAM: CT ABDOMEN AND PELVIS WITH CONTRAST TECHNIQUE: Multidetector CT imaging of the abdomen and pelvis was performed using  the standard protocol following bolus administration of intravenous contrast. RADIATION DOSE REDUCTION: This exam was performed according to the departmental dose-optimization program which includes automated exposure control, adjustment of the mA and/or kV according to patient size and/or use of iterative reconstruction technique. CONTRAST:  80mL OMNIPAQUE IOHEXOL 350 MG/ML SOLN COMPARISON:  06/18/2016. FINDINGS: Lower chest: Clear lung bases. Hepatobiliary: No focal liver abnormality is seen. No gallstones, gallbladder wall thickening, or biliary dilatation. Pancreas: Subtle inflammatory type haziness adjacent to the pancreatic head and second and third portion of the duodenum. No pancreatic mass or other inflammatory change. No duct dilation. Spleen: Normal in size without focal abnormality. Adrenals/Urinary Tract: 1.4 cm left adrenal nodule, relative washout over 40%, consistent with an adenoma, stable from the prior CT. No follow-up indicated. Normal right adrenal gland. Kidneys normal in size, orientation and position with symmetric enhancement and excretion. Subcentimeter low-attenuation lesion, posterior mid to upper pole the left kidney, too small to characterize, consistent with a cyst. No follow-up recommended. No other renal masses or lesions. No stones. No hydronephrosis. Normal ureters. Normal bladder. Stomach/Bowel: Previous gastric stapling, stable. No stomach wall thickening or inflammation. Small bowel and colon are normal in caliber. No wall thickening. No inflammation. Scattered left colon diverticula. No evidence of appendicitis. Vascular/Lymphatic: Aortic atherosclerosis. No aneurysm. No enlarged lymph nodes. Reproductive: Status post hysterectomy. No adnexal masses. Other: No abdominal wall hernia or abnormality. No abdominopelvic ascites. Musculoskeletal: No fracture or acute finding.  No bone lesion. IMPRESSION: 1. Subtle inflammatory type changes surrounds the pancreatic head. Suspect mild  uncomplicated pancreatitis. Findings could be from adjacent to 1 notice, felt less likely. 2. No other evidence of an acute abnormality within the abdomen or pelvis. 3. Aortic atherosclerosis. Electronically Signed   By: Lajean Manes M.D.   On: 10/25/2021 16:46    Procedures Procedures    Medications Ordered in ED Medications  morphine (PF) 4 MG/ML injection 4 mg (has no administration in time range)  iohexol (OMNIPAQUE) 350 MG/ML injection 80 mL (80 mLs Intravenous Contrast Given 10/25/21 1634)  lactated ringers bolus 1,000 mL (0 mLs Intravenous Stopped 10/25/21 2032)  morphine (PF) 4 MG/ML injection 4 mg (4 mg Intravenous Given 10/25/21 1851)    ED Course/ Medical Decision Making/ A&P                           Medical Decision Making Risk Prescription drug management.   Social determinants of health:  Social History   Socioeconomic History   Marital status: Widowed    Spouse name: Not on file   Number of children: Not on file   Years of education: Not on file   Highest education level: Not on file  Occupational History   Not on file  Tobacco Use   Smoking status: Every Day    Packs/day: 0.50    Types: Cigarettes   Smokeless tobacco: Former    Quit date: 08/07/2011   Tobacco comments:    < 0.5 / pack a day per pt   Vaping Use   Vaping Use: Never used  Substance and Sexual Activity   Alcohol use: Yes    Comment: occ    Drug use: No   Sexual activity: Not on file  Other Topics Concern   Not on file  Social History Narrative   Not on file   Social Determinants of Health   Financial Resource Strain: Not on file  Food Insecurity: No Food Insecurity (08/14/2020)   Hunger Vital Sign    Worried About Running Out of Food in the Last Year: Never true    Ran Out of Food in the Last Year: Never true  Transportation Needs: No Transportation Needs (08/14/2020)   PRAPARE - Hydrologist (Medical): No    Lack of Transportation (Non-Medical): No   Physical Activity: Not on file  Stress: Not on file  Social Connections: Not on file  Intimate Partner Violence: Not on file     Initial impression:  This patient presents to the ED for concern of epigastric abdominal pain x2 weeks, this involves an extensive number of treatment options, and is a complaint that carries with it a high risk of complications and morbidity.   Differentials include pancreatitis, gastritis, PUD, AAA, cholecystitis, cholelithiasis, nephrolithiasis.   Comorbidities affecting care:  COPD  Additional history obtained: Urgent care record  Lab Tests  I Ordered, reviewed, and interpreted labs and EKG.  The pertinent results include:  Lipase: 264 CMP: Creatinine 1.12, near baseline CBC unremarkable  Imaging Studies ordered:  I ordered imaging studies including  CT abdomen pelvis with mild uncomplicated pancreatitis I independently visualized and interpreted imaging and I agree with the radiologist interpretation.   EKG: Sinus bradycardia   Medicines ordered and prescription drug management:  I ordered medication including: 1 L fluids Morphine 4 mg Reevaluation of the patient after these medicines showed that the patient improved I have reviewed the patients home medicines and have made adjustments as needed  ED Course/Re-evaluation: Patient is in no acute distress and is nontoxic-appearing.  Vitals without significant abnormality.  On exam, she has epigastric tenderness to palpation.  Abdomen is otherwise soft, nondistended without guarding or peritoneal signs.  Labs concerning for lipase 264.  I ordered CT abdomen and pelvis which identified mild uncomplicated pancreatitis.  She was given 1 L fluids along with morphine with significant improvement in pain.  I discussed with patient option of admission for pain control versus discharge home with clear liquid diet, pain medicines and  GI referral/follow-up.  Patient states that she would prefer to go  home rather than be admitted.  Given the mild nature of her symptoms, I think that this is a reasonable option.  She was given additional pain medications prior to discharge.  Ambulatory referral to GI placed.  Return precautions were discussed.  Patient amenable to plan. Discussed case with my attending, Dr. Regenia Skeeter who agrees with management.  Disposition:  After consideration of the diagnostic results, physical exam, history and the patients response to treatment feel that the patent would benefit from discharge.   Acute pancreatitis: Plan and management as described above. Discharged home in good condition. Final Clinical Impression(s) / ED Diagnoses Final diagnoses:  Other acute pancreatitis without infection or necrosis    Rx / DC Orders ED Discharge Orders          Ordered    Ambulatory referral to Gastroenterology       Comments: Acute uncomplicated pancreatitis. Dc'd home with clear fluids and po pain meds   10/25/21 1920    ondansetron (ZOFRAN) 4 MG tablet  Every 6 hours        10/25/21 2051    oxyCODONE-acetaminophen (PERCOCET/ROXICET) 5-325 MG tablet  Every 6 hours PRN        10/25/21 2051              Rodena Piety 10/25/21 2054    Sherwood Gambler, MD 10/29/21 [phone]

## 2021-10-25 NOTE — ED Notes (Signed)
Patient transported to CT 

## 2021-10-25 NOTE — Discharge Instructions (Addendum)
You were diagnosed with acute pancreatitis.  This is an condition which your pancreas has become either infected or inflamed which can cause pain in your upper abdomen.  Most often, we admit patients for this condition, however yours is quite mild and is responding well to pain medication, so we will try discharging home to see if we can manage as outpatient.  At home, you must adhere to a strict clear liquid diet until you are cleared by either GI or by your primary care doctor.  You can take pain medication at home that I have prescribed along with Zofran for nausea.  I have put in a referral to a GI doctor you should hopefully hear from them by Monday to establish follow-up.  If you do not hear from them by Tuesday, please call their office directly.  Their contact information is on your discharge papers.  I would also recommend calling your PCP on Monday to update them on your diagnosis.  If your pain suddenly worsens or is not responding to the prescribed pain meds, please return to the emergency department promptly.  Please stay at home and rest and do not go to work for the next several days.

## 2021-10-25 NOTE — ED Triage Notes (Signed)
Pt to ED from home with c/o abd pain and vomiting x 2 weeks. Pt states she is waiting for a GI appointment but couldn't make it until then. Alert and oriented x 4, NAD.

## 2021-10-25 NOTE — ED Provider Triage Note (Signed)
Emergency Medicine Provider Triage Evaluation Note  Marissa Cox , a 61 y.o. female  was evaluated in triage.  Pt complains of abdominal pain for the past 2 weeks.  Was seen at urgent care and was prescribed Zofran.  Endorses nausea and vomiting.  Pain is mostly in the right side of the abdomen.  Denies hematuria, dysuria.  Denies chest pain, shortness of breath.  Has not been able to tolerate any p.o. intake.  Has history of gastric sleeve in 2013.  Review of Systems  Positive: As above Negative: As above  Physical Exam  There were no vitals taken for this visit. Gen:   Awake, no distress   Resp:  Normal effort  MSK:   Moves extremities without difficulty  Other:  Tenderness palpation present over right side of the abdomen.  Medical Decision Making  Medically screening exam initiated at 2:13 PM.  Appropriate orders placed.  Marissa Cox was informed that the remainder of the evaluation will be completed by another provider, this initial triage assessment does not replace that evaluation, and the importance of remaining in the ED until their evaluation is complete.     Evlyn Courier, PA-C 10/25/21 1416

## 2021-10-28 ENCOUNTER — Telehealth: Payer: Self-pay | Admitting: Internal Medicine

## 2021-10-28 ENCOUNTER — Other Ambulatory Visit (HOSPITAL_COMMUNITY): Payer: Self-pay

## 2021-10-28 NOTE — Progress Notes (Unsigned)
10/28/2021 Marissa Cox [id number] 07/12/1960   Chief Complaint: Pancreatitis   History of Present Illness: Marissa Cox is a 61 year old female with a past medical history of hypertension, hyperlipidemia, obesity, GERD and colon polyps. S/P gastric sleeve surgery 2013. She presents today for further evaluation regarding pancreatitis.   She went to urgent care 10/12/2021  She went to the ED 10/25/2021  She received IV  fluids and morphine and her abdominal pain improved. Admission was discussed but she elected to go home.      Latest Ref Rng & Units 10/25/2021    2:48 PM 04/14/2016    8:44 PM 03/22/2013    2:09 PM  CBC  WBC 4.0 - 10.5 K/uL 5.6  7.0    Hemoglobin 12.0 - 15.0 g/dL 11.3  12.3  14.3   Hematocrit 36.0 - 46.0 % 37.5  39.1  42.0   Platelets 150 - 400 K/uL 279  260         Latest Ref Rng & Units 10/25/2021    2:48 PM 04/14/2016    8:44 PM 03/22/2013    2:09 PM  CMP  Glucose 70 - 99 mg/dL 103  107  96   BUN 8 - 23 mg/dL '14  9  14   '$ Creatinine 0.44 - 1.00 mg/dL 1.12  1.06  1.00   Sodium 135 - 145 mmol/L 138  138  141   Potassium 3.5 - 5.1 mmol/L 3.4  3.3  3.7   Chloride 98 - 111 mmol/L 101  101  100   CO2 22 - 32 mmol/L 27  27    Calcium 8.9 - 10.3 mg/dL 9.8  9.3    Total Protein 6.5 - 8.1 g/dL 7.3  7.2    Total Bilirubin 0.3 - 1.2 mg/dL 0.3  0.6    Alkaline Phos 38 - 126 U/L 71  78    AST 15 - 41 U/L 22  20    ALT 0 - 44 U/L 17  14     Lipase 264.   CTAP with contrast 10/25/2021:  CLINICAL DATA:  Diffuse abdominal pain. Nausea vomiting after eating. Symptoms for 3 weeks.   EXAM: CT ABDOMEN AND PELVIS WITH CONTRAST   TECHNIQUE: Multidetector CT imaging of the abdomen and pelvis was performed using the standard protocol following bolus administration of intravenous contrast.   RADIATION DOSE REDUCTION: This exam was performed according to the departmental dose-optimization program which includes automated exposure control, adjustment  of the mA and/or kV according to patient size and/or use of iterative reconstruction technique.   CONTRAST:  81m OMNIPAQUE IOHEXOL 350 MG/ML SOLN   COMPARISON:  06/18/2016.   FINDINGS: Lower chest: Clear lung bases.   Hepatobiliary: No focal liver abnormality is seen. No gallstones, gallbladder wall thickening, or biliary dilatation.   Pancreas: Subtle inflammatory type haziness adjacent to the pancreatic head and second and third portion of the duodenum. No pancreatic mass or other inflammatory change. No duct dilation.   Spleen: Normal in size without focal abnormality.   Adrenals/Urinary Tract: 1.4 cm left adrenal nodule, relative washout over 40%, consistent with an adenoma, stable from the prior CT. No follow-up indicated. Normal right adrenal gland.   Kidneys normal in size, orientation and position with symmetric enhancement and excretion. Subcentimeter low-attenuation lesion, posterior mid to upper pole the left kidney, too small to characterize, consistent with a cyst. No follow-up recommended. No other renal masses or lesions. No stones. No hydronephrosis. Normal  ureters. Normal bladder.   Stomach/Bowel: Previous gastric stapling, stable. No stomach wall thickening or inflammation. Small bowel and colon are normal in caliber. No wall thickening. No inflammation. Scattered left colon diverticula. No evidence of appendicitis.   Vascular/Lymphatic: Aortic atherosclerosis. No aneurysm. No enlarged lymph nodes.   Reproductive: Status post hysterectomy. No adnexal masses.   Other: No abdominal wall hernia or abnormality. No abdominopelvic ascites.   Musculoskeletal: No fracture or acute finding.  No bone lesion.   IMPRESSION: 1. Subtle inflammatory type changes surrounds the pancreatic head. Suspect mild uncomplicated pancreatitis. Findings could be from adjacent to 1 notice, felt less likely. 2. No other evidence of an acute abnormality within the abdomen  or pelvis. 3. Aortic atherosclerosis.   PAST GI PROCEDURES:   Colonoscopy 06/21/2020 by Dr. Hilarie Fredrickson: - Perianal skin tags found on perianal exam. - Two 4 to 5 mm polyps in the cecum, removed with a cold snare. Resected and retrieved. - Two 4 to 5 mm polyps at the hepatic flexure, removed with a cold snare. Resected and Retrieved - One 4 mm polyp in the transverse colon, removed with a cold snare. Resected and retrieved. - Diverticulosis in the sigmoid colon, in the descending colon, in the transverse colon and in the ascending colon. - 3 year recall colonoscopy  1. Surgical [P], colon, cecum, polyp (2) - TUBULAR ADENOMA WITHOUT HIGH-GRADE DYSPLASIA OR MALIGNANCY - SMALL SERRATED POLYP, CANNOT DISTINGUISH BETWEEN A SESSILE SERRATED POLYP AND HYPERPLASTIC POLYP 2. Surgical [P], colon, hepatic flexure, transverse, polyp (3) - TUBULAR ADENOMA WITHOUT HIGH-GRADE DYSPLASIA OR MALIGNANCY - HYPERPLASTIC POLYP - BENIGN LEIOMYOMA  Colonoscopy 11/06/2009 by Dr. Juanita Craver:    Past Medical History:  Diagnosis Date   Allergy    Anemia    Arthritis    not diagnosed - in knees per pt    COPD (chronic obstructive pulmonary disease) ([employer])    ? per PCP- ref to pulmonary    GERD (gastroesophageal reflux disease)    occ    Hyperlipidemia    Hypertension    Morbid obesity ([place])    PONV (postoperative nausea and vomiting)    Past Surgical History:  Procedure Laterality Date   COLONOSCOPY     LAPAROSCOPIC GASTRIC SLEEVE RESECTION  12/22/2011   Procedure: LAPAROSCOPIC GASTRIC SLEEVE RESECTION;  Surgeon: Madilyn Hook, DO;  Location: [facility];  Service: General;  Laterality: N/A;   [place] GASTROINTESTINAL ENDOSCOPY     UPPER GI ENDOSCOPY  12/22/2011   Procedure: UPPER GI ENDOSCOPY;  Surgeon: Madilyn Hook, DO;  Location: [facility];  Service: General;;       Current Medications, Allergies, Past  Medical History, Past Surgical History, Family History and Social History were reviewed in [facility] record.   Review of Systems:   Constitutional: Negative for fever, sweats, chills or weight loss.  Respiratory: Negative for shortness of breath.   Cardiovascular: Negative for chest pain, palpitations and leg swelling.  Gastrointestinal: See HPI.  Musculoskeletal: Negative for back pain or muscle aches.  Neurological: Negative for dizziness, headaches or paresthesias.    Physical Exam: There were no vitals taken for this visit. General: Well developed, w   ***female in no acute distress. Head: Normocephalic and atraumatic. Eyes: No scleral icterus. Conjunctiva pink . Ears: Normal auditory acuity. Mouth: Dentition intact. No ulcers or lesions.  Lungs: Clear throughout to auscultation. Heart: Regular rate  and rhythm, no murmur. Abdomen: Soft, nontender and nondistended. No masses or hepatomegaly. Normal bowel sounds x 4 quadrants.  Rectal: *** Musculoskeletal: Symmetrical with no gross deformities. Extremities: No edema. Neurological: Alert oriented x 4. No focal deficits.  Psychological: Alert and cooperative. Normal mood and affect  Assessment and Recommendations:  61 year old female with acute pancreatitis.  -triglyceride level, lipase   History of colon polyps

## 2021-10-28 NOTE — Telephone Encounter (Signed)
Inbound call from patient stating that she has a appointment with Vicie Mutters on 10/23 and was seen in the ER on 9/30 and was diagnosed with pancreatitis. Patient stated that they suggested that she needed to be seen before 10/23. Patient is requesting a call back to discuss if there is anyway she can be seen sooner. Please advise.

## 2021-10-28 NOTE — Telephone Encounter (Signed)
Pt scheduled to see Carl Best NP tomorrow at 8:30am. Pt aware of appt.

## 2021-10-29 ENCOUNTER — Ambulatory Visit (INDEPENDENT_AMBULATORY_CARE_PROVIDER_SITE_OTHER): Payer: No Typology Code available for payment source | Admitting: Nurse Practitioner

## 2021-10-29 ENCOUNTER — Other Ambulatory Visit (INDEPENDENT_AMBULATORY_CARE_PROVIDER_SITE_OTHER): Payer: No Typology Code available for payment source

## 2021-10-29 ENCOUNTER — Other Ambulatory Visit (HOSPITAL_COMMUNITY): Payer: Self-pay

## 2021-10-29 ENCOUNTER — Encounter: Payer: Self-pay | Admitting: Nurse Practitioner

## 2021-10-29 VITALS — BP 132/68 | HR 85 | Ht 67.0 in | Wt 249.0 lb

## 2021-10-29 DIAGNOSIS — D649 Anemia, unspecified: Secondary | ICD-10-CM

## 2021-10-29 DIAGNOSIS — K8500 Idiopathic acute pancreatitis without necrosis or infection: Secondary | ICD-10-CM

## 2021-10-29 LAB — IBC + FERRITIN
Ferritin: 177.6 ng/mL (ref 10.0–291.0)
Iron: 62 ug/dL (ref 42–145)
Saturation Ratios: 21.1 % (ref 20.0–50.0)
TIBC: 294 ug/dL (ref 250.0–450.0)
Transferrin: 210 mg/dL — ABNORMAL LOW (ref 212.0–360.0)

## 2021-10-29 LAB — LIPASE: Lipase: 40 U/L (ref 11.0–59.0)

## 2021-10-29 MED ORDER — FAMOTIDINE 20 MG PO TABS
20.0000 mg | ORAL_TABLET | Freq: Every day | ORAL | 1 refills | Status: DC
  Filled 2021-10-29: qty 30, 30d supply, fill #0
  Filled 2021-12-31: qty 30, 30d supply, fill #1

## 2021-10-29 NOTE — Patient Instructions (Signed)
Your provider has requested that you go to the basement level for lab work before leaving today. Press "B" on the elevator. The lab is located at the first door on the left as you exit the elevator.  You have been scheduled for an MRI/MRCP at [facility] on 11/08/21. Your appointment time is 9:00 am. Please arrive to admitting (at main entrance of the hospital) 15 minutes prior to your appointment time for registration purposes. Please make certain not to have anything to eat or drink 6 hours prior to your test. In addition, if you have any metal in your body, have a pacemaker or defibrillator, please be sure to let your ordering physician know. This test typically takes 45 minutes to 1 hour to complete. Should you need to reschedule, please call [phone] to do so.  Start Famotidine '20mg'$  one tab at bed time to reduce stomach acid production   Maintain a low fat diet   Go to the emergency room if you develop severe nausea, vomiting or abdominal pain   You have been scheduled for an endoscopy. Please follow written instructions given to you at your visit today. If you use inhalers (even only as needed), please bring them with you on the day of your procedure.  The [facility] providers would like to encourage you to use [facility] to communicate with providers for non-urgent requests or questions.  Due to long hold times on the telephone, sending your provider a message by [facility] may be a faster and more efficient way to get a response.  Please allow 48 business hours for a response.  Please remember that this is for non-urgent requests.   Due to recent changes in healthcare laws, you may see the results of your imaging and laboratory studies on MyChart before your provider has had a chance to review them.  We understand that in some cases there may be results that are confusing or concerning to you. Not all laboratory results come back in the same time frame and the provider may be waiting for  multiple results in order to interpret others.  Please give [place] 48 hours in order for your provider to thoroughly review all the results before contacting the office for clarification of your results.

## 2021-10-30 ENCOUNTER — Ambulatory Visit
Admission: RE | Admit: 2021-10-30 | Discharge: 2021-10-30 | Disposition: A | Discharge status: Home / Self Care | Payer: No Typology Code available for payment source | Source: Ambulatory Visit | Attending: Internal Medicine | Admitting: Internal Medicine

## 2021-10-30 ENCOUNTER — Other Ambulatory Visit (HOSPITAL_COMMUNITY): Payer: Self-pay

## 2021-10-30 DIAGNOSIS — Z1231 Encounter for screening mammogram for malignant neoplasm of breast: Secondary | ICD-10-CM

## 2021-10-30 NOTE — Progress Notes (Signed)
Addendum: Reviewed and agree with assessment and management plan. Selenia Mihok M, MD  

## 2021-11-02 ENCOUNTER — Telehealth: Payer: No Typology Code available for payment source | Admitting: Physician Assistant

## 2021-11-02 DIAGNOSIS — U071 COVID-19: Secondary | ICD-10-CM | POA: Diagnosis not present

## 2021-11-02 MED ORDER — NIRMATRELVIR/RITONAVIR (PAXLOVID) TABLET (RENAL DOSING): 2.0000 | ORAL_TABLET | Freq: Two times a day (BID) | ORAL | 0 refills | Status: AC

## 2021-11-02 MED ORDER — BENZONATATE 100 MG PO CAPS: 100.0000 mg | ORAL_CAPSULE | Freq: Three times a day (TID) | ORAL | 0 refills | Status: DC | PRN

## 2021-11-02 NOTE — Patient Instructions (Signed)
Marissa Cox, thank you for joining Leeanne Rio, PA-C for today's virtual visit.  While this provider is not your primary care provider (PCP), if your PCP is located in our provider database this encounter information will be shared with them immediately following your visit.  Consent: (Patient) Marissa Cox provided verbal consent for this virtual visit at the beginning of the encounter.  Current Medications:  Current Outpatient Medications:    albuterol (PROVENTIL) (2.5 MG/3ML) 0.083% nebulizer solution, Take 3 mLs (1 vial) by nebulization every 6 (six) hours as needed., Disp: 75 mL, Rfl: 2   albuterol (VENTOLIN HFA) 108 (90 Base) MCG/ACT inhaler, Inhale 1 puff into the lungs every 4 (four) hours., Disp: 18 g, Rfl: 1   atorvastatin (LIPITOR) 20 MG tablet, Take 1 tablet (20 mg total) by mouth daily., Disp: 90 tablet, Rfl: 4   Budeson-Glycopyrrol-Formoterol (BREZTRI AEROSPHERE) 160-9-4.8 MCG/ACT AERO, Inhale 2 puffs into the lungs 2 (two) times daily., Disp: 32.1 g, Rfl: 4   famotidine (PEPCID) 20 MG tablet, Take 1 tablet (20 mg total) by mouth at bedtime., Disp: 30 tablet, Rfl: 1   lisinopril-hydrochlorothiazide (ZESTORETIC) 20-25 MG tablet, Take 1 tablet by mouth daily., Disp: 90 tablet, Rfl: 4   ondansetron (ZOFRAN-ODT) 4 MG disintegrating tablet, Take 1 tablet (4 mg total) by mouth every 8 (eight) hours as needed for nausea or vomiting., Disp: 20 tablet, Rfl: 0  Current Facility-Administered Medications:    0.9 %  sodium chloride infusion, 500 mL, Intravenous, Once, Pyrtle, Lajuan Lines, MD   Medications ordered in this encounter:  No orders of the defined types were placed in this encounter.    *If you need refills on other medications prior to your next appointment, please contact your pharmacy*  Follow-Up: Call back or seek an in-person evaluation if the symptoms worsen or if the condition fails to improve as anticipated.  [place] [phone]  Other Instructions Please keep well-hydrated and get plenty of rest. Start a saline nasal rinse to flush out your nasal passages. You can use plain Mucinex to help thin congestion. If you have a humidifier, running in the bedroom at night. I want you to start OTC vitamin D3 1000 units daily, vitamin C 1000 mg daily, and a zinc supplement. Please take prescribed medications as directed.  You have been enrolled in a MyChart symptom monitoring program. Please answer these questions daily so we can keep track of how you are doing.  You were to quarantine for 5 days from onset of your symptoms.  After day 5, if you have had no fever and you are feeling better, you can end quarantine but need to mask for an additional 5 days. After day 5 if you have a fever or are having significant symptoms, please quarantine for full 10 days.  If you note any worsening of symptoms, any significant shortness of breath or any chest pain, please seek ER evaluation ASAP.  Please do not delay care!  COVID-19: What to Do if You Are Sick If you test positive and are an older adult or someone who is at high risk of getting very sick from COVID-19, treatment may be available. Contact a healthcare provider right away after a positive test to determine if you are eligible, even if your symptoms are mild right now. You can also visit a Test to Treat location and, if eligible, receive a prescription from a provider. Don't delay: Treatment must be started within the first few days  to be effective. If you have a fever, cough, or other symptoms, you might have COVID-19. Most people have mild illness and are able to recover at home. If you are sick: Keep track of your symptoms. If you have an emergency warning sign (including trouble breathing), call 911. Steps to help prevent the spread of COVID-19 if you are sick If you are sick with COVID-19 or think you might have COVID-19, follow the steps below to care for  yourself and to help protect other people in your home and community. Stay home except to get medical care Stay home. Most people with COVID-19 have mild illness and can recover at home without medical care. Do not leave your home, except to get medical care. Do not visit public areas and do not go to places where you are unable to wear a mask. Take care of yourself. Get rest and stay hydrated. Take over-the-counter medicines, such as acetaminophen, to help you feel better. Stay in touch with your doctor. Call before you get medical care. Be sure to get care if you have trouble breathing, or have any other emergency warning signs, or if you think it is an emergency. Avoid public transportation, ride-sharing, or taxis if possible. Get tested If you have symptoms of COVID-19, get tested. While waiting for test results, stay away from others, including staying apart from those living in your household. Get tested as soon as possible after your symptoms start. Treatments may be available for people with COVID-19 who are at risk for becoming very sick. Don't delay: Treatment must be started early to be effective--some treatments must begin within 5 days of your first symptoms. Contact your healthcare provider right away if your test result is positive to determine if you are eligible. Self-tests are one of several options for testing for the virus that causes COVID-19 and may be more convenient than laboratory-based tests and point-of-care tests. Ask your healthcare provider or your local health department if you need help interpreting your test results. You can visit your state, tribal, local, and territorial health department's website to look for the latest local information on testing sites. Separate yourself from other people As much as possible, stay in a specific room and away from other people and pets in your home. If possible, you should use a separate bathroom. If you need to be around other people  or animals in or outside of the home, wear a well-fitting mask. Tell your close contacts that they may have been exposed to COVID-19. An infected person can spread COVID-19 starting 48 hours (or 2 days) before the person has any symptoms or tests positive. By letting your close contacts know they may have been exposed to COVID-19, you are helping to protect everyone. See COVID-19 and Animals if you have questions about pets. If you are diagnosed with COVID-19, someone from the health department may call you. Answer the call to slow the spread. Monitor your symptoms Symptoms of COVID-19 include fever, cough, or other symptoms. Follow care instructions from your healthcare provider and local health department. Your local health authorities may give instructions on checking your symptoms and reporting information. When to seek emergency medical attention Look for emergency warning signs* for COVID-19. If someone is showing any of these signs, seek emergency medical care immediately: Trouble breathing Persistent pain or pressure in the chest New confusion Inability to wake or stay awake Pale, gray, or blue-colored skin, lips, or nail beds, depending on skin tone *This list is not  all possible symptoms. Please call your medical provider for any other symptoms that are severe or concerning to you. Call 911 or call ahead to your local emergency facility: Notify the operator that you are seeking care for someone who has or may have COVID-19. Call ahead before visiting your doctor Call ahead. Many medical visits for routine care are being postponed or done by phone or telemedicine. If you have a medical appointment that cannot be postponed, call your doctor's office, and tell them you have or may have COVID-19. This will help the office protect themselves and other patients. If you are sick, wear a well-fitting mask You should wear a mask if you must be around other people or animals, including pets (even  at home). Wear a mask with the best fit, protection, and comfort for you. You don't need to wear the mask if you are alone. If you can't put on a mask (because of trouble breathing, for example), cover your coughs and sneezes in some other way. Try to stay at least 6 feet away from other people. This will help protect the people around you. Masks should not be placed on young children under age 48 years, anyone who has trouble breathing, or anyone who is not able to remove the mask without help. Cover your coughs and sneezes Cover your mouth and nose with a tissue when you cough or sneeze. Throw away used tissues in a lined trash can. Immediately wash your hands with soap and water for at least 20 seconds. If soap and water are not available, clean your hands with an alcohol-based hand sanitizer that contains at least 60% alcohol. Clean your hands often Wash your hands often with soap and water for at least 20 seconds. This is especially important after blowing your nose, coughing, or sneezing; going to the bathroom; and before eating or preparing food. Use hand sanitizer if soap and water are not available. Use an alcohol-based hand sanitizer with at least 60% alcohol, covering all surfaces of your hands and rubbing them together until they feel dry. Soap and water are the best option, especially if hands are visibly dirty. Avoid touching your eyes, nose, and mouth with unwashed hands. Handwashing Tips Avoid sharing personal household items Do not share dishes, drinking glasses, cups, eating utensils, towels, or bedding with other people in your home. Wash these items thoroughly after using them with soap and water or put in the dishwasher. Clean surfaces in your home regularly Clean and disinfect high-touch surfaces (for example, doorknobs, tables, handles, light switches, and countertops) in your "sick room" and bathroom. In shared spaces, you should clean and disinfect surfaces and items after  each use by the person who is ill. If you are sick and cannot clean, a caregiver or other person should only clean and disinfect the area around you (such as your bedroom and bathroom) on an as needed basis. Your caregiver/other person should wait as long as possible (at least several hours) and wear a mask before entering, cleaning, and disinfecting shared spaces that you use. Clean and disinfect areas that may have blood, stool, or body fluids on them. Use household cleaners and disinfectants. Clean visible dirty surfaces with household cleaners containing soap or detergent. Then, use a household disinfectant. Use a product from [employer] List N: Disinfectants for Coronavirus (HRCBU-38). Be sure to follow the instructions on the label to ensure safe and effective use of the product. Many products recommend keeping the surface wet with a disinfectant for  a certain period of time (look at "contact time" on the product label). You may also need to wear personal protective equipment, such as gloves, depending on the directions on the product label. Immediately after disinfecting, wash your hands with soap and water for 20 seconds. For completed guidance on cleaning and disinfecting your home, visit Complete Disinfection Guidance. Take steps to improve ventilation at home Improve ventilation (air flow) at home to help prevent from spreading COVID-19 to other people in your household. Clear out COVID-19 virus particles in the air by opening windows, using air filters, and turning on fans in your home. Use this interactive tool to learn how to improve air flow in your home. When you can be around others after being sick with COVID-19 Deciding when you can be around others is different for different situations. Find out when you can safely end home isolation. For any additional questions about your care, contact your healthcare provider or state or local health department. 04/16/2020 Content source: [facility] for Immunization and Respiratory Diseases ([facility]), Division of Viral Diseases This information is not intended to replace advice given to you by your health care provider. Make sure you discuss any questions you have with your health care provider. Document Revised: 05/30/2020 Document Reviewed: 05/30/2020 Elsevier Patient Education  2022 [employer].      If you have been instructed to have an in-person evaluation today at a local Urgent Care facility, please use the link below. It will take you to a list of all of our available [facility], including address, phone number and hours of operation. Please do not delay care.  [facility]  If you or a family member do not have a primary care provider, use the link below to schedule a visit and establish care. When you choose a [place] primary care physician or advanced practice provider, you gain a long-term partner in health. Find a Primary Care Provider  Learn more about [facility]'s in-office and virtual care options: [facility]

## 2021-11-02 NOTE — Progress Notes (Signed)
Virtual Visit Consent   Amneet Cendejas, you are scheduled for a virtual visit with a [facility] provider today. Just as with appointments in the office, your consent must be obtained to participate. Your consent will be active for this visit and any virtual visit you may have with one of our providers in the next 365 days. If you have a MyChart account, a copy of this consent can be sent to you electronically.  As this is a virtual visit, video technology does not allow for your provider to perform a traditional examination. This may limit your provider's ability to fully assess your condition. If your provider identifies any concerns that need to be evaluated in person or the need to arrange testing (such as labs, EKG, etc.), we will make arrangements to do so. Although advances in technology are sophisticated, we cannot ensure that it will always work on either your end or our end. If the connection with a video visit is poor, the visit may have to be switched to a telephone visit. With either a video or telephone visit, we are not always able to ensure that we have a secure connection.  By engaging in this virtual visit, you consent to the provision of healthcare and authorize for your insurance to be billed (if applicable) for the services provided during this visit. Depending on your insurance coverage, you may receive a charge related to this service.  I need to obtain your verbal consent now. Are you willing to proceed with your visit today? Rhea Kaelin Barua has provided verbal consent on 11/02/2021 for a virtual visit (video or telephone). Leeanne Rio, [place]  Date: 11/02/2021 2:22 PM  Virtual Visit via Video Note   I, Leeanne Rio, connected with  Amauria Younts  ([id number], May 15, 1960) on 11/02/21 at  2:15 PM EDT by a video-enabled telemedicine application and verified that I am speaking with the correct person using two  identifiers.  Location: Patient: Virtual Visit Location Patient: Home Provider: Virtual Visit Location Provider: Home Office   I discussed the limitations of evaluation and management by telemedicine and the availability of in person appointments. The patient expressed understanding and agreed to proceed.    History of Present Illness: Sari Cogan is a 61 y.o. who identifies as a female who was assigned female at birth, and is being seen today for COVID-19. Endorses symptoms starting Friday PM with chills. Since then -- hot and cold, fatigue, mild nasal congestion, worse at night. Denies chest pain or SOB. Some loose caliber stool but denies overt diarrhea. Denies nausea or vomiting.   HPI: HPI  Problems:  Patient Active Problem List   Diagnosis Date Noted   Adjustment disorder with anxiety 05/30/2020   Anxiety 05/30/2020   Acute exacerbation of chronic obstructive airways disease ([place]) 05/30/2020   Asthma attack 05/30/2020   Chronic obstructive pulmonary disease, unspecified ([place]) 05/30/2020   Body mass index (BMI) 40.0-44.9, adult ([place]) 05/30/2020   Chronic osteoarthritis 05/30/2020   Dyslipidemia 05/30/2020   Essential hypertension 05/30/2020   Iron deficiency anemia 05/30/2020   Low back pain 05/30/2020   Mild intermittent asthma 05/30/2020   Smokers' cough ([place]) 05/30/2020   Tobacco user 05/30/2020   Urinary tract infectious disease 05/30/2020   Morbid obesity ([place]) 08/16/2019   Lesion of adrenal gland ([place]) 08/16/2019   Plantar fasciitis 11/25/2018    Allergies:  Allergies  Allergen Reactions   Other     Other reaction(s): nightmares with Chantix- is now  on lower dose    Tramadol Hcl     Other reaction(s): vomiting   Medications:  Current Outpatient Medications:    oxyCODONE-acetaminophen (PERCOCET/ROXICET) 5-325 MG tablet, Take by mouth every 4 (four) hours as needed., Disp: , Rfl:    albuterol (PROVENTIL) (2.5 MG/3ML) 0.083% nebulizer solution, Take 3  mLs (1 vial) by nebulization every 6 (six) hours as needed., Disp: 75 mL, Rfl: 2   albuterol (VENTOLIN HFA) 108 (90 Base) MCG/ACT inhaler, Inhale 1 puff into the lungs every 4 (four) hours., Disp: 18 g, Rfl: 1   atorvastatin (LIPITOR) 20 MG tablet, Take 1 tablet (20 mg total) by mouth daily., Disp: 90 tablet, Rfl: 4   Budeson-Glycopyrrol-Formoterol (BREZTRI AEROSPHERE) 160-9-4.8 MCG/ACT AERO, Inhale 2 puffs into the lungs 2 (two) times daily., Disp: 32.1 g, Rfl: 4   famotidine (PEPCID) 20 MG tablet, Take 1 tablet (20 mg total) by mouth at bedtime., Disp: 30 tablet, Rfl: 1   lisinopril-hydrochlorothiazide (ZESTORETIC) 20-25 MG tablet, Take 1 tablet by mouth daily., Disp: 90 tablet, Rfl: 4   ondansetron (ZOFRAN-ODT) 4 MG disintegrating tablet, Take 1 tablet (4 mg total) by mouth every 8 (eight) hours as needed for nausea or vomiting., Disp: 20 tablet, Rfl: 0  Current Facility-Administered Medications:    0.9 %  sodium chloride infusion, 500 mL, Intravenous, Once, Pyrtle, Lajuan Lines, MD  Observations/Objective: Patient is well-developed, well-nourished in no acute distress.  Resting comfortably at home.  Head is normocephalic, atraumatic.  No labored breathing. Speech is clear and coherent with logical content.  Patient is alert and oriented at baseline.   Assessment and Plan: 1. COVID-19 - MyChart COVID-19 home monitoring program; Future  Patient with multiple risk factors for complicated course of illness. Discussed risks/benefits of antiviral medications including most common potential ADRs. Patient voiced understanding and would like to proceed with antiviral medication. They are candidate for Paxlovid at renal dosing based on recent GFR. Rx sent to pharmacy. Supportive measures, OTC medications and vitamin regimen reviewed. Tessalon per orders. She is to continue her asthma medications. Patient has been enrolled in a MyChart COVID symptom monitoring program. Samule Dry reviewed in detail. Strict  ER precautions discussed with patient.    Follow Up Instructions: I discussed the assessment and treatment plan with the patient. The patient was provided an opportunity to ask questions and all were answered. The patient agreed with the plan and demonstrated an understanding of the instructions.  A copy of instructions were sent to the patient via MyChart unless otherwise noted below.   The patient was advised to call back or seek an in-person evaluation if the symptoms worsen or if the condition fails to improve as anticipated.  Time:  I spent 10 minutes with the patient via telehealth technology discussing the above problems/concerns.    Leeanne Rio, PA-C

## 2021-11-03 ENCOUNTER — Other Ambulatory Visit (HOSPITAL_COMMUNITY): Payer: Self-pay

## 2021-11-05 ENCOUNTER — Other Ambulatory Visit (HOSPITAL_COMMUNITY): Payer: Self-pay

## 2021-11-06 ENCOUNTER — Other Ambulatory Visit (HOSPITAL_COMMUNITY): Payer: Self-pay

## 2021-11-06 MED ORDER — LISINOPRIL-HYDROCHLOROTHIAZIDE 20-25 MG PO TABS
1.0000 | ORAL_TABLET | Freq: Every day | ORAL | 4 refills | Status: DC
  Filled 2021-11-06 – 2022-01-26 (×2): qty 90, 90d supply, fill #0
  Filled 2022-08-19: qty 90, 90d supply, fill #1

## 2021-11-06 MED ORDER — BREZTRI AEROSPHERE 160-9-4.8 MCG/ACT IN AERO
2.0000 | INHALATION_SPRAY | Freq: Two times a day (BID) | RESPIRATORY_TRACT | 4 refills | Status: DC
  Filled 2021-11-06: qty 32.1, 90d supply, fill #0
  Filled 2021-12-31: qty 10.7, 30d supply, fill #0

## 2021-11-06 MED ORDER — ATORVASTATIN CALCIUM 20 MG PO TABS
20.0000 mg | ORAL_TABLET | Freq: Every day | ORAL | 4 refills | Status: DC
  Filled 2021-11-06: qty 90, 90d supply, fill #0

## 2021-11-08 ENCOUNTER — Ambulatory Visit (HOSPITAL_COMMUNITY): Admission: RE | Admit: 2021-11-08 | Payer: No Typology Code available for payment source | Source: Ambulatory Visit

## 2021-11-11 ENCOUNTER — Other Ambulatory Visit (HOSPITAL_COMMUNITY): Payer: Self-pay

## 2021-11-13 ENCOUNTER — Other Ambulatory Visit (HOSPITAL_COMMUNITY): Payer: Self-pay

## 2021-11-16 ENCOUNTER — Ambulatory Visit (HOSPITAL_COMMUNITY)
Admission: RE | Admit: 2021-11-16 | Discharge: 2021-11-16 | Disposition: A | Discharge status: Home / Self Care | Payer: No Typology Code available for payment source | Source: Ambulatory Visit | Attending: Nurse Practitioner | Admitting: Nurse Practitioner

## 2021-11-16 DIAGNOSIS — K8500 Idiopathic acute pancreatitis without necrosis or infection: Secondary | ICD-10-CM | POA: Insufficient documentation

## 2021-11-16 MED ORDER — GADOBUTROL 1 MMOL/ML IV SOLN
11.0000 mL | Freq: Once | INTRAVENOUS | Status: AC | PRN
  Administered 2021-11-16: 11 mL via INTRAVENOUS

## 2021-11-17 ENCOUNTER — Ambulatory Visit: Payer: No Typology Code available for payment source | Admitting: Physician Assistant

## 2021-11-19 ENCOUNTER — Other Ambulatory Visit (HOSPITAL_COMMUNITY): Payer: Self-pay

## 2021-11-25 ENCOUNTER — Other Ambulatory Visit (HOSPITAL_COMMUNITY): Payer: Self-pay

## 2021-11-26 ENCOUNTER — Other Ambulatory Visit (HOSPITAL_COMMUNITY): Payer: Self-pay

## 2021-12-01 ENCOUNTER — Ambulatory Visit (AMBULATORY_SURGERY_CENTER): Payer: No Typology Code available for payment source | Admitting: Internal Medicine

## 2021-12-01 ENCOUNTER — Encounter: Payer: Self-pay | Admitting: Internal Medicine

## 2021-12-01 VITALS — BP 118/61 | HR 63 | Temp 97.8°F | Resp 13 | Ht 67.0 in | Wt 249.0 lb

## 2021-12-01 DIAGNOSIS — K2100 Gastro-esophageal reflux disease with esophagitis, without bleeding: Secondary | ICD-10-CM

## 2021-12-01 DIAGNOSIS — K449 Diaphragmatic hernia without obstruction or gangrene: Secondary | ICD-10-CM | POA: Diagnosis not present

## 2021-12-01 DIAGNOSIS — R933 Abnormal findings on diagnostic imaging of other parts of digestive tract: Secondary | ICD-10-CM

## 2021-12-01 DIAGNOSIS — R1013 Epigastric pain: Secondary | ICD-10-CM

## 2021-12-01 DIAGNOSIS — R1011 Right upper quadrant pain: Secondary | ICD-10-CM

## 2021-12-01 MED ORDER — SODIUM CHLORIDE 0.9 % IV SOLN: 500.0000 mL | INTRAVENOUS | Status: DC

## 2021-12-01 NOTE — Patient Instructions (Signed)
Discharge instructions given. Handouts on Esophagitis and Hiatal Hernia. Resume previous medications. YOU HAD AN ENDOSCOPIC PROCEDURE TODAY AT [facility]:   Refer to the procedure report that was given to you for any specific questions about what was found during the examination.  If the procedure report does not answer your questions, please call your gastroenterologist to clarify.  If you requested that your care partner not be given the details of your procedure findings, then the procedure report has been included in a sealed envelope for you to review at your convenience later.  YOU SHOULD EXPECT: Some feelings of bloating in the abdomen. Passage of more gas than usual.  Walking can help get rid of the air that was put into your GI tract during the procedure and reduce the bloating. If you had a lower endoscopy (such as a colonoscopy or flexible sigmoidoscopy) you may notice spotting of blood in your stool or on the toilet paper. If you underwent a bowel prep for your procedure, you may not have a normal bowel movement for a few days.  Please Note:  You might notice some irritation and congestion in your nose or some drainage.  This is from the oxygen used during your procedure.  There is no need for concern and it should clear up in a day or so.  SYMPTOMS TO REPORT IMMEDIATELY:   Following upper endoscopy (EGD)  Vomiting of blood or coffee ground material  New chest pain or pain under the shoulder blades  Painful or persistently difficult swallowing  New shortness of breath  Fever of 100F or higher  Black, tarry-looking stools  For urgent or emergent issues, a gastroenterologist can be reached at any hour by calling [phone]. Do not use MyChart messaging for urgent concerns.    DIET:  We do recommend a small meal at first, but then you may proceed to your regular diet.  Drink plenty of fluids but you should avoid alcoholic beverages for 24 hours.  ACTIVITY:   You should plan to take it easy for the rest of today and you should NOT DRIVE or use heavy machinery until tomorrow (because of the sedation medicines used during the test).    FOLLOW UP: Our staff will call the number listed on your records the next business day following your procedure.  We will call around 7:15- 8:00 am to check on you and address any questions or concerns that you may have regarding the information given to you following your procedure. If we do not reach you, we will leave a message.     If any biopsies were taken you will be contacted by phone or by letter within the next 1-3 weeks.  Please call us at [phone] if you have not heard about the biopsies in 3 weeks.    SIGNATURES/CONFIDENTIALITY: You and/or your care partner have signed paperwork which will be entered into your electronic medical record.  These signatures attest to the fact that that the information above on your After Visit Summary has been reviewed and is understood.  Full responsibility of the confidentiality of this discharge information lies with you and/or your care-partner.

## 2021-12-01 NOTE — Progress Notes (Signed)
GASTROENTEROLOGY PROCEDURE H&P NOTE   Primary Care Physician: Leeroy Cha, MD    Reason for Procedure:  Nausea, vomiting epigastric and right upper quadrant pain, abnormal GI imaging  Plan:    EGD  Patient is appropriate for endoscopic procedure(s) in the ambulatory ([facility]) setting.  The nature of the procedure, as well as the risks, benefits, and alternatives were carefully and thoroughly reviewed with the patient. Ample time for discussion and questions allowed. The patient understood, was satisfied, and agreed to proceed.     HPI: Marissa Cox is a 61 y.o. female who presents for EGD.  Medical history as below.   No recent chest pain or shortness of breath.  No abdominal pain today.  Past Medical History:  Diagnosis Date   Allergy    Anemia    Arthritis    not diagnosed - in knees per pt    COPD (chronic obstructive pulmonary disease) ([place])    ? per PCP- ref to pulmonary    GERD (gastroesophageal reflux disease)    occ    Hyperlipidemia    Hypertension    Morbid obesity ([place])    PONV (postoperative nausea and vomiting)     Past Surgical History:  Procedure Laterality Date   COLONOSCOPY     LAPAROSCOPIC GASTRIC SLEEVE RESECTION  12/22/2011   Procedure: LAPAROSCOPIC GASTRIC SLEEVE RESECTION;  Surgeon: Madilyn Hook, DO;  Location: [facility];  Service: General;  Laterality: N/A;   [facility]STRO[facility]     UPPER GI ENDOSCOPY  12/22/2011   Procedure: UPPER GI ENDOSCOPY;  Surgeon: Madilyn Hook, DO;  Location: [facility];  Service: General;;    Prior to Admission medications   Medication Sig Start Date End Date Taking? Authorizing Provider  atorvastatin (LIPITOR) 20 MG tablet Take 1 tablet (20 mg total) by mouth daily. 10/21/21  Yes   Budeson-Glycopyrrol-Formoterol (BREZTRI AEROSPHERE) 160-9-4.8 MCG/ACT AERO Inhale 2 puffs into the lungs 2 (two)  times daily. 10/21/21  Yes   famotidine (PEPCID) 20 MG tablet Take 1 tablet (20 mg total) by mouth at bedtime. 10/29/21  Yes Noralyn Pick, NP  lisinopril-hydrochlorothiazide (ZESTORETIC) 20-25 MG tablet Take 1 tablet by mouth daily. 10/21/21  Yes   albuterol (PROVENTIL) (2.5 MG/3ML) 0.083% nebulizer solution Take 3 mLs (1 vial) by nebulization every 6 (six) hours as needed. 10/14/20     albuterol (VENTOLIN HFA) 108 (90 Base) MCG/ACT inhaler Inhale 1 puff into the lungs every 4 (four) hours. 05/06/20     benzonatate (TESSALON) 100 MG capsule Take 1 capsule (100 mg total) by mouth 3 (three) times daily as needed for cough. 11/02/21   Brunetta Jeans, PA-C  Budeson-Glycopyrrol-Formoterol (BREZTRI AEROSPHERE) 160-9-4.8 MCG/ACT AERO Inhale 2 puffs into the lungs 2 (two) times daily. 11/06/21     lisinopril-hydrochlorothiazide (ZESTORETIC) 20-25 MG tablet Take 1 tablet by mouth daily. 11/06/21     ondansetron (ZOFRAN-ODT) 4 MG disintegrating tablet Take 1 tablet (4 mg total) by mouth every 8 (eight) hours as needed for nausea or vomiting. 10/12/21   Nyoka Lint, PA-C  hydrochlorothiazide (HYDRODIURIL) 25 MG tablet Take 1 tablet (25 mg total) by mouth daily. 10/14/20 08/01/21    lisinopril (ZESTRIL) 10 MG tablet Take 1 tablet (10 mg total) by mouth daily. 10/14/20 08/01/21      Current Outpatient Medications  Medication Sig Dispense Refill   atorvastatin (LIPITOR) 20 MG tablet  Take 1 tablet (20 mg total) by mouth daily. 90 tablet 4   Budeson-Glycopyrrol-Formoterol (BREZTRI AEROSPHERE) 160-9-4.8 MCG/ACT AERO Inhale 2 puffs into the lungs 2 (two) times daily. 32.1 g 4   famotidine (PEPCID) 20 MG tablet Take 1 tablet (20 mg total) by mouth at bedtime. 30 tablet 1   lisinopril-hydrochlorothiazide (ZESTORETIC) 20-25 MG tablet Take 1 tablet by mouth daily. 90 tablet 4   albuterol (PROVENTIL) (2.5 MG/3ML) 0.083% nebulizer solution Take 3 mLs (1 vial) by nebulization every 6 (six) hours as needed. 75 mL 2    albuterol (VENTOLIN HFA) 108 (90 Base) MCG/ACT inhaler Inhale 1 puff into the lungs every 4 (four) hours. 18 g 1   benzonatate (TESSALON) 100 MG capsule Take 1 capsule (100 mg total) by mouth 3 (three) times daily as needed for cough. 30 capsule 0   Budeson-Glycopyrrol-Formoterol (BREZTRI AEROSPHERE) 160-9-4.8 MCG/ACT AERO Inhale 2 puffs into the lungs 2 (two) times daily. 32.1 g 4   lisinopril-hydrochlorothiazide (ZESTORETIC) 20-25 MG tablet Take 1 tablet by mouth daily. 90 tablet 4   ondansetron (ZOFRAN-ODT) 4 MG disintegrating tablet Take 1 tablet (4 mg total) by mouth every 8 (eight) hours as needed for nausea or vomiting. 20 tablet 0   Current Facility-Administered Medications  Medication Dose Route Frequency Provider Last Rate Last Admin   0.9 %  sodium chloride infusion  500 mL Intravenous Once Zlatan Hornback, Lajuan Lines, MD       0.9 %  sodium chloride infusion  500 mL Intravenous Continuous Kadir Azucena, Lajuan Lines, MD        Allergies as of 12/01/2021 - Review Complete 12/01/2021  Allergen Reaction Noted   Other  05/06/2020   Tramadol hcl  05/06/2020    Family History  Problem Relation Age of Onset   Cancer Mother        bladder   Bladder Cancer Mother    Cancer Maternal Aunt        breast   Breast cancer Maternal Aunt    Cancer Paternal Aunt        breast   Breast cancer Paternal Aunt    Cancer Paternal Aunt        breast   Breast cancer Paternal Aunt    Cancer Paternal Aunt        breast   Cancer Maternal Aunt        ovarian   Breast cancer Maternal Aunt    Colon cancer Neg Hx    Colon polyps Neg Hx    Esophageal cancer Neg Hx    Rectal cancer Neg Hx    Stomach cancer Neg Hx     Social History   Socioeconomic History   Marital status: Widowed    Spouse name: Not on file   Number of children: Not on file   Years of education: Not on file   Highest education level: Not on file  Occupational History   Not on file  Tobacco Use   Smoking status: Every Day    Packs/day: 0.50     Types: Cigarettes   Smokeless tobacco: Former    Quit date: 08/07/2011   Tobacco comments:    < 0.5 / pack a day per pt   Vaping Use   Vaping Use: Never used  Substance and Sexual Activity   Alcohol use: Yes    Comment: occ    Drug use: No   Sexual activity: Not on file  Other Topics Concern   Not on file  Social History  Narrative   Not on file   Social Determinants of Health   Financial Resource Strain: Not on file  Food Insecurity: No Food Insecurity (08/14/2020)   Hunger Vital Sign    Worried About Running Out of Food in the Last Year: Never true    Ran Out of Food in the Last Year: Never true  Transportation Needs: No Transportation Needs (08/14/2020)   PRAPARE - Hydrologist (Medical): No    Lack of Transportation (Non-Medical): No  Physical Activity: Not on file  Stress: Not on file  Social Connections: Not on file  Intimate Partner Violence: Not on file    Physical Exam: Vital signs in last 24 hours: '@BP'$  (!) 120/51   Pulse (!) 57   Temp 97.8 F (36.6 C) (Temporal)   Ht '5\' 7"'$  (1.702 m)   Wt 249 lb (112.9 kg)   SpO2 98%   BMI 39.00 kg/m  GEN: NAD EYE: Sclerae anicteric ENT: MMM CV: Non-tachycardic Pulm: CTA b/l GI: Soft, NT/ND NEURO:  Alert & Oriented x 3   Zenovia Jarred, MD [facility]  12/01/2021 2:18 PM

## 2021-12-01 NOTE — Op Note (Signed)
[facility] Patient Name: Marissa Cox Procedure Date: 12/01/2021 2:17 PM MRN: [id number] Endoscopist: Jerene Bears , MD, [id number] Age: 61 Referring MD:  Date of Birth: 11-28-1960 Gender: Female Account #: [id number] Procedure:                Upper GI endoscopy Indications:              Epigastric abdominal pain, Abdominal pain in the                            right upper quadrant felt most likely to be                            idiopathic pancreatitis, abd imaging with fullness                            in duodenum versus secondary to pancreatitis -- all                            at this time resolved without recurrence Medicines:                Monitored Anesthesia Care Procedure:                Pre-Anesthesia Assessment:                           - Prior to the procedure, a History and Physical                            was performed, and patient medications and                            allergies were reviewed. The patient's tolerance of                            previous anesthesia was also reviewed. The risks                            and benefits of the procedure and the sedation                            options and risks were discussed with the patient.                            All questions were answered, and informed consent                            was obtained. Prior Anticoagulants: The patient has                            taken no anticoagulant or antiplatelet agents. ASA                            Grade Assessment: III - A patient with severe  systemic disease. After reviewing the risks and                            benefits, the patient was deemed in satisfactory                            condition to undergo the procedure.                           After obtaining informed consent, the endoscope was                            passed under direct vision. Throughout the                            procedure, the  patient's blood pressure, pulse, and                            oxygen saturations were monitored continuously. The                            Endoscope was introduced through the mouth, and                            advanced to the third part of duodenum. The upper                            GI endoscopy was accomplished without difficulty.                            The patient tolerated the procedure well. Scope In: Scope Out: Findings:                 LA Grade A (one or more mucosal breaks less than 5                            mm, not extending between tops of 2 mucosal folds)                            esophagitis with no bleeding was found 39 to 40 cm                            from the incisors.                           A 1 cm hiatal hernia was present.                           Evidence of a sleeve gastrectomy was found in the                            gastric body. This was characterized by healthy  appearing mucosa.                           The examined duodenum was normal. Complications:            No immediate complications. Estimated Blood Loss:     Estimated blood loss: none. Impression:               - LA Grade A (mild) reflux esophagitis with no                            bleeding.                           - 1 cm hiatal hernia.                           - A sleeve gastrectomy was found, characterized by                            healthy appearing mucosa.                           - Normal examined duodenum.                           - No specimens collected. Recommendation:           - Patient has a contact number available for                            emergencies. The signs and symptoms of potential                            delayed complications were discussed with the                            patient. Return to normal activities tomorrow.                            Written discharge instructions were provided to the                             patient.                           - Resume previous diet.                           - Continue present medications. Can use famotidine                            20 mg twice daily if heartburn or acid indigestion.                            If this is not working to control symptoms please  contact my office.                           - Given resolution of abd pain (pancreatitis                            symptoms) and recent MRI/MRCP nothing further                            recommended. However, if recurrent upper abdominal                            or right upper abdominal pain, nausea/vomiting                            please contact my office immediately. If so then                            EUS should be considered. Jerene Bears, MD 12/01/2021 2:39:13 PM This report has been signed electronically.

## 2021-12-01 NOTE — Progress Notes (Signed)
To pacu, VSS. Report to [email or web address] 

## 2021-12-02 ENCOUNTER — Telehealth: Payer: Self-pay

## 2021-12-02 NOTE — Telephone Encounter (Signed)
  Follow up Call-     12/01/2021    1:36 PM 06/21/2020   11:05 AM  Call back number  Post procedure Call Back phone  # [phone]  Permission to leave phone message Yes Yes     Patient questions:  Do you have a fever, pain , or abdominal swelling? No. Pain Score  0 *  Have you tolerated food without any problems? Yes.    Have you been able to return to your normal activities? Yes.    Do you have any questions about your discharge instructions: Diet   No. Medications  No. Follow up visit  No.  Do you have questions or concerns about your Care? No.  Actions: * If pain score is 4 or above: No action needed, pain <4.

## 2021-12-11 ENCOUNTER — Other Ambulatory Visit (HOSPITAL_COMMUNITY): Payer: Self-pay

## 2021-12-31 ENCOUNTER — Other Ambulatory Visit (HOSPITAL_COMMUNITY): Payer: Self-pay

## 2021-12-31 ENCOUNTER — Other Ambulatory Visit: Payer: Self-pay

## 2022-01-26 ENCOUNTER — Other Ambulatory Visit: Payer: Self-pay | Admitting: Nurse Practitioner

## 2022-01-27 ENCOUNTER — Other Ambulatory Visit (HOSPITAL_COMMUNITY): Payer: Self-pay

## 2022-01-27 ENCOUNTER — Other Ambulatory Visit: Payer: Self-pay

## 2022-01-27 MED ORDER — FAMOTIDINE 20 MG PO TABS
20.0000 mg | ORAL_TABLET | Freq: Every day | ORAL | 1 refills | Status: DC
  Filled 2022-01-27 – 2022-04-10 (×3): qty 30, 30d supply, fill #0
  Filled 2022-05-08: qty 30, 30d supply, fill #1

## 2022-02-12 ENCOUNTER — Other Ambulatory Visit (HOSPITAL_COMMUNITY): Payer: Self-pay

## 2022-02-19 DIAGNOSIS — Z6841 Body Mass Index (BMI) 40.0 and over, adult: Secondary | ICD-10-CM | POA: Diagnosis not present

## 2022-02-19 DIAGNOSIS — Z131 Encounter for screening for diabetes mellitus: Secondary | ICD-10-CM | POA: Diagnosis not present

## 2022-02-19 DIAGNOSIS — Z713 Dietary counseling and surveillance: Secondary | ICD-10-CM | POA: Diagnosis not present

## 2022-02-19 DIAGNOSIS — Z1329 Encounter for screening for other suspected endocrine disorder: Secondary | ICD-10-CM | POA: Diagnosis not present

## 2022-02-19 DIAGNOSIS — Z13220 Encounter for screening for lipoid disorders: Secondary | ICD-10-CM | POA: Diagnosis not present

## 2022-02-19 DIAGNOSIS — Z0000 Encounter for general adult medical examination without abnormal findings: Secondary | ICD-10-CM | POA: Diagnosis not present

## 2022-02-23 ENCOUNTER — Other Ambulatory Visit (HOSPITAL_COMMUNITY): Payer: Self-pay

## 2022-02-23 MED ORDER — WEGOVY 0.25 MG/0.5ML ~~LOC~~ SOAJ
0.2500 mg | SUBCUTANEOUS | 0 refills | Status: AC
  Filled 2022-02-23 – 2022-03-04 (×3): qty 2, 28d supply, fill #0

## 2022-02-23 MED ORDER — WEGOVY 1 MG/0.5ML ~~LOC~~ SOAJ
1.0000 mg | SUBCUTANEOUS | 0 refills | Status: DC
  Filled 2022-02-23 – 2022-05-08 (×4): qty 2, 28d supply, fill #0

## 2022-02-23 MED ORDER — WEGOVY 0.5 MG/0.5ML ~~LOC~~ SOAJ
SUBCUTANEOUS | 0 refills | Status: DC
  Filled 2022-02-23 – 2022-04-14 (×3): qty 2, 28d supply, fill #0

## 2022-02-25 ENCOUNTER — Other Ambulatory Visit: Payer: Self-pay

## 2022-02-25 ENCOUNTER — Other Ambulatory Visit (HOSPITAL_COMMUNITY): Payer: Self-pay

## 2022-02-26 ENCOUNTER — Other Ambulatory Visit (HOSPITAL_COMMUNITY): Payer: Self-pay

## 2022-03-04 ENCOUNTER — Other Ambulatory Visit (HOSPITAL_COMMUNITY): Payer: Self-pay

## 2022-03-10 ENCOUNTER — Other Ambulatory Visit (HOSPITAL_COMMUNITY): Payer: Self-pay

## 2022-04-08 ENCOUNTER — Other Ambulatory Visit (HOSPITAL_COMMUNITY): Payer: Self-pay

## 2022-04-10 ENCOUNTER — Other Ambulatory Visit (HOSPITAL_COMMUNITY): Payer: Self-pay

## 2022-04-14 ENCOUNTER — Other Ambulatory Visit: Payer: Self-pay

## 2022-04-14 ENCOUNTER — Other Ambulatory Visit (HOSPITAL_COMMUNITY): Payer: Self-pay

## 2022-04-20 ENCOUNTER — Other Ambulatory Visit (HOSPITAL_COMMUNITY): Payer: Self-pay

## 2022-04-21 ENCOUNTER — Other Ambulatory Visit (HOSPITAL_COMMUNITY): Payer: Self-pay

## 2022-04-21 DIAGNOSIS — I10 Essential (primary) hypertension: Secondary | ICD-10-CM | POA: Diagnosis not present

## 2022-04-21 DIAGNOSIS — E785 Hyperlipidemia, unspecified: Secondary | ICD-10-CM | POA: Diagnosis not present

## 2022-04-21 DIAGNOSIS — J449 Chronic obstructive pulmonary disease, unspecified: Secondary | ICD-10-CM | POA: Diagnosis not present

## 2022-04-21 DIAGNOSIS — J4520 Mild intermittent asthma, uncomplicated: Secondary | ICD-10-CM | POA: Diagnosis not present

## 2022-04-21 DIAGNOSIS — Z720 Tobacco use: Secondary | ICD-10-CM | POA: Diagnosis not present

## 2022-04-21 MED ORDER — NICOTINE 14 MG/24HR TD PT24
14.0000 mg | MEDICATED_PATCH | Freq: Every day | TRANSDERMAL | 0 refills | Status: DC
  Filled 2022-04-21: qty 28, 28d supply, fill #0

## 2022-04-21 MED ORDER — BUPROPION HCL ER (XL) 150 MG PO TB24
150.0000 mg | ORAL_TABLET | Freq: Every morning | ORAL | 0 refills | Status: DC
  Filled 2022-04-21: qty 30, 30d supply, fill #0

## 2022-04-23 ENCOUNTER — Other Ambulatory Visit: Payer: Self-pay

## 2022-04-30 DIAGNOSIS — Z6839 Body mass index (BMI) 39.0-39.9, adult: Secondary | ICD-10-CM | POA: Diagnosis not present

## 2022-04-30 DIAGNOSIS — Z713 Dietary counseling and surveillance: Secondary | ICD-10-CM | POA: Diagnosis not present

## 2022-04-30 DIAGNOSIS — E669 Obesity, unspecified: Secondary | ICD-10-CM | POA: Diagnosis not present

## 2022-05-08 ENCOUNTER — Other Ambulatory Visit (HOSPITAL_COMMUNITY): Payer: Self-pay

## 2022-05-19 ENCOUNTER — Other Ambulatory Visit: Payer: Self-pay

## 2022-05-19 ENCOUNTER — Other Ambulatory Visit (HOSPITAL_COMMUNITY): Payer: Self-pay

## 2022-05-19 ENCOUNTER — Ambulatory Visit: Payer: 59 | Admitting: Family

## 2022-05-19 ENCOUNTER — Encounter: Payer: Self-pay | Admitting: Family

## 2022-05-19 DIAGNOSIS — M5416 Radiculopathy, lumbar region: Secondary | ICD-10-CM | POA: Diagnosis not present

## 2022-05-19 MED ORDER — PREDNISONE 50 MG PO TABS
50.0000 mg | ORAL_TABLET | Freq: Every day | ORAL | 0 refills | Status: DC
  Filled 2022-05-19: qty 5, 5d supply, fill #0

## 2022-05-19 MED ORDER — METHOCARBAMOL 500 MG PO TABS
500.0000 mg | ORAL_TABLET | Freq: Four times a day (QID) | ORAL | 0 refills | Status: DC | PRN
  Filled 2022-05-19: qty 30, 8d supply, fill #0

## 2022-05-19 NOTE — Progress Notes (Signed)
Office Visit Note   Patient: Marissa Cox           Date of Birth: [birth year]           MRN: [id number] Visit Date: 05/19/2022              Requested by: Lorenda Ishihara, MD [address] [place],  [place] [address] PCP: Lorenda Ishihara, MD  Chief Complaint  Patient presents with   Right Knee - Pain   Right Hip - Pain      HPI: The patient is a 62 year old woman who presents today complaining of moderate to severe low back pain.  She complains of lateral thigh pain bilaterally.  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  She does have a history of lumbar radiculopathy, sciatica.  She has not had any new injuries did have radiographs of her low back last year.  These were reviewed today.  She states that she has used muscle relaxers in the past which have helped with her pain and symptoms.  Assessment & Plan: Visit Diagnoses:  1. Radiculopathy, lumbar region     Plan: Discussed lumbar radiculopathy.  Will place her on a course of prednisone as well as given a prescription for some Robaxin.  Will proceed with lumbar MRI  Follow-Up Instructions: Return mri review.   Back Exam   Tenderness  The patient is experiencing no tenderness.   Range of Motion  The patient has normal back ROM.  Muscle Strength  The patient has normal back strength.  Tests  Straight leg raise right: positive Straight leg raise left: positive      Patient is alert, oriented, no adenopathy, well-dressed, normal affect, normal respiratory effort.   Imaging: No results found. No images are attached to the encounter.  Labs: Lab Results  Component Value Date   REPTSTATUS 03/24/2013 FINAL 03/22/2013   CULT  03/22/2013    No Beta Hemolytic Streptococci Isolated Performed at [facility]     Lab Results  Component Value Date   ALBUMIN 3.6  10/25/2021   ALBUMIN 3.6 04/14/2016   ALBUMIN 3.9 05/05/2012    Lab Results  Component Value Date   MG 1.9 05/05/2012   No results found for: "VD25OH"  No results found for: "PREALBUMIN"    Latest Ref Rng & Units 10/25/2021    2:48 PM 04/14/2016    8:44 PM 03/22/2013    2:09 PM  CBC EXTENDED  WBC 4.0 - 10.5 K/uL 5.6  7.0    RBC 3.87 - 5.11 MIL/uL 4.90  5.30    Hemoglobin 12.0 - 15.0 g/dL 19.1  47.8  29.5   HCT 36.0 - 46.0 % 37.5  39.1  42.0   Platelets 150 - 400 K/uL 279  260    NEUT# 1.7 - 7.7 K/uL 3.6     Lymph# 0.7 - 4.0 K/uL 1.5        There is no height or weight on file to calculate BMI.  Orders:  No orders of the defined types were placed in this encounter.  No orders of the defined types were placed in this encounter.    Procedures: No procedures performed  Clinical Data: No additional findings.  ROS:  All other systems negative, except as noted in the HPI. Review of Systems  Constitutional: Negative.   Musculoskeletal:  Positive  for back pain and myalgias.  Neurological:  Positive for numbness. Negative for weakness.    Objective: Vital Signs: There were no vitals taken for this visit.  Specialty Comments:  No specialty comments available.  PMFS History: Patient Active Problem List   Diagnosis Date Noted   Adjustment disorder with anxiety 05/30/2020   Anxiety 05/30/2020   Acute exacerbation of chronic obstructive airways disease 05/30/2020   Asthma attack 05/30/2020   Chronic obstructive pulmonary disease, unspecified 05/30/2020   Body mass index (BMI) 40.0-44.9, adult 05/30/2020   Chronic osteoarthritis 05/30/2020   Dyslipidemia 05/30/2020   Essential hypertension 05/30/2020   Iron deficiency anemia 05/30/2020   Low back pain 05/30/2020   Mild intermittent asthma 05/30/2020   Smokers' cough 05/30/2020   Tobacco user 05/30/2020   Urinary tract infectious disease 05/30/2020   Morbid obesity 08/16/2019   Lesion of adrenal gland  08/16/2019   Plantar fasciitis 11/25/2018   Past Medical History:  Diagnosis Date   Allergy    Anemia    Arthritis    not diagnosed - in knees per pt    COPD (chronic obstructive pulmonary disease)    ? per PCP- ref to pulmonary    GERD (gastroesophageal reflux disease)    occ    Hyperlipidemia    Hypertension    Morbid obesity    PONV (postoperative nausea and vomiting)     Family History  Problem Relation Age of Onset   Cancer Mother        bladder   Bladder Cancer Mother    Cancer Maternal Aunt        breast   Breast cancer Maternal Aunt    Cancer Paternal Aunt        breast   Breast cancer Paternal Aunt    Cancer Paternal Aunt        breast   Breast cancer Paternal Aunt    Cancer Paternal Aunt        breast   Cancer Maternal Aunt        ovarian   Breast cancer Maternal Aunt    Colon cancer Neg Hx    Colon polyps Neg Hx    Esophageal cancer Neg Hx    Rectal cancer Neg Hx    Stomach cancer Neg Hx     Past Surgical History:  Procedure Laterality Date   COLONOSCOPY     LAPAROSCOPIC GASTRIC SLEEVE RESECTION  12/22/2011   Procedure: LAPAROSCOPIC GASTRIC SLEEVE RESECTION;  Surgeon: Lodema Pilot, DO;  Location: [facility];  Service: General;  Laterality: N/A;   LAPAROSCOPIC TOTAL HYSTERECTOMY  1990   MULTIPLE TOOTH EXTRACTIONS  1981   TUBAL LIGATION  1990   UPPER GASTROINTESTINAL ENDOSCOPY     UPPER GI ENDOSCOPY  12/22/2011   Procedure: UPPER GI ENDOSCOPY;  Surgeon: Lodema Pilot, DO;  Location: [facility];  Service: General;;   Social History   Occupational History   Not on file  Tobacco Use   Smoking status: Every Day    Packs/day: .5    Types: Cigarettes   Smokeless tobacco: Former    Quit date: 08/07/2011   Tobacco comments:    < 0.5 / pack a day per pt   Vaping Use   Vaping Use: Never used  Substance and Sexual Activity   Alcohol use: Yes    Comment: occ    Drug use: No   Sexual activity: Not on file

## 2022-06-02 ENCOUNTER — Other Ambulatory Visit (HOSPITAL_COMMUNITY): Payer: Self-pay

## 2022-06-05 ENCOUNTER — Telehealth: Payer: Self-pay

## 2022-06-05 ENCOUNTER — Other Ambulatory Visit (HOSPITAL_COMMUNITY): Payer: Self-pay

## 2022-06-05 MED ORDER — CYCLOBENZAPRINE HCL 10 MG PO TABS
10.0000 mg | ORAL_TABLET | Freq: Three times a day (TID) | ORAL | 0 refills | Status: DC | PRN
  Filled 2022-06-05: qty 30, 10d supply, fill #0

## 2022-06-05 NOTE — Addendum Note (Signed)
Addended by: Barnie Del R on: 06/05/2022 11:01 AM   Modules accepted: Orders

## 2022-06-05 NOTE — Telephone Encounter (Signed)
Patient would like to know if a new muscle relaxer can be sent to her pharmacy.  Stated that the Robaxin is not helping.  CB# [phone].  Please advise.  Thank you.

## 2022-06-05 NOTE — Telephone Encounter (Signed)
Recently seen for lumbar spine pain. Robaxin is not helping her. Wants another option

## 2022-08-19 ENCOUNTER — Other Ambulatory Visit: Payer: Self-pay | Admitting: Nurse Practitioner

## 2022-08-19 ENCOUNTER — Other Ambulatory Visit (HOSPITAL_COMMUNITY): Payer: Self-pay

## 2022-08-19 ENCOUNTER — Other Ambulatory Visit: Payer: Self-pay

## 2022-08-19 MED ORDER — BUPROPION HCL ER (XL) 150 MG PO TB24
150.0000 mg | ORAL_TABLET | Freq: Every morning | ORAL | 1 refills | Status: DC
  Filled 2022-08-19: qty 30, 30d supply, fill #0

## 2022-08-19 MED ORDER — FAMOTIDINE 20 MG PO TABS
20.0000 mg | ORAL_TABLET | Freq: Every day | ORAL | 1 refills | Status: DC
  Filled 2022-08-19: qty 30, 30d supply, fill #0
  Filled 2022-12-31 (×2): qty 30, 30d supply, fill #1

## 2022-08-23 ENCOUNTER — Other Ambulatory Visit: Payer: Self-pay

## 2022-08-23 ENCOUNTER — Encounter (HOSPITAL_COMMUNITY): Payer: Self-pay

## 2022-08-23 ENCOUNTER — Emergency Department (HOSPITAL_COMMUNITY)
Admission: EM | Admit: 2022-08-23 | Discharge: 2022-08-24 | Disposition: A | Discharge status: Home / Self Care | Payer: 59 | Attending: Emergency Medicine | Admitting: Emergency Medicine

## 2022-08-23 ENCOUNTER — Emergency Department (HOSPITAL_COMMUNITY): Payer: 59

## 2022-08-23 DIAGNOSIS — K760 Fatty (change of) liver, not elsewhere classified: Secondary | ICD-10-CM | POA: Diagnosis not present

## 2022-08-23 DIAGNOSIS — J449 Chronic obstructive pulmonary disease, unspecified: Secondary | ICD-10-CM | POA: Diagnosis not present

## 2022-08-23 DIAGNOSIS — E876 Hypokalemia: Secondary | ICD-10-CM | POA: Insufficient documentation

## 2022-08-23 DIAGNOSIS — K859 Acute pancreatitis without necrosis or infection, unspecified: Secondary | ICD-10-CM | POA: Diagnosis not present

## 2022-08-23 DIAGNOSIS — R739 Hyperglycemia, unspecified: Secondary | ICD-10-CM | POA: Diagnosis not present

## 2022-08-23 DIAGNOSIS — R1011 Right upper quadrant pain: Secondary | ICD-10-CM | POA: Diagnosis not present

## 2022-08-23 DIAGNOSIS — K5730 Diverticulosis of large intestine without perforation or abscess without bleeding: Secondary | ICD-10-CM | POA: Diagnosis not present

## 2022-08-23 DIAGNOSIS — R7309 Other abnormal glucose: Secondary | ICD-10-CM

## 2022-08-23 LAB — CBC WITH DIFFERENTIAL/PLATELET
Abs Immature Granulocytes: 0.01 10*3/uL (ref 0.00–0.07)
Basophils Absolute: 0 10*3/uL (ref 0.0–0.1)
Basophils Relative: 1 %
Eosinophils Absolute: 0.1 10*3/uL (ref 0.0–0.5)
Eosinophils Relative: 2 %
HCT: 40 % (ref 36.0–46.0)
Hemoglobin: 12.1 g/dL (ref 12.0–15.0)
Immature Granulocytes: 0 %
Lymphocytes Relative: 31 %
Lymphs Abs: 1.7 10*3/uL (ref 0.7–4.0)
MCH: 23.3 pg — ABNORMAL LOW (ref 26.0–34.0)
MCHC: 30.3 g/dL (ref 30.0–36.0)
MCV: 77.1 fL — ABNORMAL LOW (ref 80.0–100.0)
Monocytes Absolute: 0.4 10*3/uL (ref 0.1–1.0)
Monocytes Relative: 8 %
Neutro Abs: 3.1 10*3/uL (ref 1.7–7.7)
Neutrophils Relative %: 58 %
Platelets: 257 10*3/uL (ref 150–400)
RBC: 5.19 MIL/uL — ABNORMAL HIGH (ref 3.87–5.11)
RDW: 12.9 % (ref 11.5–15.5)
WBC: 5.3 10*3/uL (ref 4.0–10.5)
nRBC: 0 % (ref 0.0–0.2)

## 2022-08-23 LAB — COMPREHENSIVE METABOLIC PANEL
ALT: 16 U/L (ref 0–44)
AST: 20 U/L (ref 15–41)
Albumin: 3.4 g/dL — ABNORMAL LOW (ref 3.5–5.0)
Alkaline Phosphatase: 88 U/L (ref 38–126)
Anion gap: 11 (ref 5–15)
BUN: 8 mg/dL (ref 8–23)
CO2: 22 mmol/L (ref 22–32)
Calcium: 9.1 mg/dL (ref 8.9–10.3)
Chloride: 103 mmol/L (ref 98–111)
Creatinine, Ser: 1.03 mg/dL — ABNORMAL HIGH (ref 0.44–1.00)
GFR, Estimated: >60 mL/min (ref >60)
Glucose, Bld: 116 mg/dL — ABNORMAL HIGH (ref 70–99)
Potassium: 3.3 mmol/L — ABNORMAL LOW (ref 3.5–5.1)
Sodium: 136 mmol/L (ref 135–145)
Total Bilirubin: 0.6 mg/dL (ref 0.3–1.2)
Total Protein: 7.2 g/dL (ref 6.5–8.1)

## 2022-08-23 LAB — LIPASE, BLOOD: Lipase: 89 U/L — ABNORMAL HIGH (ref 11–51)

## 2022-08-23 NOTE — ED Triage Notes (Signed)
Pt reports RUQ pain and nausea since Thursday, hx of pancreatitis. Unable to keep anything down

## 2022-08-23 NOTE — ED Provider Triage Note (Signed)
Emergency Medicine Provider Triage Evaluation Note  Marissa Cox , a 62 y.o. female  was evaluated in triage.  Pt complains of ruq pain, intermittent worse over the past few days associated nausea..  Review of Systems  Positive: Right upper quadrant pain Negative: Fever  Physical Exam  BP 136/84 (BP Location: Right Arm)   Pulse 72   Temp 97.9 F (36.6 C) (Oral)   Resp 18   Ht 5\' 7"  (1.702 m)   Wt 113.4 kg   SpO2 97%   BMI 39.16 kg/m  Gen:   Awake, no distress   Resp:  Normal effort  MSK:   Moves extremities without difficulty  Other:    Medical Decision Making  Medically screening exam initiated at 8:52 PM.  Appropriate orders placed.  Marissa Cox was informed that the remainder of the evaluation will be completed by another provider, this initial triage assessment does not replace that evaluation, and the importance of remaining in the ED until their evaluation is complete.     Marissa Captain, PA-C 08/23/22 2102

## 2022-08-24 ENCOUNTER — Other Ambulatory Visit: Payer: Self-pay

## 2022-08-24 ENCOUNTER — Emergency Department (HOSPITAL_COMMUNITY): Payer: 59

## 2022-08-24 ENCOUNTER — Other Ambulatory Visit (HOSPITAL_COMMUNITY): Payer: Self-pay

## 2022-08-24 DIAGNOSIS — K5730 Diverticulosis of large intestine without perforation or abscess without bleeding: Secondary | ICD-10-CM | POA: Diagnosis not present

## 2022-08-24 DIAGNOSIS — K859 Acute pancreatitis without necrosis or infection, unspecified: Secondary | ICD-10-CM | POA: Diagnosis not present

## 2022-08-24 DIAGNOSIS — R1011 Right upper quadrant pain: Secondary | ICD-10-CM | POA: Diagnosis not present

## 2022-08-24 MED ORDER — ONDANSETRON HCL 4 MG/2ML IJ SOLN
4.0000 mg | Freq: Once | INTRAMUSCULAR | Status: AC
  Administered 2022-08-24: 4 mg via INTRAVENOUS
  Filled 2022-08-24: qty 2

## 2022-08-24 MED ORDER — MORPHINE SULFATE (PF) 4 MG/ML IV SOLN
4.0000 mg | Freq: Once | INTRAVENOUS | Status: AC
  Administered 2022-08-24: 4 mg via INTRAVENOUS
  Filled 2022-08-24: qty 1

## 2022-08-24 MED ORDER — SODIUM CHLORIDE 0.9 % IV BOLUS
1000.0000 mL | Freq: Once | INTRAVENOUS | Status: AC
  Administered 2022-08-24: 1000 mL via INTRAVENOUS

## 2022-08-24 MED ORDER — IOHEXOL 350 MG/ML SOLN
75.0000 mL | Freq: Once | INTRAVENOUS | Status: AC | PRN
  Administered 2022-08-24: 75 mL via INTRAVENOUS

## 2022-08-24 MED ORDER — POTASSIUM CHLORIDE CRYS ER 20 MEQ PO TBCR
20.0000 meq | EXTENDED_RELEASE_TABLET | Freq: Two times a day (BID) | ORAL | 0 refills | Status: DC
  Filled 2022-08-24: qty 10, 5d supply, fill #0

## 2022-08-24 MED ORDER — OXYCODONE-ACETAMINOPHEN 5-325 MG PO TABS
1.0000 | ORAL_TABLET | ORAL | 0 refills | Status: DC | PRN
Start: 2022-08-24 — End: 2023-07-20
  Filled 2022-08-24: qty 20, 4d supply, fill #0

## 2022-08-24 MED ORDER — ONDANSETRON 8 MG PO TBDP
8.0000 mg | ORAL_TABLET | Freq: Three times a day (TID) | ORAL | 0 refills | Status: AC | PRN
  Filled 2022-08-24: qty 20, 7d supply, fill #0

## 2022-08-24 MED ORDER — POTASSIUM CHLORIDE CRYS ER 20 MEQ PO TBCR
40.0000 meq | EXTENDED_RELEASE_TABLET | Freq: Once | ORAL | Status: AC
  Administered 2022-08-24: 40 meq via ORAL
  Filled 2022-08-24: qty 2

## 2022-08-24 MED ORDER — OXYCODONE-ACETAMINOPHEN 5-325 MG PO TABS
1.0000 | ORAL_TABLET | Freq: Once | ORAL | Status: AC
  Administered 2022-08-24: 1 via ORAL
  Filled 2022-08-24: qty 1

## 2022-08-24 NOTE — Discharge Instructions (Addendum)
Return if pain is not being adequately controlled at home. ?

## 2022-08-24 NOTE — ED Provider Notes (Signed)
[place] EMERGENCY DEPARTMENT AT [facility] Provider Note   CSN: [id number] Arrival date & time: 08/23/22  2020     History  Chief Complaint  Patient presents with   Abdominal Pain   Nausea    Ingry Marissa Cox is a 62 y.o. female.  The history is provided by the patient.  Abdominal Pain She has history of COPD, hyperlipidemia, pancreatitis and comes in with right upper quadrant pain for the last 3 days.  Pain does radiate to the back.  There is associated nausea but no vomiting.  Pain is similar to what she has had in the past with pancreatitis.  She did have some oxycodone-acetaminophen left over which she took and it did give some slight, temporary relief.  She denies fever or chills.  She states she is a social drinker.  She states that the cause for her pancreatitis was never elucidated.   Home Medications Prior to Admission medications   Medication Sig Start Date End Date Taking? Authorizing Provider  albuterol (PROVENTIL) (2.5 MG/3ML) 0.083% nebulizer solution Take 3 mLs (1 vial) by nebulization every 6 (six) hours as needed. 10/14/20     albuterol (VENTOLIN HFA) 108 (90 Base) MCG/ACT inhaler Inhale 1 puff into the lungs every 4 (four) hours. 05/06/20     atorvastatin (LIPITOR) 20 MG tablet Take 1 tablet (20 mg total) by mouth daily. 10/21/21     benzonatate (TESSALON) 100 MG capsule Take 1 capsule (100 mg total) by mouth 3 (three) times daily as needed for cough. 11/02/21   Waldon Merl, PA-C  Budeson-Glycopyrrol-Formoterol (BREZTRI AEROSPHERE) 160-9-4.8 MCG/ACT AERO Inhale 2 puffs into the lungs 2 (two) times daily. 10/21/21     Budeson-Glycopyrrol-Formoterol (BREZTRI AEROSPHERE) 160-9-4.8 MCG/ACT AERO Inhale 2 puffs into the lungs 2 (two) times daily. 11/06/21     buPROPion (WELLBUTRIN XL) 150 MG 24 hr tablet Take 1 tablet (150 mg total) by mouth in the morning. 08/19/22     cyclobenzaprine (FLEXERIL) 10 MG tablet Take 1 tablet (10 mg total) by mouth 3  (three) times daily as needed for muscle spasms. 06/05/22   Adonis Huguenin, NP  famotidine (PEPCID) 20 MG tablet Take 1 tablet (20 mg total) by mouth at bedtime. 08/19/22   Arnaldo Natal, NP  lisinopril-hydrochlorothiazide (ZESTORETIC) 20-25 MG tablet Take 1 tablet by mouth daily. 10/21/21     lisinopril-hydrochlorothiazide (ZESTORETIC) 20-25 MG tablet Take 1 tablet by mouth daily. 11/06/21     nicotine (NICODERM CQ - DOSED IN MG/24 HOURS) 14 mg/24hr patch Place 1 patch (14 mg total) onto the skin daily. 04/21/22     ondansetron (ZOFRAN-ODT) 4 MG disintegrating tablet Take 1 tablet (4 mg total) by mouth every 8 (eight) hours as needed for nausea or vomiting. 10/12/21   Ellsworth Lennox, PA-C  predniSONE (DELTASONE) 50 MG tablet Take 1 tablet (50 mg total) by mouth daily. 05/19/22   Adonis Huguenin, NP  Semaglutide-Weight Management (WEGOVY) 0.5 MG/0.5ML SOAJ Inject 0.5mg  every week by subcutaneous route as directed for 28 days. 02/21/22     Semaglutide-Weight Management (WEGOVY) 1 MG/0.5ML SOAJ Inject 1 mg into the skin once a week. 02/21/22     hydrochlorothiazide (HYDRODIURIL) 25 MG tablet Take 1 tablet (25 mg total) by mouth daily. 10/14/20 08/01/21    lisinopril (ZESTRIL) 10 MG tablet Take 1 tablet (10 mg total) by mouth daily. 10/14/20 08/01/21        Allergies    Other and Tramadol hcl    Review  of Systems   Review of Systems  Gastrointestinal:  Positive for abdominal pain.  All other systems reviewed and are negative.   Physical Exam Updated Vital Signs BP (!) 151/107 (BP Location: Right Arm)   Pulse (!) 52   Temp 98 F (36.7 C) (Oral)   Resp 16   Ht 5\' 7"  (1.702 m)   Wt 113.4 kg   SpO2 98%   BMI 39.16 kg/m  Physical Exam Vitals and nursing note reviewed.   62 year old female, resting comfortably and in no acute distress. Vital signs are significant for elevated blood pressure and slightly slow heart rate. Oxygen saturation is 98%, which is normal. Head is normocephalic and  atraumatic. PERRLA, EOMI. Oropharynx is clear. Neck is nontender and supple without adenopathy or JVD. Back is nontender and there is no CVA tenderness. Lungs are clear without rales, wheezes, or rhonchi. Chest is nontender. Heart has regular rate and rhythm without murmur. Abdomen is soft, flat, with moderate right upper quadrant tenderness.  There is no rebound or guarding. Extremities have no cyanosis or edema, full range of motion is present. Skin is warm and dry without rash. Neurologic: Mental status is normal, cranial nerves are intact, moves all extremities equally.  ED Results / Procedures / Treatments   Labs (all labs ordered are listed, but only abnormal results are displayed) Labs Reviewed  CBC WITH DIFFERENTIAL/PLATELET - Abnormal; Notable for the following components:      Result Value   RBC 5.19 (*)    MCV 77.1 (*)    MCH 23.3 (*)    All other components within normal limits  COMPREHENSIVE METABOLIC PANEL - Abnormal; Notable for the following components:   Potassium 3.3 (*)    Glucose, Bld 116 (*)    Creatinine, Ser 1.03 (*)    Albumin 3.4 (*)    All other components within normal limits  LIPASE, BLOOD - Abnormal; Notable for the following components:   Lipase 89 (*)    All other components within normal limits  URINALYSIS, ROUTINE W REFLEX MICROSCOPIC   Radiology CT ABDOMEN PELVIS W CONTRAST  Result Date: 08/24/2022 CLINICAL DATA:  Right upper quadrant pain and nausea. Query pancreatitis EXAM: CT ABDOMEN AND PELVIS WITH CONTRAST TECHNIQUE: Multidetector CT imaging of the abdomen and pelvis was performed using the standard protocol following bolus administration of intravenous contrast. RADIATION DOSE REDUCTION: This exam was performed according to the departmental dose-optimization program which includes automated exposure control, adjustment of the mA and/or kV according to patient size and/or use of iterative reconstruction technique. CONTRAST:  75mL OMNIPAQUE  IOHEXOL 350 MG/ML SOLN COMPARISON:  Abdominal ultrasound 08/23/2022; MRI abdomen 11/16/2021 and CT abdomen and pelvis 10/25/2021 FINDINGS: Lower chest: No acute abnormality. Hepatobiliary: Unremarkable liver. Normal gallbladder. No biliary dilation. Pancreas: Mild hazy inflammatory stranding about the pancreatic head. No pancreatic necrosis. No organized fluid collection. No ductal dilation. Spleen: Unremarkable. Adrenals/Urinary Tract: Stable adrenal glands. No urinary calculi or hydronephrosis. Bladder is unremarkable. Stomach/Bowel: Normal caliber large and small bowel. No bowel wall thickening. The appendix is normal. Postoperative change about the stomach. Colonic diverticulosis without diverticulitis. Vascular/Lymphatic: Aortic atherosclerosis. No enlarged abdominal or pelvic lymph nodes. Reproductive: Hysterectomy. Other: No free intraperitoneal fluid or air. Musculoskeletal: No acute fracture. IMPRESSION: 1. Acute uncomplicated pancreatitis. No pancreatic necrosis or organized fluid collection. Aortic Atherosclerosis (ICD10-I70.0). Electronically Signed   By: Minerva Fester M.D.   On: 08/24/2022 02:33   US ABDOMEN LIMITED RUQ (LIVER/GB)  Result Date: 08/23/2022 CLINICAL DATA:  [id number] RUQ pain 151471 EXAM: ULTRASOUND ABDOMEN LIMITED RIGHT UPPER QUADRANT COMPARISON:  None Available. FINDINGS: Gallbladder: No gallstones or wall thickening visualized. No sonographic Murphy sign noted by sonographer. Common bile duct: Diameter: 3 mm Liver: No focal lesion identified. Increased parenchymal echogenicity. Portal vein is patent on color Doppler imaging with normal direction of blood flow towards the liver. Other: None. IMPRESSION: Hepatic steatosis. Please note limited evaluation for focal hepatic masses in a patient with hepatic steatosis due to decreased penetration of the acoustic ultrasound waves. Electronically Signed   By: Tish Frederickson M.D.   On: 08/23/2022 21:30    Procedures Procedures     Medications Ordered in ED Medications - No data to display  ED Course/ Medical Decision Making/ A&P                             Medical Decision Making Amount and/or Complexity of Data Reviewed Radiology: ordered.  Risk Prescription drug management.   Right upper quadrant pain concerning for pancreatitis.  Differential diagnosis includes, but is not limited to, cholecystitis, peptic ulcer disease, gastritis, diverticulitis.  This differential includes conditions with significant risk for morbidity and complications.  I have reviewed her laboratory tests, and my interpretation is mild hypokalemia which have been present previously, mildly elevated random glucose which will need to be followed as an outpatient, borderline elevated creatinine not significantly changed from prior, mildly elevated lipase possibly consistent with pancreatitis, normal hemoglobin and WBC.  Right upper quadrant ultrasound was ordered at triage and shows hepatic steatosis but no evidence of gallstones.  I have independently viewed the images, and agree with the radiologist's interpretation.  I reviewed her past records, and she was seen in the ED on 10/25/2021 at which time CT scan of abdomen and pelvis was consistent with pancreatitis and lipase was elevated significantly higher than it is today.  I have ordered IV fluids, morphine for pain, ondansetron for nausea and I have ordered CT of abdomen and pelvis to look for evidence of pancreatitis.  She got good relief of pain with above-noted treatment.  CT scan shows evidence of acute pancreatitis.  I have independently viewed the images, and agree with radiologist's interpretation.  I have offered the patient treatment options of analgesia and antiemetics at home versus inpatient care and she is opted to go home.  I have ordered a dose of oxycodone-acetaminophen prior to discharge as well as a dose of oral potassium.  I am discharging her with prescriptions for oral  potassium, ondansetron oral dissolving tablet, and oxycodone-acetaminophen.  Follow-up with PCP in 1 week, return precautions discussed.  Final Clinical Impression(s) / ED Diagnoses Final diagnoses:  Acute pancreatitis without infection or necrosis, unspecified pancreatitis type  Elevated random blood glucose level  Hypokalemia    Rx / DC Orders ED Discharge Orders          Ordered    potassium chloride SA (KLOR-CON M) 20 MEQ tablet  2 times daily        08/24/22 0407    oxyCODONE-acetaminophen (PERCOCET) 5-325 MG tablet  Every 4 hours PRN        08/24/22 0407    ondansetron (ZOFRAN-ODT) 8 MG disintegrating tablet  Every 8 hours PRN        08/24/22 0408              Dione Booze, MD 08/24/22 0410

## 2022-10-27 ENCOUNTER — Other Ambulatory Visit: Payer: Self-pay | Admitting: Internal Medicine

## 2022-10-27 DIAGNOSIS — Z1231 Encounter for screening mammogram for malignant neoplasm of breast: Secondary | ICD-10-CM

## 2022-11-13 ENCOUNTER — Other Ambulatory Visit: Payer: Self-pay | Admitting: Internal Medicine

## 2022-11-13 DIAGNOSIS — Z1231 Encounter for screening mammogram for malignant neoplasm of breast: Secondary | ICD-10-CM

## 2022-11-27 ENCOUNTER — Ambulatory Visit
Admission: RE | Admit: 2022-11-27 | Discharge: 2022-11-27 | Disposition: A | Discharge status: Home / Self Care | Payer: 59 | Source: Ambulatory Visit | Attending: Internal Medicine | Admitting: Internal Medicine

## 2022-11-27 DIAGNOSIS — Z1231 Encounter for screening mammogram for malignant neoplasm of breast: Secondary | ICD-10-CM

## 2022-12-01 ENCOUNTER — Other Ambulatory Visit (HOSPITAL_COMMUNITY): Payer: Self-pay

## 2022-12-01 MED ORDER — ATORVASTATIN CALCIUM 20 MG PO TABS
20.0000 mg | ORAL_TABLET | Freq: Every day | ORAL | 0 refills | Status: DC
  Filled 2022-12-01 – 2022-12-31 (×3): qty 90, 90d supply, fill #0

## 2022-12-07 ENCOUNTER — Other Ambulatory Visit (HOSPITAL_COMMUNITY): Payer: Self-pay

## 2022-12-07 DIAGNOSIS — F17210 Nicotine dependence, cigarettes, uncomplicated: Secondary | ICD-10-CM | POA: Diagnosis not present

## 2022-12-07 DIAGNOSIS — J449 Chronic obstructive pulmonary disease, unspecified: Secondary | ICD-10-CM | POA: Diagnosis not present

## 2022-12-07 DIAGNOSIS — E785 Hyperlipidemia, unspecified: Secondary | ICD-10-CM | POA: Diagnosis not present

## 2022-12-07 DIAGNOSIS — Z1231 Encounter for screening mammogram for malignant neoplasm of breast: Secondary | ICD-10-CM | POA: Diagnosis not present

## 2022-12-07 DIAGNOSIS — I10 Essential (primary) hypertension: Secondary | ICD-10-CM | POA: Diagnosis not present

## 2022-12-07 DIAGNOSIS — Z1211 Encounter for screening for malignant neoplasm of colon: Secondary | ICD-10-CM | POA: Diagnosis not present

## 2022-12-07 DIAGNOSIS — Z0000 Encounter for general adult medical examination without abnormal findings: Secondary | ICD-10-CM | POA: Diagnosis not present

## 2022-12-07 DIAGNOSIS — J4520 Mild intermittent asthma, uncomplicated: Secondary | ICD-10-CM | POA: Diagnosis not present

## 2022-12-07 DIAGNOSIS — J301 Allergic rhinitis due to pollen: Secondary | ICD-10-CM | POA: Diagnosis not present

## 2022-12-07 MED ORDER — ALBUTEROL SULFATE HFA 108 (90 BASE) MCG/ACT IN AERS
2.0000 | INHALATION_SPRAY | Freq: Four times a day (QID) | RESPIRATORY_TRACT | 3 refills | Status: DC | PRN
  Filled 2022-12-07: qty 6.7, 20d supply, fill #0
  Filled 2022-12-31: qty 6.7, 30d supply, fill #0
  Filled 2022-12-31: qty 6.7, 20d supply, fill #0

## 2022-12-07 MED ORDER — LISINOPRIL-HYDROCHLOROTHIAZIDE 20-25 MG PO TABS
1.0000 | ORAL_TABLET | Freq: Every day | ORAL | 4 refills | Status: DC
  Filled 2022-12-07 – 2022-12-31 (×3): qty 90, 90d supply, fill #0
  Filled 2023-03-24 – 2023-04-16 (×2): qty 90, 90d supply, fill #1
  Filled 2023-07-24: qty 90, 90d supply, fill #2
  Filled 2023-09-30 – 2023-10-14 (×2): qty 90, 90d supply, fill #3

## 2022-12-07 MED ORDER — BREZTRI AEROSPHERE 160-9-4.8 MCG/ACT IN AERO
2.0000 | INHALATION_SPRAY | Freq: Two times a day (BID) | RESPIRATORY_TRACT | 4 refills | Status: DC
  Filled 2022-12-07: qty 32.1, 90d supply, fill #0

## 2022-12-11 ENCOUNTER — Other Ambulatory Visit (HOSPITAL_COMMUNITY): Payer: Self-pay

## 2022-12-17 ENCOUNTER — Other Ambulatory Visit (HOSPITAL_COMMUNITY): Payer: Self-pay

## 2022-12-31 ENCOUNTER — Other Ambulatory Visit: Payer: Self-pay

## 2022-12-31 ENCOUNTER — Other Ambulatory Visit (HOSPITAL_COMMUNITY): Payer: Self-pay

## 2023-03-01 DIAGNOSIS — I10 Essential (primary) hypertension: Secondary | ICD-10-CM | POA: Diagnosis not present

## 2023-03-01 DIAGNOSIS — J449 Chronic obstructive pulmonary disease, unspecified: Secondary | ICD-10-CM | POA: Diagnosis not present

## 2023-03-01 DIAGNOSIS — R229 Localized swelling, mass and lump, unspecified: Secondary | ICD-10-CM | POA: Diagnosis not present

## 2023-03-24 ENCOUNTER — Other Ambulatory Visit: Payer: Self-pay

## 2023-03-24 ENCOUNTER — Other Ambulatory Visit (HOSPITAL_COMMUNITY): Payer: Self-pay

## 2023-03-24 MED ORDER — ATORVASTATIN CALCIUM 20 MG PO TABS
20.0000 mg | ORAL_TABLET | Freq: Every day | ORAL | 3 refills | Status: AC
  Filled 2023-03-24 – 2023-04-16 (×2): qty 90, 90d supply, fill #0
  Filled 2023-07-24: qty 90, 90d supply, fill #1
  Filled 2023-09-30 – 2023-10-14 (×2): qty 90, 90d supply, fill #2

## 2023-04-05 ENCOUNTER — Other Ambulatory Visit (HOSPITAL_COMMUNITY): Payer: Self-pay

## 2023-04-05 DIAGNOSIS — D492 Neoplasm of unspecified behavior of bone, soft tissue, and skin: Secondary | ICD-10-CM | POA: Diagnosis not present

## 2023-04-05 DIAGNOSIS — B079 Viral wart, unspecified: Secondary | ICD-10-CM | POA: Diagnosis not present

## 2023-04-07 ENCOUNTER — Other Ambulatory Visit (HOSPITAL_COMMUNITY): Payer: Self-pay

## 2023-04-09 ENCOUNTER — Other Ambulatory Visit (HOSPITAL_COMMUNITY): Payer: Self-pay

## 2023-04-16 ENCOUNTER — Other Ambulatory Visit: Payer: Self-pay | Admitting: Nurse Practitioner

## 2023-04-16 ENCOUNTER — Other Ambulatory Visit: Payer: Self-pay

## 2023-04-16 ENCOUNTER — Other Ambulatory Visit (HOSPITAL_COMMUNITY): Payer: Self-pay

## 2023-04-22 ENCOUNTER — Other Ambulatory Visit (HOSPITAL_COMMUNITY): Payer: Self-pay

## 2023-04-22 ENCOUNTER — Other Ambulatory Visit: Payer: Self-pay | Admitting: Nurse Practitioner

## 2023-04-22 MED ORDER — FAMOTIDINE 20 MG PO TABS
20.0000 mg | ORAL_TABLET | Freq: Every day | ORAL | 1 refills | Status: AC
  Filled 2023-04-22 – 2023-07-29 (×3): qty 30, 30d supply, fill #0
  Filled 2023-09-30: qty 30, 30d supply, fill #1

## 2023-05-05 ENCOUNTER — Other Ambulatory Visit (HOSPITAL_COMMUNITY): Payer: Self-pay

## 2023-06-08 DIAGNOSIS — I10 Essential (primary) hypertension: Secondary | ICD-10-CM | POA: Diagnosis not present

## 2023-06-08 DIAGNOSIS — J449 Chronic obstructive pulmonary disease, unspecified: Secondary | ICD-10-CM | POA: Diagnosis not present

## 2023-06-08 DIAGNOSIS — E785 Hyperlipidemia, unspecified: Secondary | ICD-10-CM | POA: Diagnosis not present

## 2023-07-20 ENCOUNTER — Other Ambulatory Visit: Payer: Self-pay

## 2023-07-20 ENCOUNTER — Emergency Department (HOSPITAL_COMMUNITY)

## 2023-07-20 ENCOUNTER — Inpatient Hospital Stay (HOSPITAL_COMMUNITY)
Admission: EM | Admit: 2023-07-20 | Discharge: 2023-07-24 | DRG: 419 | Disposition: A | Discharge status: Home / Self Care | Attending: Family Medicine | Admitting: Family Medicine

## 2023-07-20 ENCOUNTER — Encounter (HOSPITAL_COMMUNITY): Payer: Self-pay

## 2023-07-20 DIAGNOSIS — Z7952 Long term (current) use of systemic steroids: Secondary | ICD-10-CM

## 2023-07-20 DIAGNOSIS — E785 Hyperlipidemia, unspecified: Secondary | ICD-10-CM | POA: Diagnosis present

## 2023-07-20 DIAGNOSIS — E7849 Other hyperlipidemia: Secondary | ICD-10-CM

## 2023-07-20 DIAGNOSIS — Z860101 Personal history of adenomatous and serrated colon polyps: Secondary | ICD-10-CM

## 2023-07-20 DIAGNOSIS — K5730 Diverticulosis of large intestine without perforation or abscess without bleeding: Secondary | ICD-10-CM | POA: Diagnosis not present

## 2023-07-20 DIAGNOSIS — D508 Other iron deficiency anemias: Secondary | ICD-10-CM | POA: Diagnosis not present

## 2023-07-20 DIAGNOSIS — F17210 Nicotine dependence, cigarettes, uncomplicated: Secondary | ICD-10-CM | POA: Insufficient documentation

## 2023-07-20 DIAGNOSIS — J410 Simple chronic bronchitis: Secondary | ICD-10-CM

## 2023-07-20 DIAGNOSIS — I10 Essential (primary) hypertension: Secondary | ICD-10-CM | POA: Diagnosis present

## 2023-07-20 DIAGNOSIS — J449 Chronic obstructive pulmonary disease, unspecified: Secondary | ICD-10-CM | POA: Diagnosis present

## 2023-07-20 DIAGNOSIS — Z7985 Long-term (current) use of injectable non-insulin antidiabetic drugs: Secondary | ICD-10-CM

## 2023-07-20 DIAGNOSIS — Z87891 Personal history of nicotine dependence: Secondary | ICD-10-CM

## 2023-07-20 DIAGNOSIS — D7589 Other specified diseases of blood and blood-forming organs: Secondary | ICD-10-CM | POA: Diagnosis present

## 2023-07-20 DIAGNOSIS — F411 Generalized anxiety disorder: Secondary | ICD-10-CM | POA: Insufficient documentation

## 2023-07-20 DIAGNOSIS — K8500 Idiopathic acute pancreatitis without necrosis or infection: Secondary | ICD-10-CM | POA: Diagnosis not present

## 2023-07-20 DIAGNOSIS — D509 Iron deficiency anemia, unspecified: Secondary | ICD-10-CM | POA: Diagnosis present

## 2023-07-20 DIAGNOSIS — K219 Gastro-esophageal reflux disease without esophagitis: Secondary | ICD-10-CM | POA: Diagnosis present

## 2023-07-20 DIAGNOSIS — R933 Abnormal findings on diagnostic imaging of other parts of digestive tract: Secondary | ICD-10-CM

## 2023-07-20 DIAGNOSIS — K859 Acute pancreatitis without necrosis or infection, unspecified: Principal | ICD-10-CM | POA: Diagnosis present

## 2023-07-20 DIAGNOSIS — R1013 Epigastric pain: Secondary | ICD-10-CM | POA: Diagnosis not present

## 2023-07-20 DIAGNOSIS — Z79899 Other long term (current) drug therapy: Secondary | ICD-10-CM

## 2023-07-20 DIAGNOSIS — Z9884 Bariatric surgery status: Secondary | ICD-10-CM

## 2023-07-20 DIAGNOSIS — N281 Cyst of kidney, acquired: Secondary | ICD-10-CM | POA: Diagnosis not present

## 2023-07-20 DIAGNOSIS — Z90710 Acquired absence of both cervix and uterus: Secondary | ICD-10-CM

## 2023-07-20 DIAGNOSIS — R109 Unspecified abdominal pain: Secondary | ICD-10-CM | POA: Diagnosis not present

## 2023-07-20 LAB — CBC
HCT: 37.5 % (ref 36.0–46.0)
Hemoglobin: 11.3 g/dL — ABNORMAL LOW (ref 12.0–15.0)
MCH: 23.2 pg — ABNORMAL LOW (ref 26.0–34.0)
MCHC: 30.1 g/dL (ref 30.0–36.0)
MCV: 77 fL — ABNORMAL LOW (ref 80.0–100.0)
Platelets: 249 10*3/uL (ref 150–400)
RBC: 4.87 MIL/uL (ref 3.87–5.11)
RDW: 13.8 % (ref 11.5–15.5)
WBC: 6.4 10*3/uL (ref 4.0–10.5)
nRBC: 0 % (ref 0.0–0.2)

## 2023-07-20 LAB — COMPREHENSIVE METABOLIC PANEL WITH GFR
ALT: 16 U/L (ref 0–44)
AST: 22 U/L (ref 15–41)
Albumin: 3.4 g/dL — ABNORMAL LOW (ref 3.5–5.0)
Alkaline Phosphatase: 82 U/L (ref 38–126)
Anion gap: 10 (ref 5–15)
BUN: 12 mg/dL (ref 8–23)
CO2: 24 mmol/L (ref 22–32)
Calcium: 9.3 mg/dL (ref 8.9–10.3)
Chloride: 103 mmol/L (ref 98–111)
Creatinine, Ser: 1.07 mg/dL — ABNORMAL HIGH (ref 0.44–1.00)
GFR, Estimated: 58 mL/min — ABNORMAL LOW (ref >60)
Glucose, Bld: 115 mg/dL — ABNORMAL HIGH (ref 70–99)
Potassium: 3.6 mmol/L (ref 3.5–5.1)
Sodium: 137 mmol/L (ref 135–145)
Total Bilirubin: 0.6 mg/dL (ref 0.0–1.2)
Total Protein: 7 g/dL (ref 6.5–8.1)

## 2023-07-20 LAB — URINALYSIS, ROUTINE W REFLEX MICROSCOPIC
Bilirubin Urine: NEGATIVE
Glucose, UA: NEGATIVE mg/dL
Hgb urine dipstick: NEGATIVE
Ketones, ur: NEGATIVE mg/dL
Leukocytes,Ua: NEGATIVE
Nitrite: NEGATIVE
Protein, ur: NEGATIVE mg/dL
Specific Gravity, Urine: 1.023 (ref 1.005–1.030)
pH: 6 (ref 5.0–8.0)

## 2023-07-20 LAB — LIPASE, BLOOD: Lipase: 153 U/L — ABNORMAL HIGH (ref 11–51)

## 2023-07-20 MED ORDER — SODIUM CHLORIDE 0.9% FLUSH
3.0000 mL | Freq: Two times a day (BID) | INTRAVENOUS | Status: DC
  Administered 2023-07-21 – 2023-07-23 (×5): 3 mL via INTRAVENOUS

## 2023-07-20 MED ORDER — LACTATED RINGERS IV SOLN: INTRAVENOUS | Status: AC

## 2023-07-20 MED ORDER — ATORVASTATIN CALCIUM 10 MG PO TABS
20.0000 mg | ORAL_TABLET | Freq: Every day | ORAL | Status: DC
  Administered 2023-07-21 – 2023-07-24 (×4): 20 mg via ORAL
  Filled 2023-07-20 (×4): qty 2

## 2023-07-20 MED ORDER — FLUTICASONE PROPIONATE 50 MCG/ACT NA SUSP
1.0000 | Freq: Every day | NASAL | Status: DC
  Administered 2023-07-21 – 2023-07-22 (×2): 1 via NASAL
  Filled 2023-07-20: qty 16

## 2023-07-20 MED ORDER — MORPHINE SULFATE (PF) 4 MG/ML IV SOLN
4.0000 mg | Freq: Once | INTRAVENOUS | Status: AC
  Administered 2023-07-20: 4 mg via INTRAVENOUS
  Filled 2023-07-20: qty 1

## 2023-07-20 MED ORDER — ACETAMINOPHEN 650 MG RE SUPP: 650.0000 mg | Freq: Four times a day (QID) | RECTAL | Status: DC | PRN

## 2023-07-20 MED ORDER — HYDROMORPHONE HCL 1 MG/ML IJ SOLN
0.5000 mg | INTRAMUSCULAR | Status: DC | PRN
  Administered 2023-07-23 – 2023-07-24 (×4): 1 mg via INTRAVENOUS
  Filled 2023-07-20 (×4): qty 1

## 2023-07-20 MED ORDER — SODIUM CHLORIDE 0.9 % IV SOLN: 250.0000 mL | INTRAVENOUS | Status: AC | PRN

## 2023-07-20 MED ORDER — SODIUM CHLORIDE 0.9% FLUSH: 3.0000 mL | INTRAVENOUS | Status: DC | PRN

## 2023-07-20 MED ORDER — ENOXAPARIN SODIUM 60 MG/0.6ML IJ SOSY
55.0000 mg | PREFILLED_SYRINGE | INTRAMUSCULAR | Status: DC
  Administered 2023-07-22 – 2023-07-23 (×2): 55 mg via SUBCUTANEOUS
  Filled 2023-07-20 (×2): qty 0.6

## 2023-07-20 MED ORDER — LISINOPRIL 20 MG PO TABS
20.0000 mg | ORAL_TABLET | Freq: Every day | ORAL | Status: DC
  Administered 2023-07-21: 20 mg via ORAL
  Filled 2023-07-20: qty 1

## 2023-07-20 MED ORDER — ALBUTEROL SULFATE (2.5 MG/3ML) 0.083% IN NEBU: 2.5000 mg | INHALATION_SOLUTION | Freq: Four times a day (QID) | RESPIRATORY_TRACT | Status: DC | PRN

## 2023-07-20 MED ORDER — ONDANSETRON HCL 4 MG PO TABS
4.0000 mg | ORAL_TABLET | Freq: Four times a day (QID) | ORAL | Status: DC | PRN
  Administered 2023-07-21: 4 mg via ORAL
  Filled 2023-07-20: qty 1

## 2023-07-20 MED ORDER — LISINOPRIL-HYDROCHLOROTHIAZIDE 20-25 MG PO TABS: 1.0000 | ORAL_TABLET | Freq: Every day | ORAL | Status: DC

## 2023-07-20 MED ORDER — FAMOTIDINE 20 MG PO TABS
20.0000 mg | ORAL_TABLET | Freq: Every day | ORAL | Status: DC
  Administered 2023-07-21 – 2023-07-23 (×3): 20 mg via ORAL
  Filled 2023-07-20 (×3): qty 1

## 2023-07-20 MED ORDER — HYDROCHLOROTHIAZIDE 25 MG PO TABS
25.0000 mg | ORAL_TABLET | Freq: Every day | ORAL | Status: DC
  Administered 2023-07-21 – 2023-07-24 (×4): 25 mg via ORAL
  Filled 2023-07-20 (×4): qty 1

## 2023-07-20 MED ORDER — ACETAMINOPHEN 325 MG PO TABS
650.0000 mg | ORAL_TABLET | Freq: Four times a day (QID) | ORAL | Status: DC | PRN
  Administered 2023-07-22: 650 mg via ORAL
  Filled 2023-07-20: qty 2

## 2023-07-20 MED ORDER — IOHEXOL 350 MG/ML SOLN
75.0000 mL | Freq: Once | INTRAVENOUS | Status: AC | PRN
Start: 2023-07-20 — End: 2023-07-20
  Administered 2023-07-20: 75 mL via INTRAVENOUS

## 2023-07-20 MED ORDER — LACTATED RINGERS IV BOLUS
1000.0000 mL | Freq: Once | INTRAVENOUS | Status: AC
  Administered 2023-07-20: 1000 mL via INTRAVENOUS

## 2023-07-20 MED ORDER — ONDANSETRON HCL 4 MG/2ML IJ SOLN
4.0000 mg | Freq: Once | INTRAMUSCULAR | Status: AC
  Administered 2023-07-20: 4 mg via INTRAVENOUS
  Filled 2023-07-20: qty 2

## 2023-07-20 MED ORDER — ONDANSETRON HCL 4 MG/2ML IJ SOLN
4.0000 mg | Freq: Four times a day (QID) | INTRAMUSCULAR | Status: DC | PRN
  Administered 2023-07-23 (×2): 4 mg via INTRAVENOUS
  Filled 2023-07-20: qty 2

## 2023-07-20 NOTE — ED Provider Triage Note (Signed)
 Emergency Medicine Provider Triage Evaluation Note  Marissa Cox , a 63 y.o. female  was evaluated in triage.  Pt complains of epigastric abdominal pain that radiates into her back.  Has history of pancreatitis.  Symptoms started Saturday.    Review of Systems  Positive: As above Negative: As above  Physical Exam  BP (!) 151/73 (BP Location: Right Arm)   Pulse 65   Temp 98.6 F (37 C)   Resp 18   Ht 5' 7 (1.702 m)   Wt 113.4 kg   SpO2 100%   BMI 39.16 kg/m  Gen:   Awake, no distress   Resp:  Normal effort  MSK:   Moves extremities without difficulty  Other:    Medical Decision Making  Medically screening exam initiated at 2:53 PM.  Appropriate orders placed.  Marissa Cox was informed that the remainder of the evaluation will be completed by another provider, this initial triage assessment does not replace that evaluation, and the importance of remaining in the ED until their evaluation is complete.    Marissa Loge, PA-C 07/20/23 1454

## 2023-07-20 NOTE — ED Notes (Signed)
outside

## 2023-07-20 NOTE — ED Provider Notes (Signed)
 [facility] Provider Note   CSN: [id number] Arrival date & time: 07/20/23  1427     History  Chief Complaint  Patient presents with   Abdominal Pain    Marissa Cox is a 63 y.o. female with PMH as listed below who presents with abdominal pain since Saturday that is in her epigastric region, wraps around her right upper quadrant into her back.  Similar to previous 2 episodes of pancreatitis.  Nausea with no vomiting.  No fever/chills, urinary symptoms, vaginal symptoms, hematochezia/melena.  Has not been able to eat anything due to nausea.  Drinks alcohol socially but not increased recently.  Never diagnosed with any cholelithiasis prior.  Per chart review had CT scan on 08/24/2022 when in the ER diagnosis pancreatitis which showed the acute uncomplicated pancreatitis without any necrosis or fluid collection, also did not show any hepatobiliary disease on that CT.   Past Medical History:  Diagnosis Date   Allergy    Anemia    Arthritis    not diagnosed - in knees per pt    COPD (chronic obstructive pulmonary disease) (HCC)    ? per PCP- ref to pulmonary    GERD (gastroesophageal reflux disease)    occ    Hyperlipidemia    Hypertension    Morbid obesity (HCC)    PONV (postoperative nausea and vomiting)        Home Medications Prior to Admission medications   Medication Sig Start Date End Date Taking? Authorizing Provider  albuterol  (PROVENTIL ) (2.5 MG/3ML) 0.083% nebulizer solution Take 3 mLs (1 vial) by nebulization every 6 (six) hours as needed. 10/14/20     albuterol  (VENTOLIN  HFA) 108 (90 Base) MCG/ACT inhaler Inhale 1 puff into the lungs every 4 (four) hours. 05/06/20     albuterol  (VENTOLIN  HFA) 108 (90 Base) MCG/ACT inhaler Inhale 2 puffs into the lungs every 6 (six) hours as needed. 12/07/22     atorvastatin  (LIPITOR) 20 MG tablet Take 1 tablet (20 mg total) by mouth daily. 03/24/23     benzonatate  (TESSALON ) 100  MG capsule Take 1 capsule (100 mg total) by mouth 3 (three) times daily as needed for cough. 11/02/21   Gladis Elsie BROCKS, PA-C  Budeson-Glycopyrrol-Formoterol  (BREZTRI  AEROSPHERE) 160-9-4.8 MCG/ACT AERO Inhale 2 puffs into the lungs 2 (two) times daily. 10/21/21     Budeson-Glycopyrrol-Formoterol  (BREZTRI  AEROSPHERE) 160-9-4.8 MCG/ACT AERO Inhale 2 puffs into the lungs 2 (two) times daily. 11/06/21     Budeson-Glycopyrrol-Formoterol  (BREZTRI  AEROSPHERE) 160-9-4.8 MCG/ACT AERO Inhale 2 puffs into the lungs 2 (two) times daily. 12/07/22     buPROPion  (WELLBUTRIN  XL) 150 MG 24 hr tablet Take 1 tablet (150 mg total) by mouth in the morning. 08/19/22     cyclobenzaprine  (FLEXERIL ) 10 MG tablet Take 1 tablet (10 mg total) by mouth 3 (three) times daily as needed for muscle spasms. 06/05/22   Valdemar Rocky SAUNDERS, NP  famotidine  (PEPCID ) 20 MG tablet Take 1 tablet (20 mg total) by mouth at bedtime. 04/22/23   Kennedy-Smith, Colleen M, NP  lisinopril -hydrochlorothiazide  (ZESTORETIC ) 20-25 MG tablet Take 1 tablet by mouth daily. 10/21/21     lisinopril -hydrochlorothiazide  (ZESTORETIC ) 20-25 MG tablet Take 1 tablet by mouth daily. 11/06/21     lisinopril -hydrochlorothiazide  (ZESTORETIC ) 20-25 MG tablet Take 1 tablet by mouth daily. 12/07/22     nicotine  (NICODERM CQ  - DOSED IN MG/24 HOURS) 14 mg/24hr patch Place 1 patch (14 mg total) onto the skin daily. 04/21/22  ondansetron  (ZOFRAN -ODT) 8 MG disintegrating tablet Take 1 tablet (8 mg total) by mouth every 8 (eight) hours as needed for nausea or vomiting. 08/24/22   Raford Lenis, MD  oxyCODONE -acetaminophen  (PERCOCET) 5-325 MG tablet Take 1 tablet by mouth every 4 (four) hours as needed for moderate pain. 08/24/22   Raford Lenis, MD  potassium chloride  SA (KLOR-CON  M) 20 MEQ tablet Take 1 tablet (20 mEq total) by mouth 2 (two) times daily. 08/24/22   Raford Lenis, MD  predniSONE  (DELTASONE ) 50 MG tablet Take 1 tablet (50 mg total) by mouth daily. 05/19/22   Zamora, Erin R,  NP  Semaglutide -Weight Management (WEGOVY ) 0.5 MG/0.5ML SOAJ Inject 0.5mg  every week by subcutaneous route as directed for 28 days. 02/21/22     Semaglutide -Weight Management (WEGOVY ) 1 MG/0.5ML SOAJ Inject 1 mg into the skin once a week. 02/21/22     hydrochlorothiazide  (HYDRODIURIL ) 25 MG tablet Take 1 tablet (25 mg total) by mouth daily. 10/14/20 08/01/21    lisinopril  (ZESTRIL ) 10 MG tablet Take 1 tablet (10 mg total) by mouth daily. 10/14/20 08/01/21        Allergies    Amoxicillin -pot clavulanate, Other, Tramadol  hcl, and Varenicline     Review of Systems   Review of Systems A 10 point review of systems was performed and is negative unless otherwise reported in HPI.  Physical Exam Updated Vital Signs BP (!) 148/45 (BP Location: Right Arm)   Pulse (!) 58   Temp 98.2 F (36.8 C) (Oral)   Resp 18   Ht 5' 7 (1.702 m)   Wt 113.4 kg   SpO2 99%   BMI 39.16 kg/m  Physical Exam General: Normal appearing female, lying in bed.  HEENT: PERRLA, Sclera anicteric, MMM, trachea midline.  Cardiology: RRR, no murmurs/rubs/gallops. BL radial and DP pulses equal bilaterally.  Resp: Normal respiratory rate and effort. CTAB, no wheezes, rhonchi, crackles.  Abd: Soft, + epigastric and right upper quadrant tenderness palpation, non-distended. No rebound tenderness or guarding.  GU: Deferred. MSK: No peripheral edema or signs of trauma. Extremities without deformity or TTP. No cyanosis or clubbing. Skin: warm, dry. No rashes or lesions. Back: No CVA tenderness Neuro: A&Ox4, CNs II-XII grossly intact. MAEs. Sensation grossly intact.  Psych: Normal mood and affect.   ED Results / Procedures / Treatments   Labs (all labs ordered are listed, but only abnormal results are displayed) Labs Reviewed  LIPASE, BLOOD - Abnormal; Notable for the following components:      Result Value   Lipase 153 (*)    All other components within normal limits  COMPREHENSIVE METABOLIC PANEL WITH GFR - Abnormal; Notable for  the following components:   Glucose, Bld 115 (*)    Creatinine, Ser 1.07 (*)    Albumin 3.4 (*)    GFR, Estimated 58 (*)    All other components within normal limits  CBC - Abnormal; Notable for the following components:   Hemoglobin 11.3 (*)    MCV 77.0 (*)    MCH 23.2 (*)    All other components within normal limits  URINALYSIS, ROUTINE W REFLEX MICROSCOPIC - Abnormal; Notable for the following components:   APPearance HAZY (*)    All other components within normal limits    EKG EKG Interpretation Date/Time:  Tuesday July 20 2023 20:24:02 EDT Ventricular Rate:  64 PR Interval:  184 QRS Duration:  90 QT Interval:  440 QTC Calculation: 453 R Axis:   61  Text Interpretation: Sinus rhythm with Premature atrial complexes  in a pattern of bigeminy Low voltage QRS Confirmed by Franklyn Gills [phone]) on 07/20/2023 9:25:04 PM  Radiology CT ABDOMEN PELVIS W CONTRAST Result Date: 07/20/2023 CLINICAL DATA:  Abdominal pain and nausea for several days, initial encounter EXAM: CT ABDOMEN AND PELVIS WITH CONTRAST TECHNIQUE: Multidetector CT imaging of the abdomen and pelvis was performed using the standard protocol following bolus administration of intravenous contrast. RADIATION DOSE REDUCTION: This exam was performed according to the departmental dose-optimization program which includes automated exposure control, adjustment of the mA and/or kV according to patient size and/or use of iterative reconstruction technique. CONTRAST:  75mL OMNIPAQUE  IOHEXOL  350 MG/ML SOLN COMPARISON:  08/24/2022 FINDINGS: Lower chest: No acute abnormality. Hepatobiliary: No focal liver abnormality is seen. No gallstones, gallbladder wall thickening, or biliary dilatation. Pancreas: Pancreas is well visualized with very mild peripancreatic inflammatory change consistent with acute pancreatitis. No pancreatic necrosis is seen. No mass is noted. Spleen: Normal in size without focal abnormality. Adrenals/Urinary Tract: Adrenal  glands are within normal limits. Kidneys demonstrate a normal enhancement pattern bilaterally. Small cyst is noted within the left kidney. No follow-up is recommended. The collecting systems are within normal limits. Bladder is partially distended. Stomach/Bowel: Scattered diverticular change of the colon is noted without evidence of diverticulitis. The appendix is within normal limits. Small bowel is within normal limits. Postsurgical changes in the stomach are noted consistent with sleeve resection. Vascular/Lymphatic: Aortic atherosclerosis. No enlarged abdominal or pelvic lymph nodes. Reproductive: Status post hysterectomy. No adnexal masses. Other: No abdominal wall hernia or abnormality. No abdominopelvic ascites. Musculoskeletal: No acute or significant osseous findings. IMPRESSION: Mild changes of pancreatitis.  No necrosis is noted. Diverticulosis without diverticulitis. Electronically Signed   By: Oneil Devonshire M.D.   On: 07/20/2023 22:06    Procedures Procedures    Medications Ordered in ED Medications  morphine  (PF) 4 MG/ML injection 4 mg (has no administration in time range)  morphine  (PF) 4 MG/ML injection 4 mg (4 mg Intravenous Given 07/20/23 2131)  lactated ringers  bolus 1,000 mL (0 mLs Intravenous Stopped 07/20/23 2213)  ondansetron  (ZOFRAN ) injection 4 mg (4 mg Intravenous Given 07/20/23 2131)  iohexol  (OMNIPAQUE ) 350 MG/ML injection 75 mL (75 mLs Intravenous Contrast Given 07/20/23 2151)    ED Course/ Medical Decision Making/ A&P                          Medical Decision Making Amount and/or Complexity of Data Reviewed Labs: ordered. Radiology:  Decision-making details documented in ED Course.  Risk Prescription drug management. Decision regarding hospitalization.    This patient presents to the ED for concern of abd pain, this involves an extensive number of treatment options, and is a complaint that carries with it a high risk of complications and morbidity.  I  considered the following differential and admission for this acute, potentially life threatening condition.   MDM:    Patient with epigastric and right upper quadrant pain associated with nausea, similar to prior episodes of pancreatitis.  Labs without any leukocytosis, no UTI on UA, no significant electrolyte derangements, and renal function at baseline.  Lipase elevated to 153, positive pancreatitis.  Given Zofran , morphine , fluids. Doesn't drink alcohol, no h/o cholelithiasis. CT here shows pancreatitis.  Clinical Course as of 07/20/23 2243  Tue Jul 20, 2023  2209 CT ABDOMEN PELVIS W CONTRAST Mild changes of pancreatitis.  No necrosis is noted. Diverticulosis without diverticulitis. [HN]  2241 Patient w/ persistent pain. Discussed admission v discharge and  performed shared decision making, patient states this episode is worse than last time and would prefer admission. Given additional IV analgesia and will admit to medicine. [HN]    Clinical Course User Index [HN] Franklyn Sid SAILOR, MD    Labs: I Ordered, and personally interpreted labs.  The pertinent results include: Those listed above  Imaging Studies ordered: CT abdomen pelvis ordered from triage I independently visualized and interpreted imaging. I agree with the radiologist interpretation  Additional history obtained from chart review.   Reevaluation: After the interventions noted above, I reevaluated the patient and found that they have :improved  Social Determinants of Health:  lives independently  Disposition:  Admit to medicine  Co morbidities that complicate the patient evaluation  Past Medical History:  Diagnosis Date   Allergy    Anemia    Arthritis    not diagnosed - in knees per pt    COPD (chronic obstructive pulmonary disease) (HCC)    ? per PCP- ref to pulmonary    GERD (gastroesophageal reflux disease)    occ    Hyperlipidemia    Hypertension    Morbid obesity (HCC)    PONV (postoperative nausea  and vomiting)      Medicines Meds ordered this encounter  Medications   morphine  (PF) 4 MG/ML injection 4 mg   lactated ringers  bolus 1,000 mL   ondansetron  (ZOFRAN ) injection 4 mg   iohexol  (OMNIPAQUE ) 350 MG/ML injection 75 mL   morphine  (PF) 4 MG/ML injection 4 mg    I have reviewed the patients home medicines and have made adjustments as needed  Problem List / ED Course: Problem List Items Addressed This Visit   None Visit Diagnoses       Acute pancreatitis without infection or necrosis, unspecified pancreatitis type    -  Primary   Relevant Medications   morphine  (PF) 4 MG/ML injection 4 mg (Completed)   morphine  (PF) 4 MG/ML injection 4 mg (Start on 07/20/2023 10:45 PM)                   This note was created using dictation software, which may contain spelling or grammatical errors.    Franklyn Sid SAILOR, MD 07/20/23 [phone]

## 2023-07-20 NOTE — ED Triage Notes (Signed)
 Patient reports abd pain and nausea and loss of appetitie since Saturday.  States  I think  I have pancreatitis again

## 2023-07-20 NOTE — H&P (Signed)
 History and Physical    Marissa Cox FMW:[id number] DOB: 1960-11-22 DOA: 07/20/2023  PCP: Elliot Charm, MD   Patient coming from: Home   Chief Complaint:  Chief Complaint  Patient presents with   Abdominal Pain   ED TRIAGE note:Patient reports abd pain and nausea and loss of appetitie since Saturday. States  I think I have pancreatitis again   HPI:  Marissa Cox is a 63 y.o. female with medical history significant of lumbar radiculopathy, bilateral osteoarthritis, anxiety, depression, adjustment disorder, COPD, hyperlipidemia, essential hypertension, iron deficiency anemia, history of recurrent pancreatitis, and chronic smoking cigarette presented to emergency department complaining of abdominal pain since Saturday.  Patient reported midepigastric abdominal pain wraps around right upper quadrant and radiates to the back.  Patient reported she has previous episodes of pancreatitis.  Reported associated nausea without any vomiting.  Denies any fever and chill, hematochezia, melena, and UTI symptoms.  Reported she is a social drinker.   ED Course:  At presentation to ED patient is hemodynamically stable. CBC unremarkable stable H&H low MCV and normal platelet count. CMP showing creatinine 1.07 and GFR 58.  No patient at baseline.  Normal hepatic function panel.  Elevated lipase level 153.  CT abdomen pelvis showed mild changes of pancreatitis without any necrosis.  Diverticulosis without diverticulitis.  No evidence of cholelithiasis or biliary dilation.  In the ED patient has been given morphine  and 1 L of LR bolus.  Hospitalist has been consulted for further evaluation management of acute pancreatitis.  Consulted and informed [facility] Dr. Legrand.   Significant labs in the ED: Lab Orders         Lipase, blood         Comprehensive metabolic panel         CBC         Urinalysis, Routine w reflex microscopic -Urine, Clean Catch          Triglycerides         HIV Antibody (routine testing w rflx)         Comprehensive metabolic panel         CBC       Review of Systems:  Review of Systems  Constitutional:  Negative for chills, fever, malaise/fatigue and weight loss.  Respiratory:  Negative for cough, sputum production and shortness of breath.   Cardiovascular:  Negative for chest pain, palpitations, orthopnea and leg swelling.  Gastrointestinal:  Positive for abdominal pain. Negative for blood in stool, constipation, diarrhea, heartburn, melena, nausea and vomiting.  Genitourinary:  Negative for dysuria and urgency.  Neurological:  Negative for dizziness and headaches.  Psychiatric/Behavioral:  The patient is not nervous/anxious.     Past Medical History:  Diagnosis Date   Allergy    Anemia    Arthritis    not diagnosed - in knees per pt    COPD (chronic obstructive pulmonary disease) (HCC)    ? per PCP- ref to pulmonary    GERD (gastroesophageal reflux disease)    occ    Hyperlipidemia    Hypertension    Morbid obesity (HCC)    PONV (postoperative nausea and vomiting)     Past Surgical History:  Procedure Laterality Date   COLONOSCOPY     LAPAROSCOPIC GASTRIC SLEEVE RESECTION  12/22/2011   Procedure: LAPAROSCOPIC GASTRIC SLEEVE RESECTION;  Surgeon: Redell Faith, DO;  Location: [facility];  Service: General;  Laterality: N/A;   LAPAROSCOPIC TOTAL HYSTERECTOMY  1990   MULTIPLE TOOTH EXTRACTIONS  1981   TUBAL LIGATION  1990   UPPER GASTROINTESTINAL ENDOSCOPY     UPPER GI ENDOSCOPY  12/22/2011   Procedure: UPPER GI ENDOSCOPY;  Surgeon: Redell Faith, DO;  Location: [facility];  Service: General;;     reports that she has been smoking cigarettes. She quit smokeless tobacco use about 11 years ago. She reports current alcohol use. She reports that she does not use drugs.  Allergies  Allergen Reactions   Amoxicillin -Pot Clavulanate     Other Reaction(s): diarrhea/yeast   Tramadol  Hcl     Other reaction(s):  vomiting   Varenicline  Other (See Comments)    Chantix  - nightmares    Family History  Problem Relation Age of Onset   Bladder Cancer Mother    Breast cancer Maternal Aunt    Breast cancer Maternal Aunt    Ovarian cancer Maternal Aunt    Breast cancer Paternal Aunt    Breast cancer Paternal Aunt    Breast cancer Paternal Aunt    Colon cancer Neg Hx    Colon polyps Neg Hx    Esophageal cancer Neg Hx    Rectal cancer Neg Hx    Stomach cancer Neg Hx     Prior to Admission medications   Medication Sig Start Date End Date Taking? Authorizing Provider  albuterol  (PROVENTIL ) (2.5 MG/3ML) 0.083% nebulizer solution Take 3 mLs (1 vial) by nebulization every 6 (six) hours as needed. 10/14/20  Yes   atorvastatin  (LIPITOR) 20 MG tablet Take 1 tablet (20 mg total) by mouth daily. 03/24/23  Yes   Cholecalciferol (VITAMIN D3 PO) Take 1 tablet by mouth daily.   Yes [provider]  famotidine  (PEPCID ) 20 MG tablet Take 1 tablet (20 mg total) by mouth at bedtime. 04/22/23  Yes Kennedy-Smith, Colleen M, NP  fluticasone  (FLONASE ) 50 MCG/ACT nasal spray Place 1 spray into both nostrils daily.   Yes [provider]  lisinopril -hydrochlorothiazide  (ZESTORETIC ) 20-25 MG tablet Take 1 tablet by mouth daily. 12/07/22  Yes   Multiple Vitamin (MULTIVITAMIN WITH MINERALS) TABS tablet Take 1 tablet by mouth daily.   Yes [provider]  ondansetron  (ZOFRAN -ODT) 8 MG disintegrating tablet Take 1 tablet (8 mg total) by mouth every 8 (eight) hours as needed for nausea or vomiting. 08/24/22  Yes Raford Lenis, MD  albuterol  (VENTOLIN  HFA) 108 (90 Base) MCG/ACT inhaler Inhale 1 puff into the lungs every 4 (four) hours. 05/06/20     albuterol  (VENTOLIN  HFA) 108 (90 Base) MCG/ACT inhaler Inhale 2 puffs into the lungs every 6 (six) hours as needed. Patient not taking: Reported on 07/20/2023 12/07/22     benzonatate  (TESSALON ) 100 MG capsule Take 1 capsule (100 mg total) by mouth 3 (three) times daily  as needed for cough. 11/02/21   Gladis Elsie BROCKS, PA-C  Budeson-Glycopyrrol-Formoterol  (BREZTRI  AEROSPHERE) 160-9-4.8 MCG/ACT AERO Inhale 2 puffs into the lungs 2 (two) times daily. Patient not taking: Reported on 07/20/2023 12/07/22     buPROPion  (WELLBUTRIN  XL) 150 MG 24 hr tablet Take 1 tablet (150 mg total) by mouth in the morning. Patient not taking: Reported on 07/20/2023 08/19/22     lisinopril -hydrochlorothiazide  (ZESTORETIC ) 20-25 MG tablet Take 1 tablet by mouth daily. Patient not taking: Reported on 07/20/2023 11/06/21     nicotine  (NICODERM CQ  - DOSED IN MG/24 HOURS) 14 mg/24hr patch Place 1 patch (14 mg total) onto the skin daily. Patient not taking: Reported on 07/20/2023 04/21/22     potassium chloride  SA (KLOR-CON  M) 20 MEQ tablet Take  1 tablet (20 mEq total) by mouth 2 (two) times daily. 08/24/22   Raford Lenis, MD  hydrochlorothiazide  (HYDRODIURIL ) 25 MG tablet Take 1 tablet (25 mg total) by mouth daily. 10/14/20 08/01/21    lisinopril  (ZESTRIL ) 10 MG tablet Take 1 tablet (10 mg total) by mouth daily. 10/14/20 08/01/21       Physical Exam: Vitals:   07/20/23 1447 07/20/23 1827 07/20/23 2213 07/20/23 2359  BP:  (!) 153/58 (!) 148/45 (!) 142/60  Pulse:  (!) 55 (!) 58 (!) 55  Resp:  15 18 18   Temp:  98.4 F (36.9 C) 98.2 F (36.8 C) 98 F (36.7 C)  TempSrc:  Oral Oral   SpO2:  99% 99% 100%  Weight: 113.4 kg     Height: 5' 7 (1.702 m)       Physical Exam Vitals and nursing note reviewed.  Constitutional:      Appearance: She is well-developed. She is obese.   Cardiovascular:     Rate and Rhythm: Normal rate and regular rhythm.     Heart sounds: Normal heart sounds.  Abdominal:     General: Abdomen is flat. Bowel sounds are normal. There is no distension.     Palpations: Abdomen is soft. There is no mass.     Tenderness: There is abdominal tenderness in the epigastric area. There is no guarding or rebound. Negative signs include Murphy's sign.     Hernia: No hernia is  present.   Skin:    General: Skin is dry.     Capillary Refill: Capillary refill takes less than 2 seconds.   Neurological:     Mental Status: She is alert and oriented to person, place, and time.   Psychiatric:        Mood and Affect: Mood normal.      Labs on Admission: I have personally reviewed following labs and imaging studies  CBC: Recent Labs  Lab 07/20/23 1449  WBC 6.4  HGB 11.3*  HCT 37.5  MCV 77.0*  PLT 249   Basic Metabolic Panel: Recent Labs  Lab 07/20/23 1449  NA 137  K 3.6  CL 103  CO2 24  GLUCOSE 115*  BUN 12  CREATININE 1.07*  CALCIUM  9.3   GFR: Estimated Creatinine Clearance: 69.9 mL/min (A) (by C-G formula based on SCr of 1.07 mg/dL (H)). Liver Function Tests: Recent Labs  Lab 07/20/23 1449  AST 22  ALT 16  ALKPHOS 82  BILITOT 0.6  PROT 7.0  ALBUMIN 3.4*   Recent Labs  Lab 07/20/23 1449  LIPASE 153*   No results for input(s): AMMONIA in the last 168 hours. Coagulation Profile: No results for input(s): INR, PROTIME in the last 168 hours. Cardiac Enzymes: No results for input(s): CKTOTAL, CKMB, CKMBINDEX, TROPONINI, TROPONINIHS in the last 168 hours. BNP (last 3 results) No results for input(s): BNP in the last 8760 hours. HbA1C: No results for input(s): HGBA1C in the last 72 hours. CBG: No results for input(s): GLUCAP in the last 168 hours. Lipid Profile: No results for input(s): CHOL, HDL, LDLCALC, TRIG, CHOLHDL, LDLDIRECT in the last 72 hours. Thyroid  Function Tests: No results for input(s): TSH, T4TOTAL, FREET4, T3FREE, THYROIDAB in the last 72 hours. Anemia Panel: No results for input(s): VITAMINB12, FOLATE, FERRITIN, TIBC, IRON, RETICCTPCT in the last 72 hours. Urine analysis:    Component Value Date/Time   COLORURINE YELLOW 07/20/2023 1449   APPEARANCEUR HAZY (A) 07/20/2023 1449   LABSPEC 1.023 07/20/2023 1449   PHURINE 6.0 07/20/2023  1449   GLUCOSEU  NEGATIVE 07/20/2023 1449   HGBUR NEGATIVE 07/20/2023 1449   BILIRUBINUR NEGATIVE 07/20/2023 1449   KETONESUR NEGATIVE 07/20/2023 1449   PROTEINUR NEGATIVE 07/20/2023 1449   UROBILINOGEN 2.0 (H) 04/14/2016 1926   NITRITE NEGATIVE 07/20/2023 1449   LEUKOCYTESUR NEGATIVE 07/20/2023 1449    Radiological Exams on Admission: I have personally reviewed images CT ABDOMEN PELVIS W CONTRAST Result Date: 07/20/2023 CLINICAL DATA:  Abdominal pain and nausea for several days, initial encounter EXAM: CT ABDOMEN AND PELVIS WITH CONTRAST TECHNIQUE: Multidetector CT imaging of the abdomen and pelvis was performed using the standard protocol following bolus administration of intravenous contrast. RADIATION DOSE REDUCTION: This exam was performed according to the departmental dose-optimization program which includes automated exposure control, adjustment of the mA and/or kV according to patient size and/or use of iterative reconstruction technique. CONTRAST:  75mL OMNIPAQUE  IOHEXOL  350 MG/ML SOLN COMPARISON:  08/24/2022 FINDINGS: Lower chest: No acute abnormality. Hepatobiliary: No focal liver abnormality is seen. No gallstones, gallbladder wall thickening, or biliary dilatation. Pancreas: Pancreas is well visualized with very mild peripancreatic inflammatory change consistent with acute pancreatitis. No pancreatic necrosis is seen. No mass is noted. Spleen: Normal in size without focal abnormality. Adrenals/Urinary Tract: Adrenal glands are within normal limits. Kidneys demonstrate a normal enhancement pattern bilaterally. Small cyst is noted within the left kidney. No follow-up is recommended. The collecting systems are within normal limits. Bladder is partially distended. Stomach/Bowel: Scattered diverticular change of the colon is noted without evidence of diverticulitis. The appendix is within normal limits. Small bowel is within normal limits. Postsurgical changes in the stomach are noted consistent with sleeve  resection. Vascular/Lymphatic: Aortic atherosclerosis. No enlarged abdominal or pelvic lymph nodes. Reproductive: Status post hysterectomy. No adnexal masses. Other: No abdominal wall hernia or abnormality. No abdominopelvic ascites. Musculoskeletal: No acute or significant osseous findings. IMPRESSION: Mild changes of pancreatitis.  No necrosis is noted. Diverticulosis without diverticulitis. Electronically Signed   By: Oneil Devonshire M.D.   On: 07/20/2023 22:06     EKG: My personal interpretation of EKG shows: Normal sinus rhythm    Assessment/Plan: Principal Problem:   Acute pancreatitis Active Problems:   COPD (chronic obstructive pulmonary disease) (HCC)   Hyperlipidemia   Essential hypertension   Iron deficiency anemia   GAD (generalized anxiety disorder)   Continuous dependence on cigarette smoking    Assessment and Plan: Acute pancreatitis History of recurrent pancreatitis-third episode -Presented emergency department complaining of midepigastric abdominal tenderness which radiates  on both sides and radiates to right-sided back as a bandlike sensation.  Patient reported she has 2 episodes of previous pancreatitis in the past.  Has been seen by [facility] outpatient. -At presentation to ED hemodynamically stable.  CMP showing normal hepatic panel.  Elevated lipase 153.  CBC no evidence of leukocytosis stable H&H and low MCV. -CT abdomen pelvis showed evidence for acute pancreatitis without any necrosis.  Diverticulosis without diverticulitis.  No evidence of cholelithiasis/cholecystitis/biliary ductal dilation. - Patient reported she is a social drinker and never been heavy drinker in lifetime. - Unclear etiology of acute pancreatitis at this time given there is no evidence of gallstone neither patient is a heavy drinker. - Checking triglyceride level. - Continue clear liquid diet, pain management and aggressive hydration with LR 150 cc/h. - Consulted [facility] Dr.  Legrand.  History of COPD -Stable.  Continue albuterol  as needed  Hyperlipidemia -Continue Lipitor  Essential hypertension -Continue lisinopril  hydrochlorothiazide .  Chronic iron deficiency anemia  -Continue oral iron  supplement  Generalized anxiety disorder -Not taking any antianxiety medication anymore  History of smoking cigarette Reported quit smoking few years ago.   DVT prophylaxis:  Lovenox  Code Status:  Full Code Diet: Clear liquid diet. Disposition Plan: Continue monitor improvement abdominal pain and transition to solid food as patient tolerates. Consults: Gastroenterology Admission status:   Observation, Telemetry bed  Severity of Illness: The appropriate patient status for this patient is OBSERVATION. Observation status is judged to be reasonable and necessary in order to provide the required intensity of service to ensure the patient's safety. The patient's presenting symptoms, physical exam findings, and initial radiographic and laboratory data in the context of their medical condition is felt to place them at decreased risk for further clinical deterioration. Furthermore, it is anticipated that the patient will be medically stable for discharge from the hospital within 2 midnights of admission.     Vernal Rutan, MD [facility]  How to contact the [facility] Attending or Consulting provider 7A - 7P or covering provider during after hours 7P -7A, for this patient.  Check the care team in [facility] and look for a) attending/consulting [facility] provider listed and b) the [facility] team listed Log into [email or web address] and use [facility] universal password to access. If you do not have the password, please contact the hospital operator. Locate the [facility] provider you are looking for under [facility] and page to a number that you can be directly reached. If you still have difficulty reaching the provider, please page the [facility] (Director on Call) for the Hospitalists listed on  [email or web address] for assistance.  07/21/2023, 12:27 AM

## 2023-07-21 ENCOUNTER — Inpatient Hospital Stay (HOSPITAL_COMMUNITY)

## 2023-07-21 DIAGNOSIS — K859 Acute pancreatitis without necrosis or infection, unspecified: Secondary | ICD-10-CM | POA: Diagnosis not present

## 2023-07-21 DIAGNOSIS — K8591 Acute pancreatitis with uninfected necrosis, unspecified: Secondary | ICD-10-CM | POA: Diagnosis not present

## 2023-07-21 DIAGNOSIS — I700 Atherosclerosis of aorta: Secondary | ICD-10-CM | POA: Diagnosis not present

## 2023-07-21 DIAGNOSIS — Z9884 Bariatric surgery status: Secondary | ICD-10-CM | POA: Diagnosis not present

## 2023-07-21 DIAGNOSIS — Z79899 Other long term (current) drug therapy: Secondary | ICD-10-CM | POA: Diagnosis not present

## 2023-07-21 DIAGNOSIS — D509 Iron deficiency anemia, unspecified: Secondary | ICD-10-CM

## 2023-07-21 DIAGNOSIS — K219 Gastro-esophageal reflux disease without esophagitis: Secondary | ICD-10-CM | POA: Diagnosis not present

## 2023-07-21 DIAGNOSIS — K838 Other specified diseases of biliary tract: Secondary | ICD-10-CM | POA: Diagnosis not present

## 2023-07-21 DIAGNOSIS — E785 Hyperlipidemia, unspecified: Secondary | ICD-10-CM | POA: Diagnosis not present

## 2023-07-21 DIAGNOSIS — D3502 Benign neoplasm of left adrenal gland: Secondary | ICD-10-CM | POA: Diagnosis not present

## 2023-07-21 DIAGNOSIS — F17210 Nicotine dependence, cigarettes, uncomplicated: Secondary | ICD-10-CM | POA: Diagnosis not present

## 2023-07-21 DIAGNOSIS — K5730 Diverticulosis of large intestine without perforation or abscess without bleeding: Secondary | ICD-10-CM | POA: Diagnosis present

## 2023-07-21 DIAGNOSIS — Z860101 Personal history of adenomatous and serrated colon polyps: Secondary | ICD-10-CM | POA: Diagnosis not present

## 2023-07-21 DIAGNOSIS — K811 Chronic cholecystitis: Secondary | ICD-10-CM | POA: Diagnosis not present

## 2023-07-21 DIAGNOSIS — D7589 Other specified diseases of blood and blood-forming organs: Secondary | ICD-10-CM | POA: Diagnosis not present

## 2023-07-21 DIAGNOSIS — K861 Other chronic pancreatitis: Secondary | ICD-10-CM | POA: Diagnosis not present

## 2023-07-21 DIAGNOSIS — Z7985 Long-term (current) use of injectable non-insulin antidiabetic drugs: Secondary | ICD-10-CM | POA: Diagnosis not present

## 2023-07-21 DIAGNOSIS — I10 Essential (primary) hypertension: Secondary | ICD-10-CM | POA: Diagnosis not present

## 2023-07-21 DIAGNOSIS — Z87891 Personal history of nicotine dependence: Secondary | ICD-10-CM | POA: Diagnosis not present

## 2023-07-21 DIAGNOSIS — J449 Chronic obstructive pulmonary disease, unspecified: Secondary | ICD-10-CM | POA: Diagnosis not present

## 2023-07-21 DIAGNOSIS — K8500 Idiopathic acute pancreatitis without necrosis or infection: Secondary | ICD-10-CM | POA: Diagnosis not present

## 2023-07-21 DIAGNOSIS — Z7952 Long term (current) use of systemic steroids: Secondary | ICD-10-CM | POA: Diagnosis not present

## 2023-07-21 DIAGNOSIS — Z90710 Acquired absence of both cervix and uterus: Secondary | ICD-10-CM | POA: Diagnosis not present

## 2023-07-21 LAB — COMPREHENSIVE METABOLIC PANEL WITH GFR
ALT: 16 U/L (ref 0–44)
AST: 19 U/L (ref 15–41)
Albumin: 3.2 g/dL — ABNORMAL LOW (ref 3.5–5.0)
Alkaline Phosphatase: 75 U/L (ref 38–126)
Anion gap: 13 (ref 5–15)
BUN: 11 mg/dL (ref 8–23)
CO2: 23 mmol/L (ref 22–32)
Calcium: 9 mg/dL (ref 8.9–10.3)
Chloride: 103 mmol/L (ref 98–111)
Creatinine, Ser: 1.12 mg/dL — ABNORMAL HIGH (ref 0.44–1.00)
GFR, Estimated: 55 mL/min — ABNORMAL LOW (ref >60)
Glucose, Bld: 99 mg/dL (ref 70–99)
Potassium: 3.6 mmol/L (ref 3.5–5.1)
Sodium: 139 mmol/L (ref 135–145)
Total Bilirubin: 0.6 mg/dL (ref 0.0–1.2)
Total Protein: 6.5 g/dL (ref 6.5–8.1)

## 2023-07-21 LAB — CBC
HCT: 35 % — ABNORMAL LOW (ref 36.0–46.0)
Hemoglobin: 10.6 g/dL — ABNORMAL LOW (ref 12.0–15.0)
MCH: 23.2 pg — ABNORMAL LOW (ref 26.0–34.0)
MCHC: 30.3 g/dL (ref 30.0–36.0)
MCV: 76.8 fL — ABNORMAL LOW (ref 80.0–100.0)
Platelets: 212 10*3/uL (ref 150–400)
RBC: 4.56 MIL/uL (ref 3.87–5.11)
RDW: 13.9 % (ref 11.5–15.5)
WBC: 6.1 10*3/uL (ref 4.0–10.5)
nRBC: 0 % (ref 0.0–0.2)

## 2023-07-21 LAB — HIV ANTIBODY (ROUTINE TESTING W REFLEX): HIV Screen 4th Generation wRfx: NONREACTIVE

## 2023-07-21 LAB — TRIGLYCERIDES: Triglycerides: 63 mg/dL (ref <150)

## 2023-07-21 MED ORDER — GADOBUTROL 1 MMOL/ML IV SOLN
10.0000 mL | Freq: Once | INTRAVENOUS | Status: AC | PRN
  Administered 2023-07-21: 10 mL via INTRAVENOUS

## 2023-07-21 MED ORDER — POLYETHYLENE GLYCOL 3350 17 G PO PACK: 17.0000 g | PACK | Freq: Every day | ORAL | Status: DC | PRN

## 2023-07-21 MED ORDER — FERROUS SULFATE 325 (65 FE) MG PO TABS
325.0000 mg | ORAL_TABLET | Freq: Every day | ORAL | Status: DC
  Administered 2023-07-21 – 2023-07-24 (×4): 325 mg via ORAL
  Filled 2023-07-21 (×4): qty 1

## 2023-07-21 MED ORDER — LORAZEPAM 2 MG/ML IJ SOLN
0.5000 mg | Freq: Once | INTRAMUSCULAR | Status: AC
  Administered 2023-07-21: 0.5 mg via INTRAVENOUS
  Filled 2023-07-21: qty 1

## 2023-07-21 NOTE — Plan of Care (Signed)

## 2023-07-21 NOTE — Plan of Care (Signed)

## 2023-07-21 NOTE — Consult Note (Addendum)
 Consultation Note   Referring Provider:  [facility] PCP: Elliot Charm, MD Primary Gastroenterologist:    Gordy Starch, MD   Reason for Consultation:  pancreatitis  DOA: 07/20/2023         Hospital Day: 2   ASSESSMENT    63 year old female with recurrent ( 3rd episode) of acute uncomplicated pancreatitis of unclear etiology.  Prior episodes in 2023 and 2024. Etiology unclear.  I think Etoh is not a factor.  No evidence for biliary causes or pancreatic masses. Trig nl, serum Ca+ normal. Medication related?   She takes Zestril  which has been associated with pancreatitis .  Additionally , she has been taking Zestoretic  (contains hydrochlorothiazide ) and a statin for years. Both have rarely been associated with pancreatitis. Today; Seems to be volume resusitated. Appears she received 1 L bolus of LR and now getting 150 ml /hr. HCt is good at 35%. Creatinine up slightly but it has been intermittently elevated for years ( ? CKD)  Chronic anemia with microcytosis Hemoglobin 10.6 today, down from baseline of 12.1 though probably dilutional after IVF. She is no longer taking iron due to constipation  History of colon polyps  Sessile serrated and TAs on last colonoscopy in 2022.   See PMH for additional history  Principal Problem:   Acute pancreatitis Active Problems:   COPD (chronic obstructive pulmonary disease) (HCC)   Hyperlipidemia   Essential hypertension   Iron deficiency anemia   GAD (generalized anxiety disorder)   Continuous dependence on cigarette smoking     PLAN:   --Continue IV hydration, analgesics as needed and antiemetics as needed.  --Continue clear liquids for now.  May possibly advance diet tomorrow if improved.  --Evaluation for autoimmune pancreatitis with IgG4 -- Consider surgical evaluation for empiric cholecystectomy given this is her third episode without clear-cut cause  HPI   63 y.o. year old  female with a medical history including but not limited to hypertension, HLD, COPD, chronic iron deficiency anemia, GERD, colon polyps, GIST, sleeve gastrectomy, recurrent acute pancreatitis , adjustment disorder, anxiety/depression,   Brief history  patient has a history of acute pancreatitis for which we saw her in the office 10/29/2021, refer to that note for details.  She has been to the ED for abdominal pain and CT scan did demonstrate subtle inflammatory type changes surrounding the pancreatic head felt to be mild uncomplicated pancreatitis.  We ordered an MRI/MRCP which was unrevealing.  She was seen in the ED again 08/24/2022 with abdominal pain .  RUQ ultrasound negative for gallstones or biliary duct dilation .  CT scan again demonstrated acute uncomplicated pancreatitis.  Patient did not want to be admitted.   Interval history Several days ago patient developed RUQ pain and nausea reminiscent of when she had pancreatitis . The abdominal pain was located in RUQ and wrapped around her side into her back.  This is where her pain with pancreatitis is always located . Initially the pain was intermittent and tolerable.  It waxed and waned for a few days but then escalated significantly Monday evening so came to ED.   Workup notable for : hemodynamically stable.  WBC 6.4, hemoglobin 11.3, hematocrit 37 , MCV 77 , lipase 153, normal LFTs, mildly elevated  creatinine at 1.07, normal serum calcium , triglycerides 63 CTAP with contrast Mild changes of pancreatitis.  No necrosis.  No gallbladder pathology or biliary duct dilation  Update Reviewed patient's medication list with her.  A couple of her meds have rarely been associated with pancreatitis .  She has taken these meds (statin and HCTZ containing med ) for many years .  No recent medication changes  /additions .  Patient has 1 alcoholic beverage maybe every 3 months .  She has no known family history of pancreatic diseases/cancer .  She does smoke  cigarettes.  No other GI complaints.  She is no longer taking oral iron due to constipation.  Off of iron her bowel movements are normal.  No blood in stool   tolerating small clear liquids today overall the upper abdominal pain has improved.    Labs and Imaging:  Recent Labs    07/20/23 1449 07/21/23 0107  PROT 7.0 6.5  ALBUMIN 3.4* 3.2*  AST 22 19  ALT 16 16  ALKPHOS 82 75  BILITOT 0.6 0.6   Recent Labs    07/20/23 1449 07/21/23 0107  WBC 6.4 6.1  HGB 11.3* 10.6*  HCT 37.5 35.0*  MCV 77.0* 76.8*  PLT 249 212   Recent Labs    07/20/23 1449 07/21/23 0107  NA 137 139  K 3.6 3.6  CL 103 103  CO2 24 23  GLUCOSE 115* 99  BUN 12 11  CREATININE 1.07* 1.12*  CALCIUM  9.3 9.0     CT ABDOMEN PELVIS W CONTRAST CLINICAL DATA:  Abdominal pain and nausea for several days, initial encounter  EXAM: CT ABDOMEN AND PELVIS WITH CONTRAST  TECHNIQUE: Multidetector CT imaging of the abdomen and pelvis was performed using the standard protocol following bolus administration of intravenous contrast.  RADIATION DOSE REDUCTION: This exam was performed according to the departmental dose-optimization program which includes automated exposure control, adjustment of the mA and/or kV according to patient size and/or use of iterative reconstruction technique.  CONTRAST:  75mL OMNIPAQUE  IOHEXOL  350 MG/ML SOLN  COMPARISON:  08/24/2022  FINDINGS: Lower chest: No acute abnormality.  Hepatobiliary: No focal liver abnormality is seen. No gallstones, gallbladder wall thickening, or biliary dilatation.  Pancreas: Pancreas is well visualized with very mild peripancreatic inflammatory change consistent with acute pancreatitis. No pancreatic necrosis is seen. No mass is noted.  Spleen: Normal in size without focal abnormality.  Adrenals/Urinary Tract: Adrenal glands are within normal limits. Kidneys demonstrate a normal enhancement pattern bilaterally. Small cyst is noted within  the left kidney. No follow-up is recommended. The collecting systems are within normal limits. Bladder is partially distended.  Stomach/Bowel: Scattered diverticular change of the colon is noted without evidence of diverticulitis. The appendix is within normal limits. Small bowel is within normal limits. Postsurgical changes in the stomach are noted consistent with sleeve resection.  Vascular/Lymphatic: Aortic atherosclerosis. No enlarged abdominal or pelvic lymph nodes.  Reproductive: Status post hysterectomy. No adnexal masses.  Other: No abdominal wall hernia or abnormality. No abdominopelvic ascites.  Musculoskeletal: No acute or significant osseous findings.  IMPRESSION: Mild changes of pancreatitis.  No necrosis is noted.  Diverticulosis without diverticulitis.  Electronically Signed   By: Oneil Devonshire M.D.   On: 07/20/2023 22:06    Pertinent GI Studies   November 2023 EGD for evaluation of abdominal pain LA grade a esophagitis, 1 cm hiatal hernia, sleeve gastrectomy found characterized by healthy-appearing mucosa, normal examined duodenum.   May 2022 screening colonoscopy Perianal skin tags,  two sessile polyps ranging 4 to 5 mm in size were removed from the cecum.  2 sessile polyps ranging 4 to 5 mm in size were removed from the hepatic flexure.  A 4 mm polyp which was sessile was removed from the transverse colon.  Multiple large and small mouth diverticula found in the whole colon.   Diagnosis 1. Surgical [P], colon, cecum, polyp (2) - TUBULAR ADENOMA WITHOUT HIGH-GRADE DYSPLASIA OR MALIGNANCY - SMALL SERRATED POLYP, CANNOT DISTINGUISH BETWEEN A SESSILE SERRATED POLYP AND HYPERPLASTIC POLYP 2. Surgical [P], colon, hepatic flexure, transverse, polyp (3) - TUBULAR ADENOMA WITHOUT HIGH-GRADE DYSPLASIA OR MALIGNANCY - HYPERPLASTIC POLYP - BENIGN LEIOMYOMA   Past Medical History:  Diagnosis Date   Allergy    Anemia    Arthritis    not diagnosed - in knees per  pt    COPD (chronic obstructive pulmonary disease) (HCC)    ? per PCP- ref to pulmonary    GERD (gastroesophageal reflux disease)    occ    Hyperlipidemia    Hypertension    Morbid obesity (HCC)    PONV (postoperative nausea and vomiting)     Past Surgical History:  Procedure Laterality Date   COLONOSCOPY     LAPAROSCOPIC GASTRIC SLEEVE RESECTION  12/22/2011   Procedure: LAPAROSCOPIC GASTRIC SLEEVE RESECTION;  Surgeon: Redell Faith, DO;  Location: [facility];  Service: General;  Laterality: N/A;   LAPAROSCOPIC TOTAL HYSTERECTOMY  1990   MULTIPLE TOOTH EXTRACTIONS  1981   TUBAL LIGATION  1990   UPPER GASTROINTESTINAL ENDOSCOPY     UPPER GI ENDOSCOPY  12/22/2011   Procedure: UPPER GI ENDOSCOPY;  Surgeon: Redell Faith, DO;  Location: [facility];  Service: General;;    Family History  Problem Relation Age of Onset   Bladder Cancer Mother    Breast cancer Maternal Aunt    Breast cancer Maternal Aunt    Ovarian cancer Maternal Aunt    Breast cancer Paternal Aunt    Breast cancer Paternal Aunt    Breast cancer Paternal Aunt    Colon cancer Neg Hx    Colon polyps Neg Hx    Esophageal cancer Neg Hx    Rectal cancer Neg Hx    Stomach cancer Neg Hx     Prior to Admission medications   Medication Sig Start Date End Date Taking? Authorizing Provider  albuterol  (PROVENTIL ) (2.5 MG/3ML) 0.083% nebulizer solution Take 3 mLs (1 vial) by nebulization every 6 (six) hours as needed. 10/14/20  Yes   atorvastatin  (LIPITOR) 20 MG tablet Take 1 tablet (20 mg total) by mouth daily. 03/24/23  Yes   Cholecalciferol (VITAMIN D3 PO) Take 1 tablet by mouth daily.   Yes [provider]  famotidine  (PEPCID ) 20 MG tablet Take 1 tablet (20 mg total) by mouth at bedtime. 04/22/23  Yes Kennedy-Smith, Colleen M, NP  fluticasone  (FLONASE ) 50 MCG/ACT nasal spray Place 1 spray into both nostrils daily.   Yes [provider]  lisinopril -hydrochlorothiazide  (ZESTORETIC ) 20-25 MG tablet Take 1 tablet by  mouth daily. 12/07/22  Yes   Multiple Vitamin (MULTIVITAMIN WITH MINERALS) TABS tablet Take 1 tablet by mouth daily.   Yes [provider]  ondansetron  (ZOFRAN -ODT) 8 MG disintegrating tablet Take 1 tablet (8 mg total) by mouth every 8 (eight) hours as needed for nausea or vomiting. 08/24/22  Yes Raford Lenis, MD  albuterol  (VENTOLIN  HFA) 108 (90 Base) MCG/ACT inhaler Inhale 2 puffs into the lungs every 6 (six) hours as needed. Patient not taking:  Reported on 07/20/2023 12/07/22     Budeson-Glycopyrrol-Formoterol  (BREZTRI  AEROSPHERE) 160-9-4.8 MCG/ACT AERO Inhale 2 puffs into the lungs 2 (two) times daily. Patient not taking: Reported on 07/20/2023 12/07/22     buPROPion  (WELLBUTRIN  XL) 150 MG 24 hr tablet Take 1 tablet (150 mg total) by mouth in the morning. Patient not taking: Reported on 07/20/2023 08/19/22     lisinopril -hydrochlorothiazide  (ZESTORETIC ) 20-25 MG tablet Take 1 tablet by mouth daily. Patient not taking: Reported on 07/20/2023 11/06/21     nicotine  (NICODERM CQ  - DOSED IN MG/24 HOURS) 14 mg/24hr patch Place 1 patch (14 mg total) onto the skin daily. Patient not taking: Reported on 07/20/2023 04/21/22     hydrochlorothiazide  (HYDRODIURIL ) 25 MG tablet Take 1 tablet (25 mg total) by mouth daily. 10/14/20 08/01/21    lisinopril  (ZESTRIL ) 10 MG tablet Take 1 tablet (10 mg total) by mouth daily. 10/14/20 08/01/21      Current Facility-Administered Medications  Medication Dose Route Frequency Provider Last Rate Last Admin   0.9 %  sodium chloride  infusion  250 mL Intravenous PRN Sundil, Subrina, MD       acetaminophen  (TYLENOL ) tablet 650 mg  650 mg Oral Q6H PRN Sundil, Subrina, MD       Or   acetaminophen  (TYLENOL ) suppository 650 mg  650 mg Rectal Q6H PRN Sundil, Subrina, MD       albuterol  (PROVENTIL ) (2.5 MG/3ML) 0.083% nebulizer solution 2.5 mg  2.5 mg Nebulization Q6H PRN Sundil, Subrina, MD       atorvastatin  (LIPITOR) tablet 20 mg  20 mg Oral Daily Sundil, Subrina, MD   20 mg  at 07/21/23 0839   enoxaparin  (LOVENOX ) injection 55 mg  55 mg Subcutaneous Q24H Sundil, Subrina, MD       famotidine  (PEPCID ) tablet 20 mg  20 mg Oral QHS Sundil, Subrina, MD       ferrous sulfate tablet 325 mg  325 mg Oral Q breakfast Sundil, Subrina, MD   325 mg at 07/21/23 9161   fluticasone  (FLONASE ) 50 MCG/ACT nasal spray 1 spray  1 spray Each Nare Daily Sundil, Subrina, MD   1 spray at 07/21/23 0840   lisinopril  (ZESTRIL ) tablet 20 mg  20 mg Oral Daily Laron Agent, RPH   20 mg at 07/21/23 9161   And   hydrochlorothiazide  (HYDRODIURIL ) tablet 25 mg  25 mg Oral Daily Laron Agent, RPH   25 mg at 07/21/23 0838   HYDROmorphone  (DILAUDID ) injection 0.5-1 mg  0.5-1 mg Intravenous Q2H PRN Sundil, Subrina, MD       lactated ringers  infusion   Intravenous Continuous Sundil, Subrina, MD 150 mL/hr at 07/21/23 0324 Infusion Verify at 07/21/23 0324   ondansetron  (ZOFRAN ) tablet 4 mg  4 mg Oral Q6H PRN Sundil, Subrina, MD   4 mg at 07/21/23 9760   Or   ondansetron  (ZOFRAN ) injection 4 mg  4 mg Intravenous Q6H PRN Sundil, Subrina, MD       polyethylene glycol (MIRALAX / GLYCOLAX) packet 17 g  17 g Oral Daily PRN Sundil, Subrina, MD       sodium chloride  flush (NS) 0.9 % injection 3 mL  3 mL Intravenous Q12H Sundil, Subrina, MD   3 mL at 07/21/23 9160   sodium chloride  flush (NS) 0.9 % injection 3 mL  3 mL Intravenous PRN Sundil, Subrina, MD        Allergies as of 07/20/2023 - Review Complete 07/20/2023  Allergen Reaction Noted   Amoxicillin -pot clavulanate  07/20/2023   Tramadol   hcl  05/06/2020   Varenicline  Other (See Comments) 07/20/2023    Social History   Socioeconomic History   Marital status: Widowed    Spouse name: Not on file   Number of children: Not on file   Years of education: Not on file   Highest education level: Not on file  Occupational History   Not on file  Tobacco Use   Smoking status: Every Day    Current packs/day: 0.50    Types: Cigarettes   Smokeless tobacco:  Former    Quit date: 08/07/2011   Tobacco comments:    < 0.5 / pack a day per pt   Vaping Use   Vaping status: Never Used  Substance and Sexual Activity   Alcohol use: Yes    Comment: occ    Drug use: No   Sexual activity: Not on file  Other Topics Concern   Not on file  Social History Narrative   Not on file   Social Drivers of Health   Financial Resource Strain: Not on file  Food Insecurity: No Food Insecurity (07/21/2023)   Hunger Vital Sign    Worried About Running Out of Food in the Last Year: Never true    Ran Out of Food in the Last Year: Never true  Transportation Needs: No Transportation Needs (07/21/2023)   PRAPARE - Administrator, Civil Service (Medical): No    Lack of Transportation (Non-Medical): No  Physical Activity: Not on file  Stress: Not on file  Social Connections: Patient Declined (07/21/2023)   Social Connection and Isolation Panel    Frequency of Communication with Friends and Family: Patient declined    Frequency of Social Gatherings with Friends and Family: Patient declined    Attends Religious Services: Patient declined    Database administrator or Organizations: Patient declined    Attends Banker Meetings: Patient declined    Marital Status: Patient declined  Intimate Partner Violence: Not At Risk (07/21/2023)   Humiliation, Afraid, Rape, and Kick questionnaire    Fear of Current or Ex-Partner: No    Emotionally Abused: No    Physically Abused: No    Sexually Abused: No     Code Status   Code Status: Full Code  Review of Systems: All systems reviewed and negative except where noted in HPI.  Physical Exam: Vital signs in last 24 hours: Temp:  [97.8 F (36.6 C)-98.6 F (37 C)] 97.8 F (36.6 C) (06/25 0812) Pulse Rate:  [49-65] 49 (06/25 0812) Resp:  [15-18] 17 (06/25 0812) BP: (112-153)/(45-73) 140/60 (06/25 0838) SpO2:  [99 %-100 %] 100 % (06/25 0812) Weight:  [113.4 kg] 113.4 kg (06/24 1447)    General:   Pleasant female in NAD Psych:  Cooperative. Normal mood and affect Eyes: Pupils equal Ears:  Normal auditory acuity Nose: No deformity, discharge or lesions Neck:  Supple, no masses felt Lungs:  Clear to auscultation.  Heart:  Regular rate, regular rhythm.  Abdomen:  Soft, nondistended, mild RUQ tenderness, active bowel sounds, no masses felt Rectal :  Deferred Msk: Symmetrical without gross deformities.  Neurologic:  Alert, oriented, grossly normal neurologically Skin:  Intact without significant lesions.    Intake/Output from previous day: 06/24 0701 - 06/25 0700 In: 428.1 [I.V.:428.1] Out: -  Intake/Output this shift:  No intake/output data recorded.   Vina Dasen, NP-C   07/21/2023, 11:13 AM

## 2023-07-21 NOTE — Progress Notes (Signed)
 PROGRESS NOTE    Marissa Cox  FMW:[id number] DOB: [birth year] DOA: 07/20/2023 PCP: Elliot Charm, MD   Brief Narrative: This 63 yrs old female with medical history significant for lumbar radiculopathy, bilateral osteoarthritis, anxiety, depression, adjustment disorder, COPD, hyperlipidemia, essential hypertension, iron deficiency anemia, history of recurrent pancreatitis, and chronic tobacco use presented to the ED complaining of abdominal pain for three days.  She describes pain in the right upper quadrant radiating towards the back associated with nausea but without vomiting.  Pertinent labs in the ED include lipase 153.  CT A&P shows mild changes of pancreatitis without any necrosis.  Patient was admitted for further evaluation.  GI was consulted.  Assessment & Plan:   Principal Problem:   Acute pancreatitis Active Problems:   COPD (chronic obstructive pulmonary disease) (HCC)   Hyperlipidemia   Essential hypertension   Iron deficiency anemia   GAD (generalized anxiety disorder)   Continuous dependence on cigarette smoking   Acute pancreatitis: History of recurrent pancreatitis-third episode. She presented in the ED with abdominal pain, Nausea and elevated Lipase. She describes pain radiating towards the right side of her back as bandlike sensation. She has 2 episodes of previous pancreatitis in the past.  Has been seen by [facility] GI outpatient. Elevated lipase 153.  CBC > No evidence of leukocytosis.  stable H&H and low MCV. CT A/P showed evidence for acute pancreatitis without any necrosis.  No evidence of cholelithiasis/cholecystitis/biliary ductal dilation. Patient reports social drinking,  has never been a heavy drinker in her life. Unclear etiology of acute pancreatitis at this time given there is no evidence of gallstone. Triglyceride levels normal. Continue clear liquid diet, Pain management and aggressive hydration with LR 150 cc/h. Consulted [place]  [facility] Dr. Legrand.   History of COPD: Stable.  Continue albuterol  as needed   Hyperlipidemia: Continue Lipitor   Essential hypertension: Continue lisinopril , hydrochlorothiazide .   Chronic iron deficiency anemia : Continue oral iron supplement.   Generalized anxiety disorder: Not taking any antianxiety medication anymore.   History of smoking cigarette: Reported quit smoking few years ago.  DVT prophylaxis: Lovenox  Code Status: Full code Family Communication: No family at bedside Disposition Plan:    Status is: Observation The patient remains OBS appropriate and will d/c before 2 midnights.   Admitted for recurrent pancreatitis requiring conservative management.  Consultants:  Gastroenterology  Procedures:CT A/P  Antimicrobials:  Anti-infectives (From admission, onward)    None      Subjective: Patient was seen and examined at bedside.Overnight events noted. Patient denies any nausea,  reports abdominal pain is still present, radiating towards the back. She is tolerating clear liquid diet.  Objective: Vitals:   07/20/23 2359 07/21/23 0355 07/21/23 0812 07/21/23 0838  BP: (!) 142/60 (!) 112/55 (!) 140/60 (!) 140/60  Pulse: (!) 55 (!) 49 (!) 49   Resp: 18 18 17    Temp: 98 F (36.7 C) 97.9 F (36.6 C) 97.8 F (36.6 C)   TempSrc:      SpO2: 100% 100% 100%   Weight:      Height:        Intake/Output Summary (Last 24 hours) at 07/21/2023 1146 Last data filed at 07/21/2023 0324 Gross per 24 hour  Intake 428.07 ml  Output --  Net 428.07 ml   Filed Weights   07/20/23 1447  Weight: 113.4 kg    Examination:  General exam: Appears calm and comfortable  Respiratory system: Clear to auscultation. Respiratory effort normal. Cardiovascular system: S1 & S2 heard,  RRR. No JVD, murmurs, rubs, gallops or clicks. No pedal edema. Gastrointestinal system: Abdomen is non distended, soft and  Mildly tender in RUQ. SABRA Normal bowel sounds heard. Central nervous system:  Alert and oriented. No focal neurological deficits. Extremities: Symmetric 5 x 5 power. Skin: No rashes, lesions or ulcers Psychiatry: Judgement and insight appear normal. Mood & affect appropriate.     Data Reviewed: I have personally reviewed following labs and imaging studies  CBC: Recent Labs  Lab 07/20/23 1449 07/21/23 0107  WBC 6.4 6.1  HGB 11.3* 10.6*  HCT 37.5 35.0*  MCV 77.0* 76.8*  PLT 249 212   Basic Metabolic Panel: Recent Labs  Lab 07/20/23 1449 07/21/23 0107  NA 137 139  K 3.6 3.6  CL 103 103  CO2 24 23  GLUCOSE 115* 99  BUN 12 11  CREATININE 1.07* 1.12*  CALCIUM  9.3 9.0   GFR: Estimated Creatinine Clearance: 66.8 mL/min (A) (by C-G formula based on SCr of 1.12 mg/dL (H)). Liver Function Tests: Recent Labs  Lab 07/20/23 1449 07/21/23 0107  AST 22 19  ALT 16 16  ALKPHOS 82 75  BILITOT 0.6 0.6  PROT 7.0 6.5  ALBUMIN 3.4* 3.2*   Recent Labs  Lab 07/20/23 1449  LIPASE 153*   No results for input(s): AMMONIA in the last 168 hours. Coagulation Profile: No results for input(s): INR, PROTIME in the last 168 hours. Cardiac Enzymes: No results for input(s): CKTOTAL, CKMB, CKMBINDEX, TROPONINI in the last 168 hours. BNP (last 3 results) No results for input(s): PROBNP in the last 8760 hours. HbA1C: No results for input(s): HGBA1C in the last 72 hours. CBG: No results for input(s): GLUCAP in the last 168 hours. Lipid Profile: Recent Labs    07/21/23 0107  TRIG 63   Thyroid  Function Tests: No results for input(s): TSH, T4TOTAL, FREET4, T3FREE, THYROIDAB in the last 72 hours. Anemia Panel: No results for input(s): VITAMINB12, FOLATE, FERRITIN, TIBC, IRON, RETICCTPCT in the last 72 hours. Sepsis Labs: No results for input(s): PROCALCITON, LATICACIDVEN in the last 168 hours.  No results found for this or any previous visit (from the past 240 hours).   Radiology Studies: CT ABDOMEN PELVIS W  CONTRAST Result Date: 07/20/2023 CLINICAL DATA:  Abdominal pain and nausea for several days, initial encounter EXAM: CT ABDOMEN AND PELVIS WITH CONTRAST TECHNIQUE: Multidetector CT imaging of the abdomen and pelvis was performed using the standard protocol following bolus administration of intravenous contrast. RADIATION DOSE REDUCTION: This exam was performed according to the departmental dose-optimization program which includes automated exposure control, adjustment of the mA and/or kV according to patient size and/or use of iterative reconstruction technique. CONTRAST:  75mL OMNIPAQUE  IOHEXOL  350 MG/ML SOLN COMPARISON:  08/24/2022 FINDINGS: Lower chest: No acute abnormality. Hepatobiliary: No focal liver abnormality is seen. No gallstones, gallbladder wall thickening, or biliary dilatation. Pancreas: Pancreas is well visualized with very mild peripancreatic inflammatory change consistent with acute pancreatitis. No pancreatic necrosis is seen. No mass is noted. Spleen: Normal in size without focal abnormality. Adrenals/Urinary Tract: Adrenal glands are within normal limits. Kidneys demonstrate a normal enhancement pattern bilaterally. Small cyst is noted within the left kidney. No follow-up is recommended. The collecting systems are within normal limits. Bladder is partially distended. Stomach/Bowel: Scattered diverticular change of the colon is noted without evidence of diverticulitis. The appendix is within normal limits. Small bowel is within normal limits. Postsurgical changes in the stomach are noted consistent with sleeve resection. Vascular/Lymphatic: Aortic atherosclerosis. No enlarged abdominal or  pelvic lymph nodes. Reproductive: Status post hysterectomy. No adnexal masses. Other: No abdominal wall hernia or abnormality. No abdominopelvic ascites. Musculoskeletal: No acute or significant osseous findings. IMPRESSION: Mild changes of pancreatitis.  No necrosis is noted. Diverticulosis without  diverticulitis. Electronically Signed   By: Oneil Devonshire M.D.   On: 07/20/2023 22:06   Scheduled Meds:  atorvastatin   20 mg Oral Daily   enoxaparin  (LOVENOX ) injection  55 mg Subcutaneous Q24H   famotidine   20 mg Oral QHS   ferrous sulfate  325 mg Oral Q breakfast   fluticasone   1 spray Each Nare Daily   lisinopril   20 mg Oral Daily   And   hydrochlorothiazide   25 mg Oral Daily   sodium chloride  flush  3 mL Intravenous Q12H   Continuous Infusions:  sodium chloride      lactated ringers  150 mL/hr at 07/21/23 0324     LOS: 0 days    Time spent: 50 Mins    Darcel Dawley, MD [facility]   If 7PM-7AM, please contact night-coverage

## 2023-07-22 ENCOUNTER — Telehealth: Payer: Self-pay

## 2023-07-22 DIAGNOSIS — K838 Other specified diseases of biliary tract: Secondary | ICD-10-CM

## 2023-07-22 DIAGNOSIS — K859 Acute pancreatitis without necrosis or infection, unspecified: Secondary | ICD-10-CM | POA: Diagnosis not present

## 2023-07-22 LAB — CBC
HCT: 32.1 % — ABNORMAL LOW (ref 36.0–46.0)
Hemoglobin: 9.9 g/dL — ABNORMAL LOW (ref 12.0–15.0)
MCH: 23.2 pg — ABNORMAL LOW (ref 26.0–34.0)
MCHC: 30.8 g/dL (ref 30.0–36.0)
MCV: 75.4 fL — ABNORMAL LOW (ref 80.0–100.0)
Platelets: 208 10*3/uL (ref 150–400)
RBC: 4.26 MIL/uL (ref 3.87–5.11)
RDW: 13.7 % (ref 11.5–15.5)
WBC: 4.7 10*3/uL (ref 4.0–10.5)
nRBC: 0 % (ref 0.0–0.2)

## 2023-07-22 LAB — IRON AND TIBC
Iron: 30 ug/dL (ref 28–170)
Saturation Ratios: 10 % — ABNORMAL LOW (ref 10.4–31.8)
TIBC: 293 ug/dL (ref 250–450)
UIBC: 263 ug/dL

## 2023-07-22 LAB — LIPASE, BLOOD: Lipase: 47 U/L (ref 11–51)

## 2023-07-22 LAB — IGG 4: IgG, Subclass 4: 15 mg/dL (ref 2–96)

## 2023-07-22 LAB — FERRITIN: Ferritin: 161 ng/mL (ref 11–307)

## 2023-07-22 NOTE — Progress Notes (Addendum)
 Daily Progress Note  DOA: 07/20/2023 Hospital Day: 3   Cc: Recurrent acute pancreatitis   ASSESSMENT    63 year old female with recurrent ( 3rd episode) of acute uncomplicated pancreatitis.  Unrevealing workup thus far but now MRCP showing possible choledocholithiasis (Equivocal filling defect within the distal CBD)    Chronic anemia with microcytosis Hemoglobin 10.6 today, down from baseline of 12.1 though probably dilutional after IVF. She is no longer taking iron due to constipation   History of colon polyps  Sessile serrated and TAs on last colonoscopy in 2022.   Principal Problem:   Acute pancreatitis Active Problems:   COPD (chronic obstructive pulmonary disease) (HCC)   Hyperlipidemia   Essential hypertension   Iron deficiency anemia   GAD (generalized anxiety disorder)   Continuous dependence on cigarette smoking   PLAN   --Recommending upper endoscopic ultrasound for further evaluation of bile duct filling defect.  May need ERCP after that depending on results.  Unable to coordinate procedure to be done today.  Patient tells me she has a job and cannot stay at the hospital any longer.  She wants to do this as an outpatient  which may not be unreasonable since she has no biliary obstruction. Will discuss with biliary endoscopist . If this is the plan then we should make sure she can tolerate solids prior to discharge  ADDENDUM:  EUS can not be done today. She needs to go home so will give low fat diet. Our office will contact her for outpatient follow up   Subjective   Feels okay, no significant abdominal pain. Wants to go home, has to get back to work   Objective    Recent Labs    07/20/23 1449 07/21/23 0107 07/22/23 0328  WBC 6.4 6.1 4.7  HGB 11.3* 10.6* 9.9*  HCT 37.5 35.0* 32.1*  MCV 77.0* 76.8* 75.4*  PLT 249 212 208   Recent Labs    07/22/23 0328  FERRITIN 161  TIBC 293  IRONPCTSAT 10*   Recent Labs    07/20/23 1449  07/21/23 0107  NA 137 139  K 3.6 3.6  CL 103 103  CO2 24 23  GLUCOSE 115* 99  BUN 12 11  CREATININE 1.07* 1.12*  CALCIUM  9.3 9.0   Recent Labs    07/20/23 1449 07/21/23 0107  PROT 7.0 6.5  ALBUMIN 3.4* 3.2*  AST 22 19  ALT 16 16  ALKPHOS 82 75  BILITOT 0.6 0.6      Imaging:  MR 3D Recon At Scanner CLINICAL DATA:  Pancreatitis.  EXAM: MRI ABDOMEN WITHOUT AND WITH CONTRAST (INCLUDING MRCP)  TECHNIQUE: Multiplanar multisequence MR imaging of the abdomen was performed both before and after the administration of intravenous contrast. Heavily T2-weighted images of the biliary and pancreatic ducts were obtained, and three-dimensional MRCP images were rendered by post processing.  CONTRAST:  10mL GADAVIST  GADOBUTROL  1 MMOL/ML IV SOLN  COMPARISON:  CT 07/20/2023  FINDINGS: Lower chest: No acute findings.  Hepatobiliary: Motion artifact diminishes exam detail. Within this limitation, no focal enhancing liver lesion identified.  Pancreas:  Normal appearance of the gallbladder. No gallstones, gallbladder wall thickening or pericholecystic inflammation. No bile duct dilatation. The common bile duct has a normal caliber measuring 3 mm. MRCP images are diminished due to motion artifact. On the coronal T2 weighted sequence there is an equivocal filling defect within the distal CBD measuring approximately 2 mm. This does not have the typical appearance of a gallstone and may  be artifactual this is not confirmed on any of the other sequences. This  Spleen: No main duct dilatation. Scratch diffuse pancreatic edema, most severe about the tail of pancreas. There is peripancreatic fluid and fat infiltration. No mass identified. No signs of pseudocyst.  Adrenals/Urinary Tract: Unchanged left adrenal nodule measuring 2 cm. This is compatible with a benign adenoma, image 18/3. No follow-up imaging recommended. Bosniak class 1 left kidney cyst measures 1.1 cm, image 23/3. No  follow-up imaging recommended. No suspicious kidney mass or obstructive uropathy.  Stomach/Bowel: Visualized portions within the abdomen are unremarkable.  Vascular/Lymphatic: Aortic atherosclerosis, no aneurysm. No adenopathy.  Other:  Exam detail is diminished due to motion artifact.  Musculoskeletal: No suspicious bone lesions identified.  IMPRESSION: 1. Exam detail is diminished due to motion artifact. 2. Acute pancreatitis. No signs of pseudocyst or necrosis. 3. No bile duct dilatation. No signs of cholelithiasis. Equivocal filling defect within the distal CBD visualized on the coronal T2 weighted sequences only. Favor artifact over choledocholithiasis. 4. Benign left adrenal adenoma and Bosniak class 1 left kidney cyst. No follow-up imaging recommended.  Electronically Signed   By: Waddell Calk M.D.   On: 07/22/2023 04:52 MR ABDOMEN MRCP W WO CONTAST CLINICAL DATA:  Pancreatitis.  EXAM: MRI ABDOMEN WITHOUT AND WITH CONTRAST (INCLUDING MRCP)  TECHNIQUE: Multiplanar multisequence MR imaging of the abdomen was performed both before and after the administration of intravenous contrast. Heavily T2-weighted images of the biliary and pancreatic ducts were obtained, and three-dimensional MRCP images were rendered by post processing.  CONTRAST:  10mL GADAVIST  GADOBUTROL  1 MMOL/ML IV SOLN  COMPARISON:  CT 07/20/2023  FINDINGS: Lower chest: No acute findings.  Hepatobiliary: Motion artifact diminishes exam detail. Within this limitation, no focal enhancing liver lesion identified.  Pancreas:  Normal appearance of the gallbladder. No gallstones, gallbladder wall thickening or pericholecystic inflammation. No bile duct dilatation. The common bile duct has a normal caliber measuring 3 mm. MRCP images are diminished due to motion artifact. On the coronal T2 weighted sequence there is an equivocal filling defect within the distal CBD measuring approximately 2 mm. This  does not have the typical appearance of a gallstone and may be artifactual this is not confirmed on any of the other sequences. This  Spleen: No main duct dilatation. Scratch diffuse pancreatic edema, most severe about the tail of pancreas. There is peripancreatic fluid and fat infiltration. No mass identified. No signs of pseudocyst.  Adrenals/Urinary Tract: Unchanged left adrenal nodule measuring 2 cm. This is compatible with a benign adenoma, image 18/3. No follow-up imaging recommended. Bosniak class 1 left kidney cyst measures 1.1 cm, image 23/3. No follow-up imaging recommended. No suspicious kidney mass or obstructive uropathy.  Stomach/Bowel: Visualized portions within the abdomen are unremarkable.  Vascular/Lymphatic: Aortic atherosclerosis, no aneurysm. No adenopathy.  Other:  Exam detail is diminished due to motion artifact.  Musculoskeletal: No suspicious bone lesions identified.  IMPRESSION: 1. Exam detail is diminished due to motion artifact. 2. Acute pancreatitis. No signs of pseudocyst or necrosis. 3. No bile duct dilatation. No signs of cholelithiasis. Equivocal filling defect within the distal CBD visualized on the coronal T2 weighted sequences only. Favor artifact over choledocholithiasis. 4. Benign left adrenal adenoma and Bosniak class 1 left kidney cyst. No follow-up imaging recommended.  Electronically Signed   By: Waddell Calk M.D.   On: 07/22/2023 04:52     Scheduled inpatient medications:   atorvastatin   20 mg Oral Daily   enoxaparin  (LOVENOX ) injection  55 mg  Subcutaneous Q24H   famotidine   20 mg Oral QHS   ferrous sulfate  325 mg Oral Q breakfast   fluticasone   1 spray Each Nare Daily   hydrochlorothiazide   25 mg Oral Daily   sodium chloride  flush  3 mL Intravenous Q12H   Continuous inpatient infusions:  PRN inpatient medications: acetaminophen  **OR** acetaminophen , albuterol , HYDROmorphone  (DILAUDID ) injection, ondansetron  **OR**  ondansetron  (ZOFRAN ) IV, polyethylene glycol, sodium chloride  flush  Vital signs in last 24 hours: Temp:  [98 F (36.7 C)-98.3 F (36.8 C)] 98.1 F (36.7 C) (06/26 0533) Pulse Rate:  [48-56] 56 (06/26 0533) Resp:  [17-18] 18 (06/26 0533) BP: (112-126)/(41-58) 112/58 (06/26 0533) SpO2:  [99 %-100 %] 99 % (06/26 0533)    Intake/Output Summary (Last 24 hours) at 07/22/2023 1048 Last data filed at 07/21/2023 1516 Gross per 24 hour  Intake 1086.39 ml  Output --  Net 1086.39 ml    Intake/Output from previous day: 06/25 0701 - 06/26 0700 In: 1086.4 [I.V.:1086.4] Out: -  Intake/Output this shift: No intake/output data recorded.   Physical Exam:  General: Alert female in NAD Heart:  Regular rate and rhythm.  Pulmonary: Normal respiratory effort Abdomen: Soft, nondistended, nontender. Normal bowel sounds. Extremities: No lower extremity edema  Neurologic: Alert and oriented Psych: Pleasant. Cooperative     LOS: 1 day   Vina Dasen ,NP 07/22/2023, 10:48 AM

## 2023-07-22 NOTE — Progress Notes (Signed)
 Will make npo after 0200 except meds for potential lap cholecystectomy on 6/27.

## 2023-07-22 NOTE — Progress Notes (Signed)
 PROGRESS NOTE    Marissa Cox  FMW:[id number] DOB: [birth year] DOA: 07/20/2023 PCP: Elliot Charm, MD   Brief Narrative: This 63 yrs old female with medical history significant for lumbar radiculopathy, bilateral osteoarthritis, anxiety, depression, adjustment disorder, COPD, hyperlipidemia, essential hypertension, iron deficiency anemia, history of recurrent pancreatitis, and chronic tobacco use presented to the ED complaining of abdominal pain for three days.  She describes pain in the right upper quadrant radiating towards the back associated with nausea but without vomiting.  Pertinent labs in the ED include lipase 153.  CT A&P shows mild changes of pancreatitis without any necrosis.  Patient was admitted for further evaluation.  GI was consulted.  Assessment & Plan:   Principal Problem:   Acute pancreatitis Active Problems:   COPD (chronic obstructive pulmonary disease) (HCC)   Hyperlipidemia   Essential hypertension   Iron deficiency anemia   GAD (generalized anxiety disorder)   Continuous dependence on cigarette smoking   Acute pancreatitis: History of recurrent pancreatitis-third episode. She presented in the ED with abdominal pain, Nausea and elevated Lipase. She describes pain radiating towards the right side of her back as bandlike sensation. She has 2 episodes of previous pancreatitis in the past.  Has been seen by [facility] GI outpatient. Elevated lipase 153.  CBC > No evidence of leukocytosis.  stable H&H and low MCV. CT A/P showed evidence for acute pancreatitis without any necrosis.  No evidence of cholelithiasis/cholecystitis/biliary ductal dilation. Patient reports social drinking,  has never been a heavy drinker in her life. Unclear etiology of acute pancreatitis at this time given there is no evidence of gallstone. Triglyceride levels normal. Continue clear liquid diet, Pain management and aggressive hydration with LR 150 cc/h. GI recommended ERCP  but that can be done outpatient. General surgery consulted to see if patient can have cholecystectomy as this is her third episodes of pancreatitis.   History of COPD: Stable.  Continue albuterol  as needed.   Hyperlipidemia: Continue Lipitor.   Essential hypertension: Continue lisinopril , hydrochlorothiazide .   Chronic iron deficiency anemia : Continue oral iron supplement.   Generalized anxiety disorder: Not taking any antianxiety medication anymore.   History of smoking cigarette: Reported quit smoking few years ago.  DVT prophylaxis: Lovenox  Code Status: Full code Family Communication: No family at bedside Disposition Plan:   Status is: Inpatient Remains inpatient appropriate because:     Admitted for recurrent pancreatitis requiring conservative management.  Surgery and gastroenterology consulted.  Consultants:  Gastroenterology  Procedures:CT A/P  Antimicrobials:  Anti-infectives (From admission, onward)    None      Subjective: Patient was seen and examined at bedside. Overnight events noted. Patient denies any nausea, reports abdominal pain is still present, radiating towards the back. She is tolerating clear liquid diet.  Patient wants to have surgery if required.  Objective: Vitals:   07/21/23 1221 07/21/23 1613 07/21/23 2110 07/22/23 0533  BP: (!) 126/43 (!) 121/41 (!) 118/45 (!) 112/58  Pulse: (!) 48 (!) 50 (!) 51 (!) 56  Resp: 18 18 17 18   Temp: 98 F (36.7 C) 98.1 F (36.7 C) 98.3 F (36.8 C) 98.1 F (36.7 C)  TempSrc:   Oral Oral  SpO2: 100% 99% 100% 99%  Weight:      Height:        Intake/Output Summary (Last 24 hours) at 07/22/2023 1333 Last data filed at 07/21/2023 1516 Gross per 24 hour  Intake 1086.39 ml  Output --  Net 1086.39 ml   [employer]  07/20/23 1447  Weight: 113.4 kg    Examination:  General exam: Appears calm and comfortable, not in any acute distress. Respiratory system: Clear to auscultation. Respiratory  effort normal.  RR 16 Cardiovascular system: S1 & S2 heard, RRR. No JVD, murmurs, rubs, gallops or clicks.  Gastrointestinal system: Abdomen is non distended, soft and  Mildly tender in RUQ. SABRA Normal bowel sounds heard. Central nervous system: Alert and oriented. No focal neurological deficits. Extremities: No edema, no cyanosis, no clubbing.. Skin: No rashes, lesions or ulcers Psychiatry: Judgement and insight appear normal. Mood & affect appropriate.     Data Reviewed: I have personally reviewed following labs and imaging studies  CBC: Recent Labs  Lab 07/20/23 1449 07/21/23 0107 07/22/23 0328  WBC 6.4 6.1 4.7  HGB 11.3* 10.6* 9.9*  HCT 37.5 35.0* 32.1*  MCV 77.0* 76.8* 75.4*  PLT 249 212 208   Basic Metabolic Panel: Recent Labs  Lab 07/20/23 1449 07/21/23 0107  NA 137 139  K 3.6 3.6  CL 103 103  CO2 24 23  GLUCOSE 115* 99  BUN 12 11  CREATININE 1.07* 1.12*  CALCIUM  9.3 9.0   GFR: Estimated Creatinine Clearance: 66.8 mL/min (A) (by C-G formula based on SCr of 1.12 mg/dL (H)). Liver Function Tests: Recent Labs  Lab 07/20/23 1449 07/21/23 0107  AST 22 19  ALT 16 16  ALKPHOS 82 75  BILITOT 0.6 0.6  PROT 7.0 6.5  ALBUMIN 3.4* 3.2*   Recent Labs  Lab 07/20/23 1449 07/22/23 0328  LIPASE 153* 47   No results for input(s): AMMONIA in the last 168 hours. Coagulation Profile: No results for input(s): INR, PROTIME in the last 168 hours. Cardiac Enzymes: No results for input(s): CKTOTAL, CKMB, CKMBINDEX, TROPONINI in the last 168 hours. BNP (last 3 results) No results for input(s): PROBNP in the last 8760 hours. HbA1C: No results for input(s): HGBA1C in the last 72 hours. CBG: No results for input(s): GLUCAP in the last 168 hours. Lipid Profile: Recent Labs    07/21/23 0107  TRIG 63   Thyroid  Function Tests: No results for input(s): TSH, T4TOTAL, FREET4, T3FREE, THYROIDAB in the last 72 hours. Anemia Panel: Recent Labs     07/22/23 0328  FERRITIN 161  TIBC 293  IRON 30   Sepsis Labs: No results for input(s): PROCALCITON, LATICACIDVEN in the last 168 hours.  No results found for this or any previous visit (from the past 240 hours).   Radiology Studies: MR ABDOMEN MRCP W WO CONTAST Result Date: 07/22/2023 CLINICAL DATA:  Pancreatitis. EXAM: MRI ABDOMEN WITHOUT AND WITH CONTRAST (INCLUDING MRCP) TECHNIQUE: Multiplanar multisequence MR imaging of the abdomen was performed both before and after the administration of intravenous contrast. Heavily T2-weighted images of the biliary and pancreatic ducts were obtained, and three-dimensional MRCP images were rendered by post processing. CONTRAST:  10mL GADAVIST  GADOBUTROL  1 MMOL/ML IV SOLN COMPARISON:  CT 07/20/2023 FINDINGS: Lower chest: No acute findings. Hepatobiliary: Motion artifact diminishes exam detail. Within this limitation, no focal enhancing liver lesion identified. Pancreas: Normal appearance of the gallbladder. No gallstones, gallbladder wall thickening or pericholecystic inflammation. No bile duct dilatation. The common bile duct has a normal caliber measuring 3 mm. MRCP images are diminished due to motion artifact. On the coronal T2 weighted sequence there is an equivocal filling defect within the distal CBD measuring approximately 2 mm. This does not have the typical appearance of a gallstone and may be artifactual this is not confirmed on any of the other  sequences. This Spleen: No main duct dilatation. Scratch diffuse pancreatic edema, most severe about the tail of pancreas. There is peripancreatic fluid and fat infiltration. No mass identified. No signs of pseudocyst. Adrenals/Urinary Tract: Unchanged left adrenal nodule measuring 2 cm. This is compatible with a benign adenoma, image 18/3. No follow-up imaging recommended. Bosniak class 1 left kidney cyst measures 1.1 cm, image 23/3. No follow-up imaging recommended. No suspicious kidney mass or  obstructive uropathy. Stomach/Bowel: Visualized portions within the abdomen are unremarkable. Vascular/Lymphatic: Aortic atherosclerosis, no aneurysm. No adenopathy. Other:  Exam detail is diminished due to motion artifact. Musculoskeletal: No suspicious bone lesions identified. IMPRESSION: 1. Exam detail is diminished due to motion artifact. 2. Acute pancreatitis. No signs of pseudocyst or necrosis. 3. No bile duct dilatation. No signs of cholelithiasis. Equivocal filling defect within the distal CBD visualized on the coronal T2 weighted sequences only. Favor artifact over choledocholithiasis. 4. Benign left adrenal adenoma and Bosniak class 1 left kidney cyst. No follow-up imaging recommended. Electronically Signed   By: Waddell Calk M.D.   On: 07/22/2023 04:52   MR 3D Recon At Scanner Result Date: 07/22/2023 CLINICAL DATA:  Pancreatitis. EXAM: MRI ABDOMEN WITHOUT AND WITH CONTRAST (INCLUDING MRCP) TECHNIQUE: Multiplanar multisequence MR imaging of the abdomen was performed both before and after the administration of intravenous contrast. Heavily T2-weighted images of the biliary and pancreatic ducts were obtained, and three-dimensional MRCP images were rendered by post processing. CONTRAST:  10mL GADAVIST  GADOBUTROL  1 MMOL/ML IV SOLN COMPARISON:  CT 07/20/2023 FINDINGS: Lower chest: No acute findings. Hepatobiliary: Motion artifact diminishes exam detail. Within this limitation, no focal enhancing liver lesion identified. Pancreas: Normal appearance of the gallbladder. No gallstones, gallbladder wall thickening or pericholecystic inflammation. No bile duct dilatation. The common bile duct has a normal caliber measuring 3 mm. MRCP images are diminished due to motion artifact. On the coronal T2 weighted sequence there is an equivocal filling defect within the distal CBD measuring approximately 2 mm. This does not have the typical appearance of a gallstone and may be artifactual this is not confirmed on any of  the other sequences. This Spleen: No main duct dilatation. Scratch diffuse pancreatic edema, most severe about the tail of pancreas. There is peripancreatic fluid and fat infiltration. No mass identified. No signs of pseudocyst. Adrenals/Urinary Tract: Unchanged left adrenal nodule measuring 2 cm. This is compatible with a benign adenoma, image 18/3. No follow-up imaging recommended. Bosniak class 1 left kidney cyst measures 1.1 cm, image 23/3. No follow-up imaging recommended. No suspicious kidney mass or obstructive uropathy. Stomach/Bowel: Visualized portions within the abdomen are unremarkable. Vascular/Lymphatic: Aortic atherosclerosis, no aneurysm. No adenopathy. Other:  Exam detail is diminished due to motion artifact. Musculoskeletal: No suspicious bone lesions identified. IMPRESSION: 1. Exam detail is diminished due to motion artifact. 2. Acute pancreatitis. No signs of pseudocyst or necrosis. 3. No bile duct dilatation. No signs of cholelithiasis. Equivocal filling defect within the distal CBD visualized on the coronal T2 weighted sequences only. Favor artifact over choledocholithiasis. 4. Benign left adrenal adenoma and Bosniak class 1 left kidney cyst. No follow-up imaging recommended. Electronically Signed   By: Waddell Calk M.D.   On: 07/22/2023 04:52   CT ABDOMEN PELVIS W CONTRAST Result Date: 07/20/2023 CLINICAL DATA:  Abdominal pain and nausea for several days, initial encounter EXAM: CT ABDOMEN AND PELVIS WITH CONTRAST TECHNIQUE: Multidetector CT imaging of the abdomen and pelvis was performed using the standard protocol following bolus administration of intravenous contrast. RADIATION DOSE REDUCTION: This  exam was performed according to the departmental dose-optimization program which includes automated exposure control, adjustment of the mA and/or kV according to patient size and/or use of iterative reconstruction technique. CONTRAST:  75mL OMNIPAQUE  IOHEXOL  350 MG/ML SOLN COMPARISON:   08/24/2022 FINDINGS: Lower chest: No acute abnormality. Hepatobiliary: No focal liver abnormality is seen. No gallstones, gallbladder wall thickening, or biliary dilatation. Pancreas: Pancreas is well visualized with very mild peripancreatic inflammatory change consistent with acute pancreatitis. No pancreatic necrosis is seen. No mass is noted. Spleen: Normal in size without focal abnormality. Adrenals/Urinary Tract: Adrenal glands are within normal limits. Kidneys demonstrate a normal enhancement pattern bilaterally. Small cyst is noted within the left kidney. No follow-up is recommended. The collecting systems are within normal limits. Bladder is partially distended. Stomach/Bowel: Scattered diverticular change of the colon is noted without evidence of diverticulitis. The appendix is within normal limits. Small bowel is within normal limits. Postsurgical changes in the stomach are noted consistent with sleeve resection. Vascular/Lymphatic: Aortic atherosclerosis. No enlarged abdominal or pelvic lymph nodes. Reproductive: Status post hysterectomy. No adnexal masses. Other: No abdominal wall hernia or abnormality. No abdominopelvic ascites. Musculoskeletal: No acute or significant osseous findings. IMPRESSION: Mild changes of pancreatitis.  No necrosis is noted. Diverticulosis without diverticulitis. Electronically Signed   By: Oneil Devonshire M.D.   On: 07/20/2023 22:06   Scheduled Meds:  atorvastatin   20 mg Oral Daily   enoxaparin  (LOVENOX ) injection  55 mg Subcutaneous Q24H   famotidine   20 mg Oral QHS   ferrous sulfate  325 mg Oral Q breakfast   fluticasone   1 spray Each Nare Daily   hydrochlorothiazide   25 mg Oral Daily   sodium chloride  flush  3 mL Intravenous Q12H   Continuous Infusions:     LOS: 1 day    Time spent: 35 Mins    Darcel Dawley, MD [facility]   If 7PM-7AM, please contact night-coverage

## 2023-07-22 NOTE — Plan of Care (Signed)

## 2023-07-22 NOTE — Consult Note (Signed)
 Marissa Cox 30-Sep-1960  [id number].    Requesting provider: Vina Dasen, NP  Chief Complaint/Reason for Consult: Abdominal pain/Idiopathic Pancreatitis    HPI: Marissa Cox is a 63 y.o. female who presents with her third episode of pancreatitis in 3 years.  Patient states that she has been following with GI ever since the initial onset but has not been given an answer regarding the underlying cause.  Patient states that her most recent episode started on Saturday with pain in the epigastric region and right upper quadrant that radiates to her right flank.  Patient endorses nausea or vomiting with occasional diarrhea which she could not quantify.  Patient denies melena/hematochezia.  On arrival to patient's room, patient is adamant that she wants to leave and that she has to go back to work and does not want any further evaluation at this time.  Past Medical History: As below Prior Abdominal Surgeries: As below Blood Thinners: None Last PO intake: Today at 2:30 PM Last Colonoscopy: 2022 Allergies: See below Tobacco Use: Endorses half pack of cigarettes a day. Alcohol Use: Occasional per patient, once every 6 months Substance use: Denies   ROS: Review of Systems  Constitutional:  Negative for chills and fever.  Respiratory:  Negative for shortness of breath.   Gastrointestinal:  Positive for abdominal pain, diarrhea, nausea and vomiting. Negative for blood in stool.    Family History  Problem Relation Age of Onset   Bladder Cancer Mother    Breast cancer Maternal Aunt    Breast cancer Maternal Aunt    Ovarian cancer Maternal Aunt    Breast cancer Paternal Aunt    Breast cancer Paternal Aunt    Breast cancer Paternal Aunt    Colon cancer Neg Hx    Colon polyps Neg Hx    Esophageal cancer Neg Hx    Rectal cancer Neg Hx    Stomach cancer Neg Hx     Past Medical History:  Diagnosis Date   Allergy    Anemia    Arthritis    not diagnosed - in  knees per pt    COPD (chronic obstructive pulmonary disease) (HCC)    ? per PCP- ref to pulmonary    GERD (gastroesophageal reflux disease)    occ    Hyperlipidemia    Hypertension    Morbid obesity (HCC)    PONV (postoperative nausea and vomiting)     Past Surgical History:  Procedure Laterality Date   COLONOSCOPY     LAPAROSCOPIC GASTRIC SLEEVE RESECTION  12/22/2011   Procedure: LAPAROSCOPIC GASTRIC SLEEVE RESECTION;  Surgeon: Redell Faith, DO;  Location: [facility];  Service: General;  Laterality: N/A;   LAPAROSCOPIC TOTAL HYSTERECTOMY  1990   MULTIPLE TOOTH EXTRACTIONS  1981   TUBAL LIGATION  1990   UPPER GASTROINTESTINAL ENDOSCOPY     UPPER GI ENDOSCOPY  12/22/2011   Procedure: UPPER GI ENDOSCOPY;  Surgeon: Redell Faith, DO;  Location: [facility];  Service: General;;    Social History:  reports that she has been smoking cigarettes. She quit smokeless tobacco use about 11 years ago. She reports current alcohol use. She reports that she does not use drugs.  Allergies:  Allergies  Allergen Reactions   Amoxicillin -Pot Clavulanate     Other Reaction(s): diarrhea/yeast   Tramadol  Hcl     Other reaction(s): vomiting   Varenicline  Other (See Comments)    Chantix  - nightmares    Facility-Administered Medications Prior to Admission  Medication Dose  Route Frequency Provider Last Rate Last Admin   0.9 %  sodium chloride  infusion  500 mL Intravenous Once Pyrtle, Gordy HERO, MD       Medications Prior to Admission  Medication Sig Dispense Refill   albuterol  (PROVENTIL ) (2.5 MG/3ML) 0.083% nebulizer solution Take 3 mLs (1 vial) by nebulization every 6 (six) hours as needed. 75 mL 2   atorvastatin  (LIPITOR) 20 MG tablet Take 1 tablet (20 mg total) by mouth daily. 90 tablet 3   Cholecalciferol (VITAMIN D3 PO) Take 1 tablet by mouth daily.     famotidine  (PEPCID ) 20 MG tablet Take 1 tablet (20 mg total) by mouth at bedtime. 30 tablet 1   fluticasone  (FLONASE ) 50 MCG/ACT nasal spray Place 1 spray  into both nostrils daily.     lisinopril -hydrochlorothiazide  (ZESTORETIC ) 20-25 MG tablet Take 1 tablet by mouth daily. 90 tablet 4   Multiple Vitamin (MULTIVITAMIN WITH MINERALS) TABS tablet Take 1 tablet by mouth daily.     ondansetron  (ZOFRAN -ODT) 8 MG disintegrating tablet Take 1 tablet (8 mg total) by mouth every 8 (eight) hours as needed for nausea or vomiting. 20 tablet 0   albuterol  (VENTOLIN  HFA) 108 (90 Base) MCG/ACT inhaler Inhale 2 puffs into the lungs every 6 (six) hours as needed. (Patient not taking: Reported on 07/20/2023) 6.7 g 3   Budeson-Glycopyrrol-Formoterol  (BREZTRI  AEROSPHERE) 160-9-4.8 MCG/ACT AERO Inhale 2 puffs into the lungs 2 (two) times daily. (Patient not taking: Reported on 07/20/2023) 32.1 g 4   buPROPion  (WELLBUTRIN  XL) 150 MG 24 hr tablet Take 1 tablet (150 mg total) by mouth in the morning. (Patient not taking: Reported on 07/20/2023) 30 tablet 1   lisinopril -hydrochlorothiazide  (ZESTORETIC ) 20-25 MG tablet Take 1 tablet by mouth daily. (Patient not taking: Reported on 07/20/2023) 90 tablet 4   nicotine  (NICODERM CQ  - DOSED IN MG/24 HOURS) 14 mg/24hr patch Place 1 patch (14 mg total) onto the skin daily. (Patient not taking: Reported on 07/20/2023) 28 patch 0     Physical Exam: Blood pressure (!) 112/58, pulse (!) 56, temperature 98.1 F (36.7 C), temperature source Oral, resp. rate 18, height 5' 7 (1.702 m), weight 113.4 kg, SpO2 99%. Physical Exam Constitutional:      General: She is not in acute distress.    Appearance: She is not ill-appearing.  Abdominal:     General: Abdomen is protuberant. There is no distension.     Palpations: Abdomen is soft.     Tenderness: There is abdominal tenderness in the right upper quadrant and epigastric area. There is no guarding.   Skin:    General: Skin is warm and dry.   Neurological:     General: No focal deficit present.     Mental Status: She is alert and oriented to person, place, and time.      Results for  orders placed or performed during the hospital encounter of 07/20/23 (from the past 48 hours)  Lipase, blood     Status: Abnormal   Collection Time: 07/20/23  2:49 PM  Result Value Ref Range   Lipase 153 (H) 11 - 51 U/L    Comment: Performed at [facility], [address] [place], [place] [address]  Comprehensive metabolic panel     Status: Abnormal   Collection Time: 07/20/23  2:49 PM  Result Value Ref Range   Sodium 137 135 - 145 mmol/L   Potassium 3.6 3.5 - 5.1 mmol/L   Chloride 103 98 - 111 mmol/L   CO2  24 22 - 32 mmol/L   Glucose, Bld 115 (H) 70 - 99 mg/dL    Comment: Glucose reference range applies only to samples taken after fasting for at least 8 hours.   BUN 12 8 - 23 mg/dL   Creatinine, Ser 8.92 (H) 0.44 - 1.00 mg/dL   Calcium  9.3 8.9 - 10.3 mg/dL   Total Protein 7.0 6.5 - 8.1 g/dL   Albumin 3.4 (L) 3.5 - 5.0 g/dL   AST 22 15 - 41 U/L   ALT 16 0 - 44 U/L   Alkaline Phosphatase 82 38 - 126 U/L   Total Bilirubin 0.6 0.0 - 1.2 mg/dL   GFR, Estimated 58 (L) >60 mL/min    Comment: (NOTE) Calculated using the CKD-EPI Creatinine Equation (2021)    Anion gap 10 5 - 15    Comment: Performed at [facility], [address], [place], [place] [address]  CBC     Status: Abnormal   Collection Time: 07/20/23  2:49 PM  Result Value Ref Range   WBC 6.4 4.0 - 10.5 K/uL   RBC 4.87 3.87 - 5.11 MIL/uL   Hemoglobin 11.3 (L) 12.0 - 15.0 g/dL   HCT 62.4 63.9 - 53.9 %   MCV 77.0 (L) 80.0 - 100.0 fL   MCH 23.2 (L) 26.0 - 34.0 pg   MCHC 30.1 30.0 - 36.0 g/dL   RDW 86.1 88.4 - 84.4 %   Platelets 249 150 - 400 K/uL   nRBC 0.0 0.0 - 0.2 %    Comment: Performed at [facility], [address] [place], [place] [address]  Urinalysis, Routine w reflex microscopic -Urine, Clean Catch     Status: Abnormal   Collection Time: 07/20/23  2:49 PM  Result Value Ref Range   Color, Urine YELLOW YELLOW   APPearance HAZY (A) CLEAR   Specific Gravity, Urine 1.023 1.005 - 1.030   pH  6.0 5.0 - 8.0   Glucose, UA NEGATIVE NEGATIVE mg/dL   Hgb urine dipstick NEGATIVE NEGATIVE   Bilirubin Urine NEGATIVE NEGATIVE   Ketones, ur NEGATIVE NEGATIVE mg/dL   Protein, ur NEGATIVE NEGATIVE mg/dL   Nitrite NEGATIVE NEGATIVE   Leukocytes,Ua NEGATIVE NEGATIVE    Comment: Performed at [facility], [address], [place], [place] [address]  Triglycerides     Status: None   Collection Time: 07/21/23  1:07 AM  Result Value Ref Range   Triglycerides 63 <150 mg/dL    Comment: Performed at [facility], [address], [place], [place] [address]  HIV Antibody (routine testing w rflx)     Status: None   Collection Time: 07/21/23  1:07 AM  Result Value Ref Range   HIV Screen 4th Generation wRfx Non Reactive Non Reactive    Comment: Performed at [facility], [address] [place], [place] [address]  Comprehensive metabolic panel     Status: Abnormal   Collection Time: 07/21/23  1:07 AM  Result Value Ref Range   Sodium 139 135 - 145 mmol/L   Potassium 3.6 3.5 - 5.1 mmol/L   Chloride 103 98 - 111 mmol/L   CO2 23 22 - 32 mmol/L   Glucose, Bld 99 70 - 99 mg/dL    Comment: Glucose reference range applies only to samples taken after fasting for at least 8 hours.   BUN 11 8 - 23 mg/dL   Creatinine, Ser 8.87 (H) 0.44 - 1.00 mg/dL   Calcium  9.0  8.9 - 10.3 mg/dL   Total Protein 6.5 6.5 - 8.1 g/dL   Albumin 3.2 (L) 3.5 - 5.0 g/dL   AST 19 15 - 41 U/L   ALT 16 0 - 44 U/L   Alkaline Phosphatase 75 38 - 126 U/L   Total Bilirubin 0.6 0.0 - 1.2 mg/dL   GFR, Estimated 55 (L) >60 mL/min    Comment: (NOTE) Calculated using the CKD-EPI Creatinine Equation (2021)    Anion gap 13 5 - 15    Comment: Performed at [facility], [address] [place], [place] [address]  CBC     Status: Abnormal   Collection Time: 07/21/23  1:07 AM  Result Value Ref Range   WBC 6.1 4.0 - 10.5 K/uL   RBC 4.56 3.87 - 5.11 MIL/uL   Hemoglobin 10.6 (L) 12.0 - 15.0 g/dL   HCT 64.9 (L) 63.9  - 46.0 %   MCV 76.8 (L) 80.0 - 100.0 fL   MCH 23.2 (L) 26.0 - 34.0 pg   MCHC 30.3 30.0 - 36.0 g/dL   RDW 86.0 88.4 - 84.4 %   Platelets 212 150 - 400 K/uL   nRBC 0.0 0.0 - 0.2 %    Comment: Performed at [facility], [address], [place], [place] [address]  Lipase, blood     Status: None   Collection Time: 07/22/23  3:28 AM  Result Value Ref Range   Lipase 47 11 - 51 U/L    Comment: Performed at [facility], [address] [place], [place] [address]  Ferritin     Status: None   Collection Time: 07/22/23  3:28 AM  Result Value Ref Range   Ferritin 161 11 - 307 ng/mL    Comment: Performed at [facility], [address], [place], [place] [address]  CBC     Status: Abnormal   Collection Time: 07/22/23  3:28 AM  Result Value Ref Range   WBC 4.7 4.0 - 10.5 K/uL   RBC 4.26 3.87 - 5.11 MIL/uL   Hemoglobin 9.9 (L) 12.0 - 15.0 g/dL   HCT 67.8 (L) 63.9 - 53.9 %   MCV 75.4 (L) 80.0 - 100.0 fL   MCH 23.2 (L) 26.0 - 34.0 pg   MCHC 30.8 30.0 - 36.0 g/dL   RDW 86.2 88.4 - 84.4 %   Platelets 208 150 - 400 K/uL   nRBC 0.0 0.0 - 0.2 %    Comment: Performed at [facility], [address], [place], [place] [address]  Iron and TIBC     Status: Abnormal   Collection Time: 07/22/23  3:28 AM  Result Value Ref Range   Iron 30 28 - 170 ug/dL   TIBC 706 749 - 549 ug/dL   Saturation Ratios 10 (L) 10.4 - 31.8 %   UIBC 263 ug/dL    Comment: Performed at [facility], [address] [place], [place] [address]   MR ABDOMEN MRCP W WO CONTAST Result Date: 07/22/2023 CLINICAL DATA:  Pancreatitis. EXAM: MRI ABDOMEN WITHOUT AND WITH CONTRAST (INCLUDING MRCP) TECHNIQUE: Multiplanar multisequence MR imaging of the abdomen was performed both before and after the administration of intravenous contrast. Heavily T2-weighted images of the biliary and pancreatic ducts were obtained, and three-dimensional MRCP images were rendered by post processing. CONTRAST:  10mL GADAVIST   GADOBUTROL  1 MMOL/ML IV SOLN COMPARISON:  CT 07/20/2023 FINDINGS: Lower chest: No acute findings. Hepatobiliary: Motion artifact diminishes exam detail.  Within this limitation, no focal enhancing liver lesion identified. Pancreas: Normal appearance of the gallbladder. No gallstones, gallbladder wall thickening or pericholecystic inflammation. No bile duct dilatation. The common bile duct has a normal caliber measuring 3 mm. MRCP images are diminished due to motion artifact. On the coronal T2 weighted sequence there is an equivocal filling defect within the distal CBD measuring approximately 2 mm. This does not have the typical appearance of a gallstone and may be artifactual this is not confirmed on any of the other sequences. This Spleen: No main duct dilatation. Scratch diffuse pancreatic edema, most severe about the tail of pancreas. There is peripancreatic fluid and fat infiltration. No mass identified. No signs of pseudocyst. Adrenals/Urinary Tract: Unchanged left adrenal nodule measuring 2 cm. This is compatible with a benign adenoma, image 18/3. No follow-up imaging recommended. Bosniak class 1 left kidney cyst measures 1.1 cm, image 23/3. No follow-up imaging recommended. No suspicious kidney mass or obstructive uropathy. Stomach/Bowel: Visualized portions within the abdomen are unremarkable. Vascular/Lymphatic: Aortic atherosclerosis, no aneurysm. No adenopathy. Other:  Exam detail is diminished due to motion artifact. Musculoskeletal: No suspicious bone lesions identified. IMPRESSION: 1. Exam detail is diminished due to motion artifact. 2. Acute pancreatitis. No signs of pseudocyst or necrosis. 3. No bile duct dilatation. No signs of cholelithiasis. Equivocal filling defect within the distal CBD visualized on the coronal T2 weighted sequences only. Favor artifact over choledocholithiasis. 4. Benign left adrenal adenoma and Bosniak class 1 left kidney cyst. No follow-up imaging recommended. Electronically  Signed   By: Waddell Calk M.D.   On: 07/22/2023 04:52   MR 3D Recon At Scanner Result Date: 07/22/2023 CLINICAL DATA:  Pancreatitis. EXAM: MRI ABDOMEN WITHOUT AND WITH CONTRAST (INCLUDING MRCP) TECHNIQUE: Multiplanar multisequence MR imaging of the abdomen was performed both before and after the administration of intravenous contrast. Heavily T2-weighted images of the biliary and pancreatic ducts were obtained, and three-dimensional MRCP images were rendered by post processing. CONTRAST:  10mL GADAVIST  GADOBUTROL  1 MMOL/ML IV SOLN COMPARISON:  CT 07/20/2023 FINDINGS: Lower chest: No acute findings. Hepatobiliary: Motion artifact diminishes exam detail. Within this limitation, no focal enhancing liver lesion identified. Pancreas: Normal appearance of the gallbladder. No gallstones, gallbladder wall thickening or pericholecystic inflammation. No bile duct dilatation. The common bile duct has a normal caliber measuring 3 mm. MRCP images are diminished due to motion artifact. On the coronal T2 weighted sequence there is an equivocal filling defect within the distal CBD measuring approximately 2 mm. This does not have the typical appearance of a gallstone and may be artifactual this is not confirmed on any of the other sequences. This Spleen: No main duct dilatation. Scratch diffuse pancreatic edema, most severe about the tail of pancreas. There is peripancreatic fluid and fat infiltration. No mass identified. No signs of pseudocyst. Adrenals/Urinary Tract: Unchanged left adrenal nodule measuring 2 cm. This is compatible with a benign adenoma, image 18/3. No follow-up imaging recommended. Bosniak class 1 left kidney cyst measures 1.1 cm, image 23/3. No follow-up imaging recommended. No suspicious kidney mass or obstructive uropathy. Stomach/Bowel: Visualized portions within the abdomen are unremarkable. Vascular/Lymphatic: Aortic atherosclerosis, no aneurysm. No adenopathy. Other:  Exam detail is diminished due to  motion artifact. Musculoskeletal: No suspicious bone lesions identified. IMPRESSION: 1. Exam detail is diminished due to motion artifact. 2. Acute pancreatitis. No signs of pseudocyst or necrosis. 3. No bile duct dilatation. No signs of cholelithiasis. Equivocal filling defect within the distal CBD visualized on the coronal T2 weighted sequences  only. Favor artifact over choledocholithiasis. 4. Benign left adrenal adenoma and Bosniak class 1 left kidney cyst. No follow-up imaging recommended. Electronically Signed   By: Waddell Calk M.D.   On: 07/22/2023 04:52   CT ABDOMEN PELVIS W CONTRAST Result Date: 07/20/2023 CLINICAL DATA:  Abdominal pain and nausea for several days, initial encounter EXAM: CT ABDOMEN AND PELVIS WITH CONTRAST TECHNIQUE: Multidetector CT imaging of the abdomen and pelvis was performed using the standard protocol following bolus administration of intravenous contrast. RADIATION DOSE REDUCTION: This exam was performed according to the departmental dose-optimization program which includes automated exposure control, adjustment of the mA and/or kV according to patient size and/or use of iterative reconstruction technique. CONTRAST:  75mL OMNIPAQUE  IOHEXOL  350 MG/ML SOLN COMPARISON:  08/24/2022 FINDINGS: Lower chest: No acute abnormality. Hepatobiliary: No focal liver abnormality is seen. No gallstones, gallbladder wall thickening, or biliary dilatation. Pancreas: Pancreas is well visualized with very mild peripancreatic inflammatory change consistent with acute pancreatitis. No pancreatic necrosis is seen. No mass is noted. Spleen: Normal in size without focal abnormality. Adrenals/Urinary Tract: Adrenal glands are within normal limits. Kidneys demonstrate a normal enhancement pattern bilaterally. Small cyst is noted within the left kidney. No follow-up is recommended. The collecting systems are within normal limits. Bladder is partially distended. Stomach/Bowel: Scattered diverticular change  of the colon is noted without evidence of diverticulitis. The appendix is within normal limits. Small bowel is within normal limits. Postsurgical changes in the stomach are noted consistent with sleeve resection. Vascular/Lymphatic: Aortic atherosclerosis. No enlarged abdominal or pelvic lymph nodes. Reproductive: Status post hysterectomy. No adnexal masses. Other: No abdominal wall hernia or abnormality. No abdominopelvic ascites. Musculoskeletal: No acute or significant osseous findings. IMPRESSION: Mild changes of pancreatitis.  No necrosis is noted. Diverticulosis without diverticulitis. Electronically Signed   By: Oneil Devonshire M.D.   On: 07/20/2023 22:06    Anti-infectives (From admission, onward)    None       Assessment/Plan Idiopathic Pancreatitis   CT ( 6/24) shows- Mild changes of pancreatitis w/o necrosis. MRCP (6/25) shows - Acute pancreatitis without signs of pseudocyst or necrosis with no bile duct dilatation and no signs of cholelithiasis. WBC WNL, LFTs and Lipase normal.    Patient appears to have some tenderness in the right upper quadrant, epigastric region and right flank region. This is consistent with pancreatitis. There does not appear to be an infectious process and patient is hemodynamically stable and in no acute distress at this time. Surgical intervention is not urgent. It appears that patient was advised regarding outpatient follow-up with GI for EUS.  Patient is adamant on leaving at this time, so [facility] will be available for her to call our office for further advice regarding potential laparoscopic cholecystectomy with IOC.   FEN - Heart healthy diet, NPO AT MIDNIGHT IF SHE STAYS. VTE - SCDs, Lovenox  ID - Not indicated Foley - None Dispo - Admit to [facility].   I reviewed nursing notes, hospitalist notes, last 24 h vitals and pain scores, last 48 h intake and output, last 24 h labs and trends, and last 24 h imaging results.  This care required high   level of medical decision making.   Eulah Hammonds, PA-C [facility] 07/22/2023, 1:59 PM Please see Amion for pager number during day hours 7:00am-4:30pm

## 2023-07-22 NOTE — Plan of Care (Signed)

## 2023-07-22 NOTE — Telephone Encounter (Signed)
-----   Message from [facility] sent at 07/22/2023  2:07 PM EDT ----- PG, Thanks.  Adisyn Ruscitti, Please work on scheduling an Upper EUS as available for this patient (the team let patient know it is going to be out potentially in September/October). Rule out Choledocholithiasis and abnormal MRI as diagnosis. I defer any other followup as an outpatient to [facility] and CD scheduling if necessary. GM ----- Message ----- From: Kerman Vina HERO, NP Sent: 07/22/2023   2:07 PM EDT To: Aloha Wilhelmenia Raddle., MD  Hi, this note is in reference to the patient we discussed today who had a possible filling defect and bile duct stone on MRCP.  You were going to do an EUS but timing did not allow.  Patient needed to go home, get back to work and wanted to do this as an outpatient..  Please let me know if there is anything that you need me to do to facilitate this..  Thanks PG

## 2023-07-22 NOTE — Progress Notes (Signed)
 Transition of Care [facility]) - Inpatient Brief Assessment   Patient Details  Name: Marissa Cox MRN: [id number] Date of Birth: May 12, 1960  [facility] CM/SW Contact:    Rosaline JONELLE Joe, RN Phone Number: 07/22/2023, 10:40 AM   Clinical Narrative: Cm noted that patient admitted from home to hospital for acute pancreatitis.  No TOC needs at this time.  Patient should discharge today - seen by GI MD.   Transition of Care Asessment: Insurance and Status: (P) Insurance coverage has been reviewed Patient has primary care physician: (P) Yes Home environment has been reviewed: (P) from home Prior level of function:: (P) Independent Prior/Current Home Services: (P) No current home services Social Drivers of Health Review: (P) SDOH reviewed no interventions necessary Readmission risk has been reviewed: (P) Yes Transition of care needs: (P) no transition of care needs at this time

## 2023-07-22 NOTE — H&P (View-Only) (Signed)
 Will make npo after 0200 except meds for potential lap cholecystectomy on 6/27.

## 2023-07-23 ENCOUNTER — Inpatient Hospital Stay (HOSPITAL_COMMUNITY): Admitting: Anesthesiology

## 2023-07-23 ENCOUNTER — Inpatient Hospital Stay (HOSPITAL_COMMUNITY)

## 2023-07-23 ENCOUNTER — Encounter (HOSPITAL_COMMUNITY)
Admission: EM | Disposition: A | Discharge status: Home / Self Care | Payer: Self-pay | Source: Home / Self Care | Attending: Family Medicine

## 2023-07-23 ENCOUNTER — Encounter (HOSPITAL_COMMUNITY): Payer: Self-pay | Admitting: Internal Medicine

## 2023-07-23 ENCOUNTER — Other Ambulatory Visit: Payer: Self-pay

## 2023-07-23 DIAGNOSIS — J449 Chronic obstructive pulmonary disease, unspecified: Secondary | ICD-10-CM

## 2023-07-23 DIAGNOSIS — R933 Abnormal findings on diagnostic imaging of other parts of digestive tract: Secondary | ICD-10-CM

## 2023-07-23 DIAGNOSIS — F17210 Nicotine dependence, cigarettes, uncomplicated: Secondary | ICD-10-CM

## 2023-07-23 DIAGNOSIS — K859 Acute pancreatitis without necrosis or infection, unspecified: Secondary | ICD-10-CM

## 2023-07-23 DIAGNOSIS — I10 Essential (primary) hypertension: Secondary | ICD-10-CM

## 2023-07-23 HISTORY — PX: CHOLECYSTECTOMY: SHX55

## 2023-07-23 HISTORY — PX: INDOCYANINE GREEN FLUORESCENCE IMAGING (ICG): SHX7595

## 2023-07-23 LAB — CBC
HCT: 32.2 % — ABNORMAL LOW (ref 36.0–46.0)
Hemoglobin: 9.8 g/dL — ABNORMAL LOW (ref 12.0–15.0)
MCH: 23.4 pg — ABNORMAL LOW (ref 26.0–34.0)
MCHC: 30.4 g/dL (ref 30.0–36.0)
MCV: 76.8 fL — ABNORMAL LOW (ref 80.0–100.0)
Platelets: 209 10*3/uL (ref 150–400)
RBC: 4.19 MIL/uL (ref 3.87–5.11)
RDW: 13.4 % (ref 11.5–15.5)
WBC: 5 10*3/uL (ref 4.0–10.5)
nRBC: 0 % (ref 0.0–0.2)

## 2023-07-23 LAB — SURGICAL PCR SCREEN
MRSA, PCR: NEGATIVE
Staphylococcus aureus: NEGATIVE

## 2023-07-23 SURGERY — LAPAROSCOPIC CHOLECYSTECTOMY WITH INTRAOPERATIVE CHOLANGIOGRAM
Anesthesia: General | Site: Abdomen

## 2023-07-23 MED ORDER — BUPIVACAINE-EPINEPHRINE 0.25% -1:200000 IJ SOLN
INTRAMUSCULAR | Status: DC | PRN
  Administered 2023-07-23: 10 mL

## 2023-07-23 MED ORDER — PHENYLEPHRINE HCL-NACL 20-0.9 MG/250ML-% IV SOLN
INTRAVENOUS | Status: DC | PRN
  Administered 2023-07-23: 25 ug/min via INTRAVENOUS

## 2023-07-23 MED ORDER — PHENYLEPHRINE 80 MCG/ML (10ML) SYRINGE FOR IV PUSH (FOR BLOOD PRESSURE SUPPORT)
PREFILLED_SYRINGE | INTRAVENOUS | Status: AC
  Filled 2023-07-23: qty 10

## 2023-07-23 MED ORDER — MIDAZOLAM HCL 2 MG/2ML IJ SOLN
INTRAMUSCULAR | Status: DC | PRN
  Administered 2023-07-23: 2 mg via INTRAVENOUS

## 2023-07-23 MED ORDER — ONDANSETRON HCL 4 MG/2ML IJ SOLN
INTRAMUSCULAR | Status: AC
  Filled 2023-07-23: qty 2

## 2023-07-23 MED ORDER — LIDOCAINE 2% (20 MG/ML) 5 ML SYRINGE
INTRAMUSCULAR | Status: AC
Start: 2023-07-23 — End: 2023-07-23
  Filled 2023-07-23: qty 10

## 2023-07-23 MED ORDER — CHLORHEXIDINE GLUCONATE 0.12 % MT SOLN
OROMUCOSAL | Status: AC
  Administered 2023-07-23: 15 mL via OROMUCOSAL
  Filled 2023-07-23: qty 15

## 2023-07-23 MED ORDER — DEXAMETHASONE SODIUM PHOSPHATE 10 MG/ML IJ SOLN
INTRAMUSCULAR | Status: AC
  Filled 2023-07-23: qty 1

## 2023-07-23 MED ORDER — CEFAZOLIN SODIUM-DEXTROSE 2-3 GM-%(50ML) IV SOLR
INTRAVENOUS | Status: DC | PRN
  Administered 2023-07-23: 2 g via INTRAVENOUS

## 2023-07-23 MED ORDER — ROCURONIUM BROMIDE 10 MG/ML (PF) SYRINGE
PREFILLED_SYRINGE | INTRAVENOUS | Status: AC
  Filled 2023-07-23: qty 40

## 2023-07-23 MED ORDER — SODIUM CHLORIDE 0.9 % IV SOLN
INTRAVENOUS | Status: DC | PRN
  Administered 2023-07-23: 18 mL

## 2023-07-23 MED ORDER — PROPOFOL 10 MG/ML IV BOLUS
INTRAVENOUS | Status: DC | PRN
  Administered 2023-07-23: 140 mg via INTRAVENOUS

## 2023-07-23 MED ORDER — CHLORHEXIDINE GLUCONATE 0.12 % MT SOLN: 15.0000 mL | Freq: Once | OROMUCOSAL | Status: AC

## 2023-07-23 MED ORDER — ROCURONIUM BROMIDE 10 MG/ML (PF) SYRINGE
PREFILLED_SYRINGE | INTRAVENOUS | Status: DC | PRN
  Administered 2023-07-23: 20 mg via INTRAVENOUS
  Administered 2023-07-23: 60 mg via INTRAVENOUS

## 2023-07-23 MED ORDER — DROPERIDOL 2.5 MG/ML IJ SOLN: 0.6250 mg | Freq: Once | INTRAMUSCULAR | Status: DC | PRN

## 2023-07-23 MED ORDER — SODIUM CHLORIDE 0.9 % IR SOLN
Status: DC | PRN
  Administered 2023-07-23: 1

## 2023-07-23 MED ORDER — LIDOCAINE 2% (20 MG/ML) 5 ML SYRINGE
INTRAMUSCULAR | Status: DC | PRN
  Administered 2023-07-23: 100 mg via INTRAVENOUS

## 2023-07-23 MED ORDER — ORAL CARE MOUTH RINSE: 15.0000 mL | Freq: Once | OROMUCOSAL | Status: AC

## 2023-07-23 MED ORDER — PROPOFOL 10 MG/ML IV BOLUS
INTRAVENOUS | Status: AC
  Filled 2023-07-23: qty 20

## 2023-07-23 MED ORDER — HYDROMORPHONE HCL 1 MG/ML IJ SOLN
INTRAMUSCULAR | Status: AC
  Filled 2023-07-23: qty 1

## 2023-07-23 MED ORDER — MIDAZOLAM HCL 2 MG/2ML IJ SOLN
INTRAMUSCULAR | Status: AC
  Filled 2023-07-23: qty 2

## 2023-07-23 MED ORDER — MUPIROCIN 2 % EX OINT
1.0000 | TOPICAL_OINTMENT | Freq: Two times a day (BID) | CUTANEOUS | Status: DC
  Administered 2023-07-23 – 2023-07-24 (×3): 1 via NASAL
  Filled 2023-07-23: qty 22

## 2023-07-23 MED ORDER — SODIUM CHLORIDE 0.9% FLUSH: 10.0000 mL | INTRAVENOUS | Status: DC | PRN

## 2023-07-23 MED ORDER — SUGAMMADEX SODIUM 200 MG/2ML IV SOLN
INTRAVENOUS | Status: DC | PRN
  Administered 2023-07-23 (×2): 100 mg via INTRAVENOUS

## 2023-07-23 MED ORDER — ENOXAPARIN SODIUM 60 MG/0.6ML IJ SOSY
55.0000 mg | PREFILLED_SYRINGE | INTRAMUSCULAR | Status: DC
  Administered 2023-07-24: 55 mg via SUBCUTANEOUS
  Filled 2023-07-23: qty 0.6

## 2023-07-23 MED ORDER — PHENYLEPHRINE 80 MCG/ML (10ML) SYRINGE FOR IV PUSH (FOR BLOOD PRESSURE SUPPORT)
PREFILLED_SYRINGE | INTRAVENOUS | Status: DC | PRN
  Administered 2023-07-23: 40 ug via INTRAVENOUS

## 2023-07-23 MED ORDER — LACTATED RINGERS IV SOLN: INTRAVENOUS | Status: DC

## 2023-07-23 MED ORDER — FENTANYL CITRATE (PF) 250 MCG/5ML IJ SOLN
INTRAMUSCULAR | Status: AC
  Filled 2023-07-23: qty 5

## 2023-07-23 MED ORDER — INDOCYANINE GREEN 25 MG IV SOLR
1.2500 mg | Freq: Once | INTRAVENOUS | Status: AC
  Administered 2023-07-23: 1.25 mg via INTRAVENOUS
  Filled 2023-07-23: qty 10

## 2023-07-23 MED ORDER — SODIUM CHLORIDE 0.9 % IV SOLN: INTRAVENOUS | Status: DC

## 2023-07-23 MED ORDER — FENTANYL CITRATE (PF) 250 MCG/5ML IJ SOLN
INTRAMUSCULAR | Status: DC | PRN
  Administered 2023-07-23 (×2): 50 ug via INTRAVENOUS

## 2023-07-23 MED ORDER — CEFAZOLIN SODIUM 1 G IJ SOLR
INTRAMUSCULAR | Status: AC
  Filled 2023-07-23: qty 20

## 2023-07-23 MED ORDER — BUPIVACAINE-EPINEPHRINE (PF) 0.25% -1:200000 IJ SOLN
INTRAMUSCULAR | Status: AC
  Filled 2023-07-23: qty 30

## 2023-07-23 MED ORDER — 0.9 % SODIUM CHLORIDE (POUR BTL) OPTIME
TOPICAL | Status: DC | PRN
  Administered 2023-07-23: 1000 mL

## 2023-07-23 MED ORDER — HYDROMORPHONE HCL 1 MG/ML IJ SOLN
0.2500 mg | INTRAMUSCULAR | Status: DC | PRN
  Administered 2023-07-23: 0.5 mg via INTRAVENOUS

## 2023-07-23 MED ORDER — DEXAMETHASONE SODIUM PHOSPHATE 10 MG/ML IJ SOLN
INTRAMUSCULAR | Status: DC | PRN
  Administered 2023-07-23: 5 mg via INTRAVENOUS

## 2023-07-23 SURGICAL SUPPLY — 32 items
CANISTER SUCTION 3000ML PPV (SUCTIONS) ×2 IMPLANT
CHLORAPREP W/TINT 26 (MISCELLANEOUS) ×2 IMPLANT
CLIP APPLIE 5 13 M/L LIGAMAX5 (MISCELLANEOUS) ×2 IMPLANT
COVER MAYO STAND STRL (DRAPES) ×2 IMPLANT
COVER SURGICAL LIGHT HANDLE (MISCELLANEOUS) ×2 IMPLANT
DERMABOND ADVANCED .7 DNX12 (GAUZE/BANDAGES/DRESSINGS) IMPLANT
DRAPE C-ARM 42X120 X-RAY (DRAPES) ×2 IMPLANT
ELECTRODE REM PT RTRN 9FT ADLT (ELECTROSURGICAL) ×2 IMPLANT
GLOVE PI ORTHO PRO STRL 7.5 (GLOVE) ×2 IMPLANT
GOWN STRL REUS W/ TWL LRG LVL3 (GOWN DISPOSABLE) ×6 IMPLANT
GOWN STRL REUS W/ TWL XL LVL3 (GOWN DISPOSABLE) ×2 IMPLANT
GRASPER SUT TROCAR 14GX15 (MISCELLANEOUS) ×2 IMPLANT
IRRIGATION SUCT STRKRFLW 2 WTP (MISCELLANEOUS) ×2 IMPLANT
KIT BASIN OR (CUSTOM PROCEDURE TRAY) ×2 IMPLANT
KIT IMAGING PINPOINTPAQ (MISCELLANEOUS) IMPLANT
KIT TURNOVER KIT B (KITS) ×2 IMPLANT
NS IRRIG 1000ML POUR BTL (IV SOLUTION) ×2 IMPLANT
PAD ARMBOARD POSITIONER FOAM (MISCELLANEOUS) ×2 IMPLANT
POUCH RETRIEVAL ECOSAC 10 (ENDOMECHANICALS) ×2 IMPLANT
SCISSORS LAP 5X35 DISP (ENDOMECHANICALS) ×2 IMPLANT
SET CHOLANGIOGRAPH 5 50 .035 (SET/KITS/TRAYS/PACK) ×2 IMPLANT
SET TUBE SMOKE EVAC HIGH FLOW (TUBING) ×2 IMPLANT
SLEEVE ADV FIXATION 5X100MM (TROCAR) ×4 IMPLANT
SPECIMEN JAR SMALL (MISCELLANEOUS) ×2 IMPLANT
SUT MNCRL AB 4-0 PS2 18 (SUTURE) ×2 IMPLANT
SUT VICRYL 0 UR6 27IN ABS (SUTURE) IMPLANT
TOWEL GREEN STERILE (TOWEL DISPOSABLE) ×2 IMPLANT
TRAY LAPAROSCOPIC MC (CUSTOM PROCEDURE TRAY) ×2 IMPLANT
TROCAR ADV FIXATION 5X100MM (TROCAR) ×2 IMPLANT
TROCAR BALLN 12MMX100 BLUNT (TROCAR) ×2 IMPLANT
WARMER LAPAROSCOPE (MISCELLANEOUS) ×2 IMPLANT
WATER STERILE IRR 1000ML POUR (IV SOLUTION) ×2 IMPLANT

## 2023-07-23 NOTE — Plan of Care (Deleted)

## 2023-07-23 NOTE — Telephone Encounter (Signed)
 EUS has been set up for 10/07/23 at 115 pm at [facility] with GM

## 2023-07-23 NOTE — Telephone Encounter (Signed)
 The pt has been provided all information via My Chart, regular mail and will be advised at discharge.

## 2023-07-23 NOTE — Anesthesia Preprocedure Evaluation (Addendum)
 Anesthesia Evaluation  Patient identified by MRN, date of birth, ID band Patient awake    Reviewed: Allergy & Precautions, H&P , NPO status , Patient's Chart, lab work & pertinent test results  History of Anesthesia Complications (+) PONV and history of anesthetic complications  Airway Mallampati: II  TM Distance: >3 FB Neck ROM: full    Dental  (+) Dental Advisory Given, Edentulous Upper, Edentulous Lower   Pulmonary asthma , COPD, Current Smoker   Pulmonary exam normal breath sounds clear to auscultation       Cardiovascular Exercise Tolerance: Good hypertension, On Medications Normal cardiovascular exam Rhythm:regular Rate:Normal     Neuro/Psych  PSYCHIATRIC DISORDERS Anxiety     negative neurological ROS     GI/Hepatic Neg liver ROS,GERD  ,,  Endo/Other    Class 3 obesity  Renal/GU negative Renal ROS     Musculoskeletal  (+) Arthritis ,    Abdominal  (+) + obese  Peds  Hematology  (+) Blood dyscrasia, anemia   Anesthesia Other Findings   Reproductive/Obstetrics                             Anesthesia Physical Anesthesia Plan  ASA: 3  Anesthesia Plan: General   Post-op Pain Management: Ofirmev  IV (intra-op)*   Induction: Intravenous  PONV Risk Score and Plan: 4 or greater and Ondansetron , Dexamethasone  and Treatment may vary due to age or medical condition  Airway Management Planned: Oral ETT  Additional Equipment:   Intra-op Plan:   Post-operative Plan: Extubation in OR  Informed Consent: I have reviewed the patients History and Physical, chart, labs and discussed the procedure including the risks, benefits and alternatives for the proposed anesthesia with the patient or authorized representative who has indicated his/her understanding and acceptance.     Dental advisory given  Plan Discussed with: CRNA  Anesthesia Plan Comments:         Anesthesia Quick  Evaluation

## 2023-07-23 NOTE — Interval H&P Note (Signed)
 History and Physical Interval Note:  07/23/2023 2:54 PM  Marissa Cox  has presented today for surgery, with the diagnosis of Pancratitis.  The various methods of treatment have been discussed with the patient and family. After consideration of risks, benefits and other options for treatment, the patient has consented to  Procedure(s): LAPAROSCOPIC CHOLECYSTECTOMY WITH INTRAOPERATIVE CHOLANGIOGRAM (N/A) INDOCYANINE GREEN FLUORESCENCE IMAGING (ICG) (N/A) as a surgical intervention.  The patient's history has been reviewed, patient examined, no change in status, stable for surgery.  I have reviewed the patient's chart and labs.  Questions were answered to the patient's satisfaction.     Camellia Blush

## 2023-07-23 NOTE — Anesthesia Procedure Notes (Signed)
 Procedure Name: Intubation Date/Time: 07/23/2023 3:31 PM  Performed by: Mance Vallejo A, CRNAPre-anesthesia Checklist: Patient identified, Emergency Drugs available, Suction available and Patient being monitored Patient Re-evaluated:Patient Re-evaluated prior to induction Oxygen Delivery Method: Circle System Utilized Preoxygenation: Pre-oxygenation with 100% oxygen Induction Type: IV induction Ventilation: Mask ventilation without difficulty Laryngoscope Size: Mac and 4 Grade View: Grade II Tube type: Oral Tube size: 7.5 mm Number of attempts: 1 Airway Equipment and Method: Stylet and Oral airway Placement Confirmation: ETT inserted through vocal cords under direct vision, positive ETCO2 and breath sounds checked- equal and bilateral Secured at: 22 cm Tube secured with: Tape Dental Injury: Teeth and Oropharynx as per pre-operative assessment

## 2023-07-23 NOTE — Discharge Instructions (Signed)

## 2023-07-23 NOTE — Plan of Care (Addendum)
  Problem: Education: Goal: Knowledge of General Education information will improve Description: Including pain rating scale, medication(s)/side effects and non-pharmacologic comfort measures 07/23/2023 0719 by Taft Sari POUR, RN Outcome: Progressing 07/23/2023 0719 by Taft Sari POUR, RN Outcome: Progressing   Problem: Health Behavior/Discharge Planning: Goal: Ability to manage health-related needs will improve 07/23/2023 0719 by Taft Sari POUR, RN Outcome: Progressing 07/23/2023 0719 by Taft Sari POUR, RN Outcome: Progressing   Problem: Clinical Measurements: Goal: Ability to maintain clinical measurements within normal limits will improve 07/23/2023 0719 by Taft Sari POUR, RN Outcome: Progressing 07/23/2023 0719 by Taft Sari POUR, RN Outcome: Progressing Goal: Will remain free from infection 07/23/2023 0719 by Taft Sari POUR, RN Outcome: Progressing 07/23/2023 0719 by Taft Sari POUR, RN Outcome: Progressing Goal: Diagnostic test results will improve 07/23/2023 0719 by Taft Sari POUR, RN Outcome: Progressing 07/23/2023 0719 by Taft Sari POUR, RN Outcome: Progressing Goal: Respiratory complications will improve 07/23/2023 0719 by Taft Sari POUR, RN Outcome: Progressing 07/23/2023 0719 by Taft Sari POUR, RN Outcome: Progressing Goal: Cardiovascular complication will be avoided 07/23/2023 0719 by Taft Sari POUR, RN Outcome: Progressing 07/23/2023 0719 by Taft Sari POUR, RN Outcome: Progressing   Problem: Activity: Goal: Risk for activity intolerance will decrease 07/23/2023 0719 by Taft Sari POUR, RN Outcome: Progressing 07/23/2023 0719 by Taft Sari POUR, RN Outcome: Progressing   Problem: Nutrition: Goal: Adequate nutrition will be maintained 07/23/2023 0719 by Taft Sari POUR, RN Outcome: Progressing 07/23/2023 0719 by Taft Sari POUR, RN Outcome: Progressing   Problem: Coping: Goal: Level of anxiety will decrease 07/23/2023 0719 by Taft Sari POUR, RN Outcome: Progressing 07/23/2023 0719 by Taft Sari POUR, RN Outcome: Progressing   Problem: Elimination: Goal: Will not experience complications related to bowel motility 07/23/2023 0719 by Taft Sari POUR, RN Outcome: Progressing 07/23/2023 0719 by Taft Sari POUR, RN Outcome: Progressing Goal: Will not experience complications related to urinary retention 07/23/2023 0719 by Taft Sari POUR, RN Outcome: Progressing 07/23/2023 0719 by Taft Sari POUR, RN Outcome: Progressing   Problem: Pain Managment: Goal: General experience of comfort will improve and/or be controlled 07/23/2023 0719 by Taft Sari POUR, RN Outcome: Progressing 07/23/2023 0719 by Taft Sari POUR, RN Outcome: Progressing   Problem: Safety: Goal: Ability to remain free from injury will improve 07/23/2023 0719 by Taft Sari POUR, RN Outcome: Progressing 07/23/2023 0719 by Taft Sari POUR, RN Outcome: Progressing   Problem: Skin Integrity: Goal: Risk for impaired skin integrity will decrease 07/23/2023 0719 by Taft Sari POUR, RN Outcome: Progressing 07/23/2023 0719 by Taft Sari POUR, RN Outcome: Progressing

## 2023-07-23 NOTE — Transfer of Care (Signed)
 Immediate Anesthesia Transfer of Care Note  Patient: Marissa Cox  Procedure(s) Performed: DG CHOLANGIOGRAM OPERATIVE LAPAROSCOPIC CHOLECYSTECTOMY WITH INTRAOPERATIVE CHOLANGIOGRAM (Abdomen) INDOCYANINE GREEN FLUORESCENCE IMAGING (ICG)  Patient Location: PACU  Anesthesia Type:General  Level of Consciousness: awake, alert , and oriented  Airway & Oxygen Therapy: Patient Spontanous Breathing  Post-op Assessment: Report given to RN and Post -op Vital signs reviewed and stable  Post vital signs: Reviewed and stable  Last Vitals:  Vitals Value Taken Time  BP    Temp    Pulse 49 07/23/23 17:05  Resp 23 07/23/23 17:05  SpO2 98 % 07/23/23 17:05  Vitals shown include unfiled device data.  Last Pain:  Vitals:   07/23/23 1451  TempSrc: Oral  PainSc:          Complications: No notable events documented.

## 2023-07-23 NOTE — Op Note (Signed)
 Marissa Cox [id number] [birth year] 07/23/2023  Laparoscopic Cholecystectomy with near infrared fluorescent cholangiography & intraoperative cholangiogram procedure Note  Indications: This patient presents with symptomatic gallbladder disease and will undergo laparoscopic cholecystectomy.  Pre-operative Diagnosis: recurrent pancreatitis, questionable filling defect of distal common bile duct on MRCP, possible biliary pancreatitis  Post-operative Diagnosis: Same  Surgeon: Camellia Blush MD FACS  Assistants: none  Anesthesia: General endotracheal anesthesia  Procedure Details  The patient was seen again in the Holding Room. The risks, benefits, complications, treatment options, and expected outcomes were discussed with the patient. The possibilities of reaction to medication, pulmonary aspiration, perforation of viscus, bleeding, recurrent infection, finding a normal gallbladder, the need for additional procedures, failure to diagnose a condition, the possible need to convert to an open procedure, and creating a complication requiring transfusion or operation were discussed with the patient. The likelihood of improving the patient's symptoms with return to their baseline status is good.  The patient and/or family concurred with the proposed plan, giving informed consent. The site of surgery properly noted. The patient was taken to Operating Room, identified as Marissa Cox and the procedure verified as Laparoscopic Cholecystectomy with ICG dye.  A Time Out was held and the above information confirmed. Antibiotic prophylaxis was administered.    ICG dye was administered preoperatively.    General endotracheal anesthesia was then administered and tolerated well. After the induction, the abdomen was prepped with Chloraprep and draped in the sterile fashion. The patient was positioned in the supine position.  Access to the abdomen was obtained via the Optiview technique.  A small  incision was made to the left of the midline just below the subcostal margin.  Then using a 0 degree 5 mm laparoscope through a 5 mm trocar the laparoscope was advanced through all layers of the abdominal wall and carefully entered the abdominal cavity.  Pneumoperitoneum was smoothly established up to a patient pressure of 15 mmHg without any change in patient vital signs.  The laparoscope was advanced in the abdominal cavity was surveilled.  There is no evidence of injury to surrounding structures. We positioned the patient in reverse Trendelenburg, tilted slightly to the patient's left.  A 5 mm port was placed in the umbilical position under direct visualization.  The optical entry trocar was exchanged for a 12 mm trocar.  Two 5-mm ports were placed in the right upper quadrant. All skin incisions were infiltrated with a local anesthetic agent before making the incision and placing the trocars.     The gallbladder was identified, the fundus grasped and retracted cephalad.  I quickly realized that my 12 mm trocar had a lot of torque in it so it really was not that helpful in dissection.  So ended up placing an additional 5 mm trocar slightly higher up in the subxiphoid position which allowed much more ease in mobilizing the infundibulum.  Adhesions were lysed bluntly and with the electrocautery where indicated, taking care not to injure any adjacent organs or viscus. The infundibulum was grasped and retracted laterally, exposing the peritoneum overlying the triangle of Calot. This was then divided and exposed in a blunt fashion. A critical view of the cystic duct and cystic artery was obtained.  The cystic duct was clearly identified and bluntly dissected circumferentially.  Utilizing the St[employer] camera system near infrared fluorescent cholangiography activity was visualized in the liver, cystic duct, common hepatic duct and common bile duct and small bowel.  This served as a secondary confirmation of our  anatomy.  Since the patient had a history of recurrent pancreatitis and there was a questionable filling defect on MRI of her common bile duct I went ahead and performed a formal intraoperative cholangiogram.  I went ahead and clipped the cystic artery.  2 clips were placed on the proximal site and 1 as it entered the gallbladder but I did not transected yet.  A small incision was made in the cystic duct as it entered the gallbladder after a clip had been placed on the cystic duct as it entered the gallbladder.  A Cook cholangiogram catheter was then introduced through the abdominal wall and threaded into the ductotomy site into the cystic duct.  We then performed a cholangiogram.  There was prompt opacification of the cystic duct, left and right hepatic duct, common hepatic duct and common bile duct and duodenum.  There was some extravasation of contrast from around the cholangiogram catheter as it entered the cystic duct.  I did not notice any overt filling defect.  We returned laparoscopically.  The cholangiogram catheter was removed.  The cystic duct was then ligated with clips and divided. The cystic artery which had been identified & dissected free and had been ligated with clips was divided  The gallbladder was dissected from the liver bed in retrograde fashion with the electrocautery. The gallbladder was removed and placed in an Ecco sac. The liver bed was irrigated and inspected. Hemostasis was achieved with the electrocautery. Copious irrigation was utilized and was repeatedly aspirated until clear.   The gallbladder and Ecco sac were then removed through the left upper quadrant port site.  We again inspected the right upper quadrant for hemostasis.  The left upper quadrant port site was closed with 0 Vicryl using a PMI suture passer with laparoscopic guidance.  The left upper quadrant closure was inspected and there was no air leak and nothing trapped within the closure. Pneumoperitoneum was released  as we removed the trocars.  4-0 Monocryl was used to close the skin.  Dermabond was applied. The patient was then extubated and brought to the recovery room in stable condition. Instrument, sponge, and needle counts were correct at closure and at the conclusion of the case.   Findings: Positive critical view Normal intraoperative cholangiogram per my interpretation  Estimated Blood Loss: Minimal         Drains: none         Specimens: Gallbladder           Complications: None; patient tolerated the procedure well.         Disposition: PACU - hemodynamically stable.         Condition: stable  Camellia HERO. Tanda, MD, FACS General, Bariatric, & Minimally Invasive Surgery [facility],  [facility]

## 2023-07-23 NOTE — Progress Notes (Signed)
 PROGRESS NOTE    Marissa Cox  FMW:[id number] DOB: Jun 15, 1960 DOA: 07/20/2023 PCP: Elliot Charm, MD   Brief Narrative: This 63 yrs old female with medical history significant for lumbar radiculopathy, bilateral osteoarthritis, anxiety, depression, adjustment disorder, COPD, hyperlipidemia, essential hypertension, iron deficiency anemia, history of recurrent pancreatitis, and chronic tobacco use presented to the ED complaining of abdominal pain for three days.  She describes pain in the right upper quadrant radiating towards the back associated with nausea but without vomiting.  Pertinent labs in the ED include lipase 153.  CT A&P shows mild changes of pancreatitis without any necrosis.  Patient was admitted for further evaluation.  GI was consulted.  Assessment & Plan:   Principal Problem:   Acute pancreatitis Active Problems:   COPD (chronic obstructive pulmonary disease) (HCC)   Hyperlipidemia   Essential hypertension   Iron deficiency anemia   GAD (generalized anxiety disorder)   Continuous dependence on cigarette smoking   Acute pancreatitis: History of recurrent pancreatitis-third episode. She presented in the ED with abdominal pain, Nausea and elevated Lipase. She describes pain radiating towards the right side of her back as bandlike sensation. She has 2 episodes of previous pancreatitis in the past.  Has been seen by [facility] GI outpatient. Elevated lipase 153.  CBC > No evidence of leukocytosis.  stable H&H and low MCV. CT A/P showed evidence for acute pancreatitis without any necrosis.  No evidence of cholelithiasis/cholecystitis/biliary ductal dilation. Patient reports social drinking,  has never been a heavy drinker in her life. Unclear etiology of acute pancreatitis at this time given there is no evidence of gallstone. Triglyceride levels normal. Continue clear liquid diet, Pain management and aggressive hydration with LR 150 cc/h. GI recommended ERCP  but that can be done outpatient. General surgery consulted to see if patient can have cholecystectomy as this is her third episodes of pancreatitis. Patient is tentatively scheduled for lap chole today.   History of COPD: Stable.  Continue albuterol  as needed.   Hyperlipidemia: Continue Lipitor.   Essential hypertension: Continue lisinopril , hydrochlorothiazide .   Chronic iron deficiency anemia : Continue oral iron supplement.   Generalized anxiety disorder: Not taking any antianxiety medication anymore.   History of smoking cigarette: Reported quit smoking few years ago.  DVT prophylaxis: Lovenox  Code Status: Full code Family Communication: No family at bedside Disposition Plan:   Status is: Inpatient Remains inpatient appropriate because:     Admitted for recurrent pancreatitis requiring conservative management.  Surgery and gastroenterology consulted. Patient is tentatively scheduled for lap chole today.  Consultants:  Gastroenterology  Procedures:CT A/P  Antimicrobials:  Anti-infectives (From admission, onward)    None      Subjective: Patient was seen and examined at bedside. Overnight events noted. Patient denies any nausea,  reports abdominal pain has improved. She is tentatively scheduled for lap chole today.  Objective: Vitals:   07/22/23 1747 07/23/23 0111 07/23/23 0529 07/23/23 0744  BP: (!) 142/74 (!) 142/62 (!) 142/57 120/71  Pulse: (!) 49 (!) 48 (!) 44 (!) 48  Resp: 18 17 17 17   Temp: 97.8 F (36.6 C) 98 F (36.7 C) 97.8 F (36.6 C) 98 F (36.7 C)  TempSrc:  Oral Oral   SpO2: 100% 100% 99% 100%  Weight:      Height:        Intake/Output Summary (Last 24 hours) at 07/23/2023 1320 Last data filed at 07/23/2023 0600 Gross per 24 hour  Intake 250 ml  Output --  Net 250 ml  Filed Weights   07/20/23 1447  Weight: 113.4 kg    Examination:  General exam: Appears calm and comfortable, not in any acute distress. Respiratory system:  CTA Bilaterally . Respiratory effort normal.  RR 14 Cardiovascular system: S1 & S2 heard, RRR. No JVD, murmurs, rubs, gallops or clicks.  Gastrointestinal system: Abdomen is non distended, soft and  Mildly tender in RUQ. Normal bowel sounds heard. Central nervous system: Alert and oriented x 3. No focal neurological deficits. Extremities: No edema, no cyanosis, no clubbing.. Skin: No rashes, lesions or ulcers Psychiatry: Judgement and insight appear normal. Mood & affect appropriate.     Data Reviewed: I have personally reviewed following labs and imaging studies  CBC: Recent Labs  Lab 07/20/23 1449 07/21/23 0107 07/22/23 0328 07/23/23 0109  WBC 6.4 6.1 4.7 5.0  HGB 11.3* 10.6* 9.9* 9.8*  HCT 37.5 35.0* 32.1* 32.2*  MCV 77.0* 76.8* 75.4* 76.8*  PLT 249 212 208 209   Basic Metabolic Panel: Recent Labs  Lab 07/20/23 1449 07/21/23 0107  NA 137 139  K 3.6 3.6  CL 103 103  CO2 24 23  GLUCOSE 115* 99  BUN 12 11  CREATININE 1.07* 1.12*  CALCIUM  9.3 9.0   GFR: Estimated Creatinine Clearance: 66.8 mL/min (A) (by C-G formula based on SCr of 1.12 mg/dL (H)). Liver Function Tests: Recent Labs  Lab 07/20/23 1449 07/21/23 0107  AST 22 19  ALT 16 16  ALKPHOS 82 75  BILITOT 0.6 0.6  PROT 7.0 6.5  ALBUMIN 3.4* 3.2*   Recent Labs  Lab 07/20/23 1449 07/22/23 0328  LIPASE 153* 47   No results for input(s): AMMONIA in the last 168 hours. Coagulation Profile: No results for input(s): INR, PROTIME in the last 168 hours. Cardiac Enzymes: No results for input(s): CKTOTAL, CKMB, CKMBINDEX, TROPONINI in the last 168 hours. BNP (last 3 results) No results for input(s): PROBNP in the last 8760 hours. HbA1C: No results for input(s): HGBA1C in the last 72 hours. CBG: No results for input(s): GLUCAP in the last 168 hours. Lipid Profile: Recent Labs    07/21/23 0107  TRIG 63   Thyroid  Function Tests: No results for input(s): TSH, T4TOTAL, FREET4,  T3FREE, THYROIDAB in the last 72 hours. Anemia Panel: Recent Labs    07/22/23 0328  FERRITIN 161  TIBC 293  IRON 30   Sepsis Labs: No results for input(s): PROCALCITON, LATICACIDVEN in the last 168 hours.  No results found for this or any previous visit (from the past 240 hours).   Radiology Studies: MR ABDOMEN MRCP W WO CONTAST Result Date: 07/22/2023 CLINICAL DATA:  Pancreatitis. EXAM: MRI ABDOMEN WITHOUT AND WITH CONTRAST (INCLUDING MRCP) TECHNIQUE: Multiplanar multisequence MR imaging of the abdomen was performed both before and after the administration of intravenous contrast. Heavily T2-weighted images of the biliary and pancreatic ducts were obtained, and three-dimensional MRCP images were rendered by post processing. CONTRAST:  10mL GADAVIST  GADOBUTROL  1 MMOL/ML IV SOLN COMPARISON:  CT 07/20/2023 FINDINGS: Lower chest: No acute findings. Hepatobiliary: Motion artifact diminishes exam detail. Within this limitation, no focal enhancing liver lesion identified. Pancreas: Normal appearance of the gallbladder. No gallstones, gallbladder wall thickening or pericholecystic inflammation. No bile duct dilatation. The common bile duct has a normal caliber measuring 3 mm. MRCP images are diminished due to motion artifact. On the coronal T2 weighted sequence there is an equivocal filling defect within the distal CBD measuring approximately 2 mm. This does not have the typical appearance of a gallstone and  may be artifactual this is not confirmed on any of the other sequences. This Spleen: No main duct dilatation. Scratch diffuse pancreatic edema, most severe about the tail of pancreas. There is peripancreatic fluid and fat infiltration. No mass identified. No signs of pseudocyst. Adrenals/Urinary Tract: Unchanged left adrenal nodule measuring 2 cm. This is compatible with a benign adenoma, image 18/3. No follow-up imaging recommended. Bosniak class 1 left kidney cyst measures 1.1 cm, image 23/3.  No follow-up imaging recommended. No suspicious kidney mass or obstructive uropathy. Stomach/Bowel: Visualized portions within the abdomen are unremarkable. Vascular/Lymphatic: Aortic atherosclerosis, no aneurysm. No adenopathy. Other:  Exam detail is diminished due to motion artifact. Musculoskeletal: No suspicious bone lesions identified. IMPRESSION: 1. Exam detail is diminished due to motion artifact. 2. Acute pancreatitis. No signs of pseudocyst or necrosis. 3. No bile duct dilatation. No signs of cholelithiasis. Equivocal filling defect within the distal CBD visualized on the coronal T2 weighted sequences only. Favor artifact over choledocholithiasis. 4. Benign left adrenal adenoma and Bosniak class 1 left kidney cyst. No follow-up imaging recommended. Electronically Signed   By: Waddell Calk M.D.   On: 07/22/2023 04:52   MR 3D Recon At Scanner Result Date: 07/22/2023 CLINICAL DATA:  Pancreatitis. EXAM: MRI ABDOMEN WITHOUT AND WITH CONTRAST (INCLUDING MRCP) TECHNIQUE: Multiplanar multisequence MR imaging of the abdomen was performed both before and after the administration of intravenous contrast. Heavily T2-weighted images of the biliary and pancreatic ducts were obtained, and three-dimensional MRCP images were rendered by post processing. CONTRAST:  10mL GADAVIST  GADOBUTROL  1 MMOL/ML IV SOLN COMPARISON:  CT 07/20/2023 FINDINGS: Lower chest: No acute findings. Hepatobiliary: Motion artifact diminishes exam detail. Within this limitation, no focal enhancing liver lesion identified. Pancreas: Normal appearance of the gallbladder. No gallstones, gallbladder wall thickening or pericholecystic inflammation. No bile duct dilatation. The common bile duct has a normal caliber measuring 3 mm. MRCP images are diminished due to motion artifact. On the coronal T2 weighted sequence there is an equivocal filling defect within the distal CBD measuring approximately 2 mm. This does not have the typical appearance of a  gallstone and may be artifactual this is not confirmed on any of the other sequences. This Spleen: No main duct dilatation. Scratch diffuse pancreatic edema, most severe about the tail of pancreas. There is peripancreatic fluid and fat infiltration. No mass identified. No signs of pseudocyst. Adrenals/Urinary Tract: Unchanged left adrenal nodule measuring 2 cm. This is compatible with a benign adenoma, image 18/3. No follow-up imaging recommended. Bosniak class 1 left kidney cyst measures 1.1 cm, image 23/3. No follow-up imaging recommended. No suspicious kidney mass or obstructive uropathy. Stomach/Bowel: Visualized portions within the abdomen are unremarkable. Vascular/Lymphatic: Aortic atherosclerosis, no aneurysm. No adenopathy. Other:  Exam detail is diminished due to motion artifact. Musculoskeletal: No suspicious bone lesions identified. IMPRESSION: 1. Exam detail is diminished due to motion artifact. 2. Acute pancreatitis. No signs of pseudocyst or necrosis. 3. No bile duct dilatation. No signs of cholelithiasis. Equivocal filling defect within the distal CBD visualized on the coronal T2 weighted sequences only. Favor artifact over choledocholithiasis. 4. Benign left adrenal adenoma and Bosniak class 1 left kidney cyst. No follow-up imaging recommended. Electronically Signed   By: Waddell Calk M.D.   On: 07/22/2023 04:52   Scheduled Meds:  atorvastatin   20 mg Oral Daily   enoxaparin  (LOVENOX ) injection  55 mg Subcutaneous Q24H   famotidine   20 mg Oral QHS   ferrous sulfate   325 mg Oral Q breakfast   fluticasone   1 spray Each Nare Daily   hydrochlorothiazide   25 mg Oral Daily   mupirocin ointment  1 Application Nasal BID   sodium chloride  flush  3 mL Intravenous Q12H   Continuous Infusions:  sodium chloride  20 mL/hr at 07/23/23 1312      LOS: 2 days    Time spent: 35 Mins    Darcel Dawley, MD [facility]   If 7PM-7AM, please contact night-coverage

## 2023-07-23 NOTE — Plan of Care (Signed)

## 2023-07-24 DIAGNOSIS — K859 Acute pancreatitis without necrosis or infection, unspecified: Secondary | ICD-10-CM | POA: Diagnosis not present

## 2023-07-24 LAB — COMPREHENSIVE METABOLIC PANEL WITH GFR
ALT: 25 U/L (ref 0–44)
AST: 41 U/L (ref 15–41)
Albumin: 3.1 g/dL — ABNORMAL LOW (ref 3.5–5.0)
Alkaline Phosphatase: 79 U/L (ref 38–126)
Anion gap: 10 (ref 5–15)
BUN: 12 mg/dL (ref 8–23)
CO2: 26 mmol/L (ref 22–32)
Calcium: 9 mg/dL (ref 8.9–10.3)
Chloride: 101 mmol/L (ref 98–111)
Creatinine, Ser: 1.11 mg/dL — ABNORMAL HIGH (ref 0.44–1.00)
GFR, Estimated: 56 mL/min — ABNORMAL LOW (ref >60)
Glucose, Bld: 137 mg/dL — ABNORMAL HIGH (ref 70–99)
Potassium: 4.1 mmol/L (ref 3.5–5.1)
Sodium: 137 mmol/L (ref 135–145)
Total Bilirubin: 0.6 mg/dL (ref 0.0–1.2)
Total Protein: 6.7 g/dL (ref 6.5–8.1)

## 2023-07-24 LAB — CBC
HCT: 36 % (ref 36.0–46.0)
Hemoglobin: 10.7 g/dL — ABNORMAL LOW (ref 12.0–15.0)
MCH: 22.8 pg — ABNORMAL LOW (ref 26.0–34.0)
MCHC: 29.7 g/dL — ABNORMAL LOW (ref 30.0–36.0)
MCV: 76.8 fL — ABNORMAL LOW (ref 80.0–100.0)
Platelets: 229 10*3/uL (ref 150–400)
RBC: 4.69 MIL/uL (ref 3.87–5.11)
RDW: 13.6 % (ref 11.5–15.5)
WBC: 9.1 10*3/uL (ref 4.0–10.5)
nRBC: 0 % (ref 0.0–0.2)

## 2023-07-24 LAB — PHOSPHORUS: Phosphorus: 4.3 mg/dL (ref 2.5–4.6)

## 2023-07-24 LAB — MAGNESIUM: Magnesium: 1.8 mg/dL (ref 1.7–2.4)

## 2023-07-24 MED ORDER — OXYCODONE HCL 5 MG PO TABS: 5.0000 mg | ORAL_TABLET | ORAL | 0 refills | Status: AC | PRN

## 2023-07-24 MED ORDER — FERROUS SULFATE 325 (65 FE) MG PO TABS: 325.0000 mg | ORAL_TABLET | Freq: Every day | ORAL | 0 refills | Status: AC

## 2023-07-24 MED ORDER — FERROUS SULFATE 325 (65 FE) MG PO TABS
325.0000 mg | ORAL_TABLET | Freq: Every day | ORAL | 0 refills | Status: DC
  Filled 2023-07-24: qty 30, 30d supply, fill #0

## 2023-07-24 NOTE — Discharge Summary (Signed)
 Physician Discharge Summary  Omeka Holben [place] FMW:[id number] DOB: [birth year] DOA: 07/20/2023  PCP: Elliot Charm, MD  Admit date: 07/20/2023  Discharge date: 07/24/2023  Admitted From: Home  Disposition:  Home.  Recommendations for Outpatient Follow-up:  Follow up with PCP in 1-2 weeks. Please obtain BMP/CBC in one week. Advised to follow-up with general surgery in 1 week for follow-up. Patient is status post lap cholecystectomy.  Home Health:None Equipment/Devices:None  Discharge Condition: Stable. CODE STATUS:Full code Diet recommendation: Heart Healthy   [facility] Course: This 63 yrs old female with medical history significant for lumbar radiculopathy, bilateral osteoarthritis, anxiety, depression, adjustment disorder, COPD, hyperlipidemia, essential hypertension, iron deficiency anemia, history of recurrent pancreatitis, and chronic tobacco use presented to the ED complaining of abdominal pain for three days.  She describes pain in the right upper quadrant radiating towards the back associated with nausea but without vomiting.  Pertinent labs in the ED include lipase 153.  CT A&P shows mild changes of pancreatitis without any necrosis.  Patient was admitted for further evaluation.  GI was consulted.  Triglyceride levels were normal, ultrasound shows no gallstones.  Patient denies any alcohol use. Since this was her third episode of idiopathic pancreatitis.  General surgery was consulted.  Patient underwent laparoscopy cholecystectomy with intraoperative cholangiogram,  tolerated well.  General surgery signed off,  recommended patient can be discharged and follow-up outpatient.  Discharge Diagnoses:  Principal Problem:   Acute pancreatitis Active Problems:   COPD (chronic obstructive pulmonary disease) (HCC)   Hyperlipidemia   Essential hypertension   Iron deficiency anemia   GAD (generalized anxiety disorder)   Continuous dependence on cigarette  smoking  Acute pancreatitis: History of recurrent pancreatitis-third episode. She presented in the ED with abdominal pain, Nausea and elevated Lipase. She describes pain radiating towards the right side of her back as bandlike sensation. She has 2 episodes of previous pancreatitis in the past.  Has been seen by [facility] outpatient. Elevated lipase 153.  CBC > No evidence of leukocytosis.  stable H&H and low MCV. CT A/P showed evidence for acute pancreatitis without any necrosis.  No evidence of cholelithiasis /cholecystitis /biliary ductal dilation. Patient reports social drinking,  has never been a heavy drinker in her life. Unclear etiology of acute pancreatitis at this time given there is no evidence of gallstone. Triglyceride levels normal. Continue clear liquid diet, Pain management and aggressive hydration with LR 150 cc/h. GI recommended ERCP but that can be done outpatient. General surgery consulted to see if patient can have cholecystectomy as this is her third episodes of pancreatitis. And underwent successful laparoscopic cholecystectomy tolerated well. Patient feels much better,  tolerated soft diet,  wants to be discharged home.   History of COPD: Stable. Continue albuterol  as needed.   Hyperlipidemia: Continue Lipitor.   Essential hypertension: Continue lisinopril , hydrochlorothiazide .   Chronic iron deficiency anemia : Continue oral iron supplement.   Generalized anxiety disorder: Not taking any antianxiety medication anymore.   History of smoking cigarette: Reported quit smoking few years ago.  Discharge Instructions  Discharge Instructions     Call MD for:  difficulty breathing, headache or visual disturbances   Complete by: As directed    Call MD for:  persistant dizziness or light-headedness   Complete by: As directed    Call MD for:  persistant nausea and vomiting   Complete by: As directed    Diet - low sodium heart healthy   Complete by: As  directed    Diet  Carb Modified   Complete by: As directed    Discharge instructions   Complete by: As directed    Advised to follow-up with primary care physician in 1 week. Advised to follow-up with general surgery in 1 week for follow-up.   Increase activity slowly   Complete by: As directed    No wound care   Complete by: As directed       Allergies as of 07/24/2023       Reactions   Amoxicillin -pot Clavulanate    Other Reaction(s): diarrhea/yeast   Tramadol  Hcl    Other reaction(s): vomiting   Varenicline  Other (See Comments)   Chantix  - nightmares        Medication List     STOP taking these medications    Breztri  Aerosphere 160-9-4.8 MCG/ACT Aero inhaler Generic drug: budesonide -glycopyrrolate -formoterol    buPROPion  150 MG 24 hr tablet Commonly known as: WELLBUTRIN  XL   nicotine  14 mg/24hr patch Commonly known as: NICODERM CQ  - dosed in mg/24 hours       TAKE these medications    albuterol  (2.5 MG/3ML) 0.083% nebulizer solution Commonly known as: PROVENTIL  Take 3 mLs (1 vial) by nebulization every 6 (six) hours as needed. What changed: Another medication with the same name was removed. Continue taking this medication, and follow the directions you see here.   atorvastatin  20 MG tablet Commonly known as: LIPITOR Take 1 tablet (20 mg total) by mouth daily.   famotidine  20 MG tablet Commonly known as: PEPCID  Take 1 tablet (20 mg total) by mouth at bedtime.   ferrous sulfate  325 (65 FE) MG tablet Take 1 tablet (325 mg total) by mouth daily with breakfast. Start taking on: July 25, 2023   fluticasone  50 MCG/ACT nasal spray Commonly known as: FLONASE  Place 1 spray into both nostrils daily.   lisinopril -hydrochlorothiazide  20-25 MG tablet Commonly known as: ZESTORETIC  Take 1 tablet by mouth daily. What changed: Another medication with the same name was removed. Continue taking this medication, and follow the directions you see here.    multivitamin with minerals Tabs tablet Take 1 tablet by mouth daily.   ondansetron  8 MG disintegrating tablet Commonly known as: ZOFRAN -ODT Take 1 tablet (8 mg total) by mouth every 8 (eight) hours as needed for nausea or vomiting.   oxyCODONE  5 MG immediate release tablet Commonly known as: Roxicodone  Take 1 tablet (5 mg total) by mouth every 4 (four) hours as needed for up to 3 days for severe pain (pain score 7-10).   VITAMIN D3 PO Take 1 tablet by mouth daily.        Follow-up Information     Maczis, Puja Gosai, PA-C Follow up in 3 week(s).   Specialty: General Surgery Why: Arrive 30 minutes prior to your appointment time, Please bring your insurance card and photo ID Contact information: [address] [facility] [place] [address] [phone]         Elliot Charm, MD Follow up in 1 week(s).   Specialty: Internal Medicine Contact information: [address] [place] [address] [phone]                Allergies  Allergen Reactions   Amoxicillin -Pot Clavulanate     Other Reaction(s): diarrhea/yeast   Tramadol  Hcl     Other reaction(s): vomiting   Varenicline  Other (See Comments)    Chantix  - nightmares    Consultations: General Surgery GI   Procedures/Studies: DG Cholangiogram Operative Result Date: 07/24/2023  CLINICAL DATA:  Surgery, elective.  Recurrent pancreatitis. EXAM: INTRAOPERATIVE CHOLANGIOGRAM TECHNIQUE: Cholangiographic images from the C-arm fluoroscopic device were submitted for interpretation post-operatively. Radiation Exposure Index (as provided by the fluoroscopic device): 7.95 mGy Kerma COMPARISON:  CT 07/20/2023 FINDINGS: Contrast opacification of the cystic duct, intrahepatic biliary system and extrahepatic biliary system. Contrast drains into the duodenum. The biliary system is not dilated. No definite filling defects or stones. IMPRESSION: Normal intraoperative  cholangiogram. Electronically Signed   By: Juliene Balder M.D.   On: 07/24/2023 08:31   MR ABDOMEN MRCP W WO CONTAST Result Date: 07/22/2023 CLINICAL DATA:  Pancreatitis. EXAM: MRI ABDOMEN WITHOUT AND WITH CONTRAST (INCLUDING MRCP) TECHNIQUE: Multiplanar multisequence MR imaging of the abdomen was performed both before and after the administration of intravenous contrast. Heavily T2-weighted images of the biliary and pancreatic ducts were obtained, and three-dimensional MRCP images were rendered by post processing. CONTRAST:  10mL GADAVIST  GADOBUTROL  1 MMOL/ML IV SOLN COMPARISON:  CT 07/20/2023 FINDINGS: Lower chest: No acute findings. Hepatobiliary: Motion artifact diminishes exam detail. Within this limitation, no focal enhancing liver lesion identified. Pancreas: Normal appearance of the gallbladder. No gallstones, gallbladder wall thickening or pericholecystic inflammation. No bile duct dilatation. The common bile duct has a normal caliber measuring 3 mm. MRCP images are diminished due to motion artifact. On the coronal T2 weighted sequence there is an equivocal filling defect within the distal CBD measuring approximately 2 mm. This does not have the typical appearance of a gallstone and may be artifactual this is not confirmed on any of the other sequences. This Spleen: No main duct dilatation. Scratch diffuse pancreatic edema, most severe about the tail of pancreas. There is peripancreatic fluid and fat infiltration. No mass identified. No signs of pseudocyst. Adrenals/Urinary Tract: Unchanged left adrenal nodule measuring 2 cm. This is compatible with a benign adenoma, image 18/3. No follow-up imaging recommended. Bosniak class 1 left kidney cyst measures 1.1 cm, image 23/3. No follow-up imaging recommended. No suspicious kidney mass or obstructive uropathy. Stomach/Bowel: Visualized portions within the abdomen are unremarkable. Vascular/Lymphatic: Aortic atherosclerosis, no aneurysm. No adenopathy. Other:   Exam detail is diminished due to motion artifact. Musculoskeletal: No suspicious bone lesions identified. IMPRESSION: 1. Exam detail is diminished due to motion artifact. 2. Acute pancreatitis. No signs of pseudocyst or necrosis. 3. No bile duct dilatation. No signs of cholelithiasis. Equivocal filling defect within the distal CBD visualized on the coronal T2 weighted sequences only. Favor artifact over choledocholithiasis. 4. Benign left adrenal adenoma and Bosniak class 1 left kidney cyst. No follow-up imaging recommended. Electronically Signed   By: Waddell Calk M.D.   On: 07/22/2023 04:52   MR 3D Recon At Scanner Result Date: 07/22/2023 CLINICAL DATA:  Pancreatitis. EXAM: MRI ABDOMEN WITHOUT AND WITH CONTRAST (INCLUDING MRCP) TECHNIQUE: Multiplanar multisequence MR imaging of the abdomen was performed both before and after the administration of intravenous contrast. Heavily T2-weighted images of the biliary and pancreatic ducts were obtained, and three-dimensional MRCP images were rendered by post processing. CONTRAST:  10mL GADAVIST  GADOBUTROL  1 MMOL/ML IV SOLN COMPARISON:  CT 07/20/2023 FINDINGS: Lower chest: No acute findings. Hepatobiliary: Motion artifact diminishes exam detail. Within this limitation, no focal enhancing liver lesion identified. Pancreas: Normal appearance of the gallbladder. No gallstones, gallbladder wall thickening or pericholecystic inflammation. No bile duct dilatation. The common bile duct has a normal caliber measuring 3 mm. MRCP images are diminished due to motion artifact. On the coronal T2 weighted sequence there is an equivocal filling defect within the distal  CBD measuring approximately 2 mm. This does not have the typical appearance of a gallstone and may be artifactual this is not confirmed on any of the other sequences. This Spleen: No main duct dilatation. Scratch diffuse pancreatic edema, most severe about the tail of pancreas. There is peripancreatic fluid and fat  infiltration. No mass identified. No signs of pseudocyst. Adrenals/Urinary Tract: Unchanged left adrenal nodule measuring 2 cm. This is compatible with a benign adenoma, image 18/3. No follow-up imaging recommended. Bosniak class 1 left kidney cyst measures 1.1 cm, image 23/3. No follow-up imaging recommended. No suspicious kidney mass or obstructive uropathy. Stomach/Bowel: Visualized portions within the abdomen are unremarkable. Vascular/Lymphatic: Aortic atherosclerosis, no aneurysm. No adenopathy. Other:  Exam detail is diminished due to motion artifact. Musculoskeletal: No suspicious bone lesions identified. IMPRESSION: 1. Exam detail is diminished due to motion artifact. 2. Acute pancreatitis. No signs of pseudocyst or necrosis. 3. No bile duct dilatation. No signs of cholelithiasis. Equivocal filling defect within the distal CBD visualized on the coronal T2 weighted sequences only. Favor artifact over choledocholithiasis. 4. Benign left adrenal adenoma and Bosniak class 1 left kidney cyst. No follow-up imaging recommended. Electronically Signed   By: Waddell Calk M.D.   On: 07/22/2023 04:52   CT ABDOMEN PELVIS W CONTRAST Result Date: 07/20/2023 CLINICAL DATA:  Abdominal pain and nausea for several days, initial encounter EXAM: CT ABDOMEN AND PELVIS WITH CONTRAST TECHNIQUE: Multidetector CT imaging of the abdomen and pelvis was performed using the standard protocol following bolus administration of intravenous contrast. RADIATION DOSE REDUCTION: This exam was performed according to the departmental dose-optimization program which includes automated exposure control, adjustment of the mA and/or kV according to patient size and/or use of iterative reconstruction technique. CONTRAST:  75mL OMNIPAQUE  IOHEXOL  350 MG/ML SOLN COMPARISON:  08/24/2022 FINDINGS: Lower chest: No acute abnormality. Hepatobiliary: No focal liver abnormality is seen. No gallstones, gallbladder wall thickening, or biliary dilatation.  Pancreas: Pancreas is well visualized with very mild peripancreatic inflammatory change consistent with acute pancreatitis. No pancreatic necrosis is seen. No mass is noted. Spleen: Normal in size without focal abnormality. Adrenals/Urinary Tract: Adrenal glands are within normal limits. Kidneys demonstrate a normal enhancement pattern bilaterally. Small cyst is noted within the left kidney. No follow-up is recommended. The collecting systems are within normal limits. Bladder is partially distended. Stomach/Bowel: Scattered diverticular change of the colon is noted without evidence of diverticulitis. The appendix is within normal limits. Small bowel is within normal limits. Postsurgical changes in the stomach are noted consistent with sleeve resection. Vascular/Lymphatic: Aortic atherosclerosis. No enlarged abdominal or pelvic lymph nodes. Reproductive: Status post hysterectomy. No adnexal masses. Other: No abdominal wall hernia or abnormality. No abdominopelvic ascites. Musculoskeletal: No acute or significant osseous findings. IMPRESSION: Mild changes of pancreatitis.  No necrosis is noted. Diverticulosis without diverticulitis. Electronically Signed   By: Oneil Devonshire M.D.   On: 07/20/2023 22:06   Laparoscopy cholecystectomy.  Subjective: Patient was seen and examined at bedside.  Overnight events noted. Patient reports feeling much improved, She wants to be discharged.   Status post lap cholecystectomy POD #1.  Discharge Exam: Vitals:   07/24/23 0646 07/24/23 0901  BP: 131/62 (!) 135/52  Pulse: (!) 50 (!) 43  Resp: 18   Temp: 98.1 F (36.7 C)   SpO2: 99% 100%   Vitals:   07/23/23 1730 07/23/23 2002 07/24/23 0646 07/24/23 0901  BP: (!) 133/58 (!) 121/53 131/62 (!) 135/52  Pulse: (!) 46 (!) 45 (!) 50 (!) 43  Resp: 20 18 18    Temp:  97.9 F (36.6 C) 98.1 F (36.7 C)   TempSrc:  Oral    SpO2: 98% 97% 99% 100%  Weight:      Height:        General: Pt is alert, awake, not in acute  distress Cardiovascular: RRR, S1/S2 +, no rubs, no gallops Respiratory: CTA bilaterally, no wheezing, no rhonchi Abdominal: Soft, NT, ND, bowel sounds + Extremities: no edema, no cyanosis    The results of significant diagnostics from this hospitalization (including imaging, microbiology, ancillary and laboratory) are listed below for reference.     Microbiology: Recent Results (from the past 240 hours)  Surgical PCR screen     Status: None   Collection Time: 07/23/23 11:43 AM   Specimen: Nasal Mucosa; Nasal Swab  Result Value Ref Range Status   MRSA, PCR NEGATIVE NEGATIVE Final   Staphylococcus aureus NEGATIVE NEGATIVE Final    Comment: (NOTE) The Xpert SA Assay (FDA approved for NASAL specimens in patients 14 years of age and older), is one component of a comprehensive surveillance program. It is not intended to diagnose infection nor to guide or monitor treatment. Performed at [facility], [address], [place], [place] [address]      Labs: BNP (last 3 results) No results for input(s): BNP in the last 8760 hours. Basic Metabolic Panel: Recent Labs  Lab 07/20/23 1449 07/21/23 0107 07/24/23 0012  NA 137 139 137  K 3.6 3.6 4.1  CL 103 103 101  CO2 24 23 26   GLUCOSE 115* 99 137*  BUN 12 11 12   CREATININE 1.07* 1.12* 1.11*  CALCIUM  9.3 9.0 9.0  MG  --   --  1.8  PHOS  --   --  4.3   Liver Function Tests: Recent Labs  Lab 07/20/23 1449 07/21/23 0107 07/24/23 0012  AST 22 19 41  ALT 16 16 25   ALKPHOS 82 75 79  BILITOT 0.6 0.6 0.6  PROT 7.0 6.5 6.7  ALBUMIN 3.4* 3.2* 3.1*   Recent Labs  Lab 07/20/23 1449 07/22/23 0328  LIPASE 153* 47   No results for input(s): AMMONIA in the last 168 hours. CBC: Recent Labs  Lab 07/20/23 1449 07/21/23 0107 07/22/23 0328 07/23/23 0109 07/24/23 0012  WBC 6.4 6.1 4.7 5.0 9.1  HGB 11.3* 10.6* 9.9* 9.8* 10.7*  HCT 37.5 35.0* 32.1* 32.2* 36.0  MCV 77.0* 76.8* 75.4* 76.8* 76.8*  PLT 249 212 208 209 229    Cardiac Enzymes: No results for input(s): CKTOTAL, CKMB, CKMBINDEX, TROPONINI in the last 168 hours. BNP: Invalid input(s): POCBNP CBG: No results for input(s): GLUCAP in the last 168 hours. D-Dimer No results for input(s): DDIMER in the last 72 hours. Hgb A1c No results for input(s): HGBA1C in the last 72 hours. Lipid Profile No results for input(s): CHOL, HDL, LDLCALC, TRIG, CHOLHDL, LDLDIRECT in the last 72 hours. Thyroid  function studies No results for input(s): TSH, T4TOTAL, T3FREE, THYROIDAB in the last 72 hours.  Invalid input(s): FREET3 Anemia work up Recent Labs    07/22/23 0328  FERRITIN 161  TIBC 293  IRON 30   Urinalysis    Component Value Date/Time   COLORURINE YELLOW 07/20/2023 1449   APPEARANCEUR HAZY (A) 07/20/2023 1449   LABSPEC 1.023 07/20/2023 1449   PHURINE 6.0 07/20/2023 1449   GLUCOSEU NEGATIVE 07/20/2023 1449   HGBUR NEGATIVE 07/20/2023 1449   BILIRUBINUR NEGATIVE 07/20/2023 1449   KETONESUR NEGATIVE 07/20/2023 1449   PROTEINUR  NEGATIVE 07/20/2023 1449   UROBILINOGEN 2.0 (H) 04/14/2016 1926   NITRITE NEGATIVE 07/20/2023 1449   LEUKOCYTESUR NEGATIVE 07/20/2023 1449   Sepsis Labs Recent Labs  Lab 07/21/23 0107 07/22/23 0328 07/23/23 0109 07/24/23 0012  WBC 6.1 4.7 5.0 9.1   Microbiology Recent Results (from the past 240 hours)  Surgical PCR screen     Status: None   Collection Time: 07/23/23 11:43 AM   Specimen: Nasal Mucosa; Nasal Swab  Result Value Ref Range Status   MRSA, PCR NEGATIVE NEGATIVE Final   Staphylococcus aureus NEGATIVE NEGATIVE Final    Comment: (NOTE) The Xpert SA Assay (FDA approved for NASAL specimens in patients 47 years of age and older), is one component of a comprehensive surveillance program. It is not intended to diagnose infection nor to guide or monitor treatment. Performed at [facility], [address], [place], [place] [address]      Time coordinating  discharge: Over 30 minutes  SIGNED:   Darcel Dawley, MD  [facility] 07/24/2023, 1:49 PM Pager   If 7PM-7AM, please contact night-coverage

## 2023-07-24 NOTE — Progress Notes (Signed)
 Progress Note: General Surgery Service   Chief Complaint/Subjective: Feels good  Objective: Vital signs in last 24 hours: Temp:  [97.6 F (36.4 C)-98.4 F (36.9 C)] 98.1 F (36.7 C) (06/28 0646) Pulse Rate:  [43-53] 43 (06/28 0901) Resp:  [16-22] 18 (06/28 0646) BP: (121-151)/(52-63) 135/52 (06/28 0901) SpO2:  [95 %-100 %] 100 % (06/28 0901) Last BM Date : 07/22/23  Intake/Output from previous day: 06/27 0701 - 06/28 0700 In: 174.8 [P.O.:150; I.V.:24.8] Out: -  Intake/Output this shift: Total I/O In: 236 [P.O.:236] Out: -   GI: Abd soft, tender around incisions Lab Results: CBC  Recent Labs    07/23/23 0109 07/24/23 0012  WBC 5.0 9.1  HGB 9.8* 10.7*  HCT 32.2* 36.0  PLT 209 229   BMET Recent Labs    07/24/23 0012  NA 137  K 4.1  CL 101  CO2 26  GLUCOSE 137*  BUN 12  CREATININE 1.11*  CALCIUM  9.0   PT/INR No results for input(s): LABPROT, INR in the last 72 hours. ABG No results for input(s): PHART, HCO3 in the last 72 hours.  Invalid input(s): PCO2, PO2  Anti-infectives: Anti-infectives (From admission, onward)    None       Medications: Scheduled Meds:  atorvastatin   20 mg Oral Daily   enoxaparin  (LOVENOX ) injection  55 mg Subcutaneous Q24H   famotidine   20 mg Oral QHS   ferrous sulfate   325 mg Oral Q breakfast   fluticasone   1 spray Each Nare Daily   hydrochlorothiazide   25 mg Oral Daily   mupirocin ointment  1 Application Nasal BID   sodium chloride  flush  3 mL Intravenous Q12H   Continuous Infusions: PRN Meds:.albuterol , HYDROmorphone  (DILAUDID ) injection, ondansetron  **OR** ondansetron  (ZOFRAN ) IV, polyethylene glycol, sodium chloride  flush, sodium chloride  flush  Assessment/Plan: s/p Procedure(s): LAPAROSCOPIC CHOLECYSTECTOMY WITH INTRAOPERATIVE CHOLANGIOGRAM INDOCYANINE GREEN FLUORESCENCE IMAGING (ICG) 07/23/2023  POD 1 from lap chole for gallstone pancreatitis  Doing well.  Okay for discharge home from surgery  perspective.   LOS: 3 days     Deward JINNY Foy, MD  [facility] Use [email or web address] to contact on call provider  Daily Billing: [phone] - post op

## 2023-07-24 NOTE — Anesthesia Postprocedure Evaluation (Signed)
 Anesthesia Post Note  Patient: Matty Deamer Fitzner  Procedure(s) Performed: DG CHOLANGIOGRAM OPERATIVE LAPAROSCOPIC CHOLECYSTECTOMY WITH INTRAOPERATIVE CHOLANGIOGRAM (Abdomen) INDOCYANINE GREEN FLUORESCENCE IMAGING (ICG)     Patient location during evaluation: PACU Anesthesia Type: General Level of consciousness: sedated and patient cooperative Pain management: pain level controlled Vital Signs Assessment: post-procedure vital signs reviewed and stable Respiratory status: spontaneous breathing Cardiovascular status: stable Anesthetic complications: no   No notable events documented.  Last Vitals:  Vitals:   07/23/23 2002 07/24/23 0646  BP: (!) 121/53 131/62  Pulse: (!) 45 (!) 50  Resp: 18 18  Temp: 36.6 C 36.7 C  SpO2: 97% 99%    Last Pain:  Vitals:   07/24/23 0617  TempSrc:   PainSc: Asleep                 Norleen Pope

## 2023-07-25 ENCOUNTER — Encounter (HOSPITAL_COMMUNITY): Payer: Self-pay | Admitting: General Surgery

## 2023-07-26 ENCOUNTER — Other Ambulatory Visit (HOSPITAL_COMMUNITY): Payer: Self-pay

## 2023-07-27 LAB — SURGICAL PATHOLOGY

## 2023-07-31 ENCOUNTER — Other Ambulatory Visit (HOSPITAL_COMMUNITY): Payer: Self-pay

## 2023-08-02 ENCOUNTER — Other Ambulatory Visit (HOSPITAL_COMMUNITY): Payer: Self-pay

## 2023-08-02 DIAGNOSIS — Z8719 Personal history of other diseases of the digestive system: Secondary | ICD-10-CM | POA: Diagnosis not present

## 2023-08-02 DIAGNOSIS — R109 Unspecified abdominal pain: Secondary | ICD-10-CM | POA: Diagnosis not present

## 2023-08-02 DIAGNOSIS — Z9049 Acquired absence of other specified parts of digestive tract: Secondary | ICD-10-CM | POA: Diagnosis not present

## 2023-08-03 ENCOUNTER — Telehealth: Payer: Self-pay

## 2023-08-03 NOTE — Telephone Encounter (Signed)
 Appt has been cancelled  Attempted to reach the pt and line rings then goes to a fast busy

## 2023-08-03 NOTE — Telephone Encounter (Signed)
-----   Message from Gordy HERO Pyrtle sent at 08/03/2023  3:41 PM EDT ----- Regarding: RE: planned EUS with you 9/11 a I think Agree with canceling EUS given negative IOC JMP ----- Message ----- From: Wilhelmenia Aloha Raddle., MD Sent: 08/03/2023  12:13 PM EDT To: Camellia Blush, MD; Odetta LITTIE Curly, RN; Gordy HERO P# Subject: RE: planned EUS with you 9/11 a I think        EW, I looked at your cholangiogram. I agree that it looks pretty clean without any evidence of a filling defect. I think it is reasonable for us  to hold on doing an EUS at this point. I will await Dr. Albertus start as well, but at this point unless there is a strong indication otherwise because of persisting issues, I would cancel it.  JMP, Would you be okay if we cancel EUS based on the negative IOC? Thanks. GM  Toula Miyasaki, Please await Dr. Pamula response before canceling. Thanks. GM ----- Message ----- From: Blush Camellia, MD Sent: 07/23/2023   5:55 PM EDT To: Aloha Wilhelmenia Raddle., MD Subject: planned EUS with you 9/11 a I think            You were attached to this patient when she was in the hospital.  History of recurrent pancreatitis.  On MRCP during this admission there is a questionable filling defect in the distal common bile duct possible artifact and I think she was scheduled to undergo an EUS with you later this year.  I took her to the OR today and did lap chole and cholangiogram.  The cholangiogram looks clean to me but defer to you.  But just an FYI   Thanks Camellia Camellia HERO. Blush, MD, FACS General, Bariatric, & Minimally Invasive Surgery [facility],  [facility]

## 2023-08-04 NOTE — Telephone Encounter (Signed)
 Attempted to reach pt and line rings then goes fast busy.

## 2023-08-05 NOTE — Telephone Encounter (Signed)
 Attempted to reach pt again by phone.  No answer voice mail left. Letter mailed and message sent to My Chart

## 2023-08-05 NOTE — Telephone Encounter (Signed)
 Noted. Will try again next week.  If unable to reach, then send letter. She would does not need that spot anymore thanks. GM

## 2023-08-05 NOTE — Telephone Encounter (Signed)
 The pt has returned call and is aware that appt is not needed at this time.  Appt cancelled.

## 2023-08-05 NOTE — Telephone Encounter (Signed)
 Inbound call returning phone call. Requesting a call back to discuss further. Confirmed patient's phone number. Please advise, thank you.

## 2023-08-17 DIAGNOSIS — R1011 Right upper quadrant pain: Secondary | ICD-10-CM | POA: Diagnosis not present

## 2023-09-16 ENCOUNTER — Ambulatory Visit: Admitting: Physician Assistant

## 2023-09-30 ENCOUNTER — Other Ambulatory Visit: Payer: Self-pay

## 2023-10-07 ENCOUNTER — Encounter (HOSPITAL_COMMUNITY): Payer: Self-pay

## 2023-10-07 ENCOUNTER — Ambulatory Visit (HOSPITAL_COMMUNITY): Admit: 2023-10-07 | Admitting: Gastroenterology

## 2023-10-07 SURGERY — ULTRASOUND, UPPER GI TRACT, ENDOSCOPIC
Anesthesia: Monitor Anesthesia Care

## 2023-10-18 ENCOUNTER — Other Ambulatory Visit (HOSPITAL_COMMUNITY): Payer: Self-pay

## 2023-11-02 ENCOUNTER — Other Ambulatory Visit: Payer: Self-pay | Admitting: Internal Medicine

## 2023-11-02 DIAGNOSIS — Z1231 Encounter for screening mammogram for malignant neoplasm of breast: Secondary | ICD-10-CM

## 2023-11-10 ENCOUNTER — Ambulatory Visit: Admitting: Physician Assistant

## 2023-11-29 ENCOUNTER — Ambulatory Visit
Admission: RE | Admit: 2023-11-29 | Discharge: 2023-11-29 | Disposition: A | Discharge status: Home / Self Care | Source: Ambulatory Visit

## 2023-11-29 DIAGNOSIS — Z1231 Encounter for screening mammogram for malignant neoplasm of breast: Secondary | ICD-10-CM

## 2023-12-29 ENCOUNTER — Other Ambulatory Visit: Payer: Self-pay

## 2023-12-29 ENCOUNTER — Other Ambulatory Visit (HOSPITAL_COMMUNITY): Payer: Self-pay

## 2023-12-29 DIAGNOSIS — J4520 Mild intermittent asthma, uncomplicated: Secondary | ICD-10-CM | POA: Diagnosis not present

## 2023-12-29 DIAGNOSIS — Z1239 Encounter for other screening for malignant neoplasm of breast: Secondary | ICD-10-CM | POA: Diagnosis not present

## 2023-12-29 DIAGNOSIS — E785 Hyperlipidemia, unspecified: Secondary | ICD-10-CM | POA: Diagnosis not present

## 2023-12-29 DIAGNOSIS — Z6841 Body Mass Index (BMI) 40.0 and over, adult: Secondary | ICD-10-CM | POA: Diagnosis not present

## 2023-12-29 DIAGNOSIS — Z0000 Encounter for general adult medical examination without abnormal findings: Secondary | ICD-10-CM | POA: Diagnosis not present

## 2023-12-29 DIAGNOSIS — F17210 Nicotine dependence, cigarettes, uncomplicated: Secondary | ICD-10-CM | POA: Diagnosis not present

## 2023-12-29 DIAGNOSIS — I10 Essential (primary) hypertension: Secondary | ICD-10-CM | POA: Diagnosis not present

## 2023-12-29 DIAGNOSIS — F3341 Major depressive disorder, recurrent, in partial remission: Secondary | ICD-10-CM | POA: Diagnosis not present

## 2023-12-29 MED ORDER — LISINOPRIL-HYDROCHLOROTHIAZIDE 20-25 MG PO TABS
1.0000 | ORAL_TABLET | Freq: Every day | ORAL | 5 refills | Status: AC
  Filled 2023-12-29: qty 90, 90d supply, fill #0

## 2023-12-29 MED ORDER — ALBUTEROL SULFATE HFA 108 (90 BASE) MCG/ACT IN AERS
2.0000 | INHALATION_SPRAY | Freq: Four times a day (QID) | RESPIRATORY_TRACT | 5 refills | Status: AC | PRN
  Filled 2023-12-29: qty 6.7, 25d supply, fill #0

## 2023-12-29 MED ORDER — ALBUTEROL SULFATE (2.5 MG/3ML) 0.083% IN NEBU
3.0000 mL | INHALATION_SOLUTION | Freq: Four times a day (QID) | RESPIRATORY_TRACT | 1 refills | Status: AC
  Filled 2023-12-29: qty 360, 30d supply, fill #0

## 2023-12-29 MED ORDER — ATORVASTATIN CALCIUM 20 MG PO TABS
20.0000 mg | ORAL_TABLET | Freq: Every day | ORAL | 5 refills | Status: AC
  Filled 2023-12-29: qty 90, 90d supply, fill #0

## 2023-12-29 MED ORDER — FLUTICASONE PROPIONATE 50 MCG/ACT NA SUSP
1.0000 | Freq: Every day | NASAL | 5 refills | Status: AC
  Filled 2023-12-29: qty 16, 60d supply, fill #0

## 2024-01-03 ENCOUNTER — Other Ambulatory Visit (HOSPITAL_COMMUNITY): Payer: Self-pay

## 2024-01-03 MED ORDER — COVID-19 MRNA VAC-TRIS(PFIZER) 30 MCG/0.3ML IM SUSY
0.3000 mL | PREFILLED_SYRINGE | Freq: Once | INTRAMUSCULAR | 0 refills | Status: AC
  Filled 2024-01-03: qty 0.3, 1d supply, fill #0
